# Patient Record
Sex: Female | Born: 1980 | Race: Black or African American | Hispanic: No | Marital: Married | Smoking: Former smoker
Health system: Southern US, Community
[De-identification: ages and names within clinical notes are randomized; demographics above are authoritative.]

## PROBLEM LIST (undated history)

## (undated) DIAGNOSIS — M6282 Rhabdomyolysis: Secondary | ICD-10-CM

## (undated) DIAGNOSIS — R42 Dizziness and giddiness: Secondary | ICD-10-CM

## (undated) DIAGNOSIS — I10 Essential (primary) hypertension: Secondary | ICD-10-CM

## (undated) HISTORY — DX: Rhabdomyolysis: M62.82

---

## 2006-03-22 ENCOUNTER — Other Ambulatory Visit: Admission: RE | Admit: 2006-03-22 | Discharge: 2006-03-22 | Payer: Self-pay | Admitting: Obstetrics and Gynecology

## 2006-05-22 ENCOUNTER — Emergency Department (HOSPITAL_COMMUNITY): Admission: EM | Admit: 2006-05-22 | Discharge: 2006-05-22 | Payer: Self-pay | Admitting: Emergency Medicine

## 2006-07-03 ENCOUNTER — Emergency Department (HOSPITAL_COMMUNITY): Admission: EM | Admit: 2006-07-03 | Discharge: 2006-07-03 | Payer: Self-pay | Admitting: *Deleted

## 2006-07-12 ENCOUNTER — Ambulatory Visit: Payer: Self-pay | Admitting: Internal Medicine

## 2006-07-17 ENCOUNTER — Ambulatory Visit: Payer: Self-pay | Admitting: Internal Medicine

## 2006-07-24 ENCOUNTER — Ambulatory Visit: Payer: Self-pay | Admitting: Gastroenterology

## 2006-12-20 ENCOUNTER — Emergency Department (HOSPITAL_COMMUNITY): Admission: EM | Admit: 2006-12-20 | Discharge: 2006-12-20 | Payer: Self-pay | Admitting: Emergency Medicine

## 2007-12-31 ENCOUNTER — Emergency Department (HOSPITAL_COMMUNITY): Admission: EM | Admit: 2007-12-31 | Discharge: 2007-12-31 | Payer: Self-pay | Admitting: Emergency Medicine

## 2008-02-14 ENCOUNTER — Emergency Department (HOSPITAL_COMMUNITY): Admission: EM | Admit: 2008-02-14 | Discharge: 2008-02-14 | Payer: Self-pay | Admitting: Emergency Medicine

## 2009-02-14 ENCOUNTER — Emergency Department (HOSPITAL_COMMUNITY): Admission: EM | Admit: 2009-02-14 | Discharge: 2009-02-14 | Payer: Self-pay | Admitting: Emergency Medicine

## 2009-03-09 ENCOUNTER — Emergency Department (HOSPITAL_COMMUNITY): Admission: EM | Admit: 2009-03-09 | Discharge: 2009-03-09 | Payer: Self-pay | Admitting: Emergency Medicine

## 2009-05-09 ENCOUNTER — Emergency Department (HOSPITAL_COMMUNITY): Admission: EM | Admit: 2009-05-09 | Discharge: 2009-05-09 | Payer: Self-pay | Admitting: Emergency Medicine

## 2009-05-11 ENCOUNTER — Emergency Department (HOSPITAL_COMMUNITY): Admission: EM | Admit: 2009-05-11 | Discharge: 2009-05-11 | Payer: Self-pay | Admitting: Emergency Medicine

## 2010-07-28 ENCOUNTER — Emergency Department (HOSPITAL_COMMUNITY): Admission: EM | Admit: 2010-07-28 | Discharge: 2010-07-28 | Payer: Self-pay | Admitting: Emergency Medicine

## 2010-10-18 ENCOUNTER — Emergency Department (HOSPITAL_COMMUNITY): Admission: EM | Admit: 2010-10-18 | Discharge: 2010-10-18 | Payer: Self-pay | Admitting: Emergency Medicine

## 2010-12-12 ENCOUNTER — Emergency Department (HOSPITAL_COMMUNITY)
Admission: EM | Admit: 2010-12-12 | Discharge: 2010-12-12 | Payer: Self-pay | Source: Home / Self Care | Admitting: Emergency Medicine

## 2010-12-18 LAB — URINALYSIS, ROUTINE W REFLEX MICROSCOPIC
Bilirubin Urine: NEGATIVE
Hgb urine dipstick: NEGATIVE
Ketones, ur: NEGATIVE mg/dL
Leukocytes, UA: NEGATIVE
Nitrite: NEGATIVE
Protein, ur: NEGATIVE mg/dL
Specific Gravity, Urine: 1.025 (ref 1.005–1.030)
Urine Glucose, Fasting: NEGATIVE mg/dL
Urobilinogen, UA: 1 mg/dL (ref 0.0–1.0)
pH: 7.5 (ref 5.0–8.0)

## 2010-12-18 LAB — DIFFERENTIAL
Basophils Absolute: 0 10*3/uL (ref 0.0–0.1)
Basophils Relative: 0 % (ref 0–1)
Eosinophils Absolute: 0.1 10*3/uL (ref 0.0–0.7)
Eosinophils Relative: 2 % (ref 0–5)
Lymphocytes Relative: 46 % (ref 12–46)
Lymphs Abs: 2.1 10*3/uL (ref 0.7–4.0)
Monocytes Absolute: 0.3 10*3/uL (ref 0.1–1.0)
Monocytes Relative: 7 % (ref 3–12)
Neutro Abs: 2.1 10*3/uL (ref 1.7–7.7)
Neutrophils Relative %: 46 % (ref 43–77)

## 2010-12-18 LAB — COMPREHENSIVE METABOLIC PANEL
ALT: 17 U/L (ref 0–35)
AST: 19 U/L (ref 0–37)
Albumin: 3.7 g/dL (ref 3.5–5.2)
Alkaline Phosphatase: 58 U/L (ref 39–117)
BUN: 7 mg/dL (ref 6–23)
CO2: 28 mEq/L (ref 19–32)
Calcium: 9.2 mg/dL (ref 8.4–10.5)
Chloride: 106 mEq/L (ref 96–112)
Creatinine, Ser: 0.75 mg/dL (ref 0.4–1.2)
GFR calc Af Amer: >60 mL/min (ref >60)
GFR calc non Af Amer: >60 mL/min (ref >60)
Glucose, Bld: 80 mg/dL (ref 70–99)
Potassium: 4 mEq/L (ref 3.5–5.1)
Sodium: 139 mEq/L (ref 135–145)
Total Bilirubin: 0.4 mg/dL (ref 0.3–1.2)
Total Protein: 7.3 g/dL (ref 6.0–8.3)

## 2010-12-18 LAB — CBC
HCT: 37.9 % (ref 36.0–46.0)
Hemoglobin: 12.5 g/dL (ref 12.0–15.0)
MCH: 31.8 pg (ref 26.0–34.0)
MCHC: 33 g/dL (ref 30.0–36.0)
MCV: 96.4 fL (ref 78.0–100.0)
Platelets: 190 10*3/uL (ref 150–400)
RBC: 3.93 MIL/uL (ref 3.87–5.11)
RDW: 13 % (ref 11.5–15.5)
WBC: 4.6 10*3/uL (ref 4.0–10.5)

## 2010-12-18 LAB — URINE MICROSCOPIC-ADD ON

## 2010-12-18 LAB — POCT PREGNANCY, URINE: Preg Test, Ur: NEGATIVE

## 2011-02-13 LAB — DIFFERENTIAL
Basophils Absolute: 0 10*3/uL (ref 0.0–0.1)
Basophils Relative: 1 % (ref 0–1)
Eosinophils Absolute: 0.1 10*3/uL (ref 0.0–0.7)
Eosinophils Relative: 3 % (ref 0–5)
Lymphocytes Relative: 55 % — ABNORMAL HIGH (ref 12–46)
Lymphs Abs: 1.7 10*3/uL (ref 0.7–4.0)
Monocytes Absolute: 0.4 10*3/uL (ref 0.1–1.0)
Monocytes Relative: 12 % (ref 3–12)
Neutro Abs: 0.9 10*3/uL — ABNORMAL LOW (ref 1.7–7.7)
Neutrophils Relative %: 29 % — ABNORMAL LOW (ref 43–77)

## 2011-02-13 LAB — URINALYSIS, ROUTINE W REFLEX MICROSCOPIC
Bilirubin Urine: NEGATIVE
Glucose, UA: NEGATIVE mg/dL
Ketones, ur: NEGATIVE mg/dL
Leukocytes, UA: NEGATIVE
Nitrite: NEGATIVE
Protein, ur: NEGATIVE mg/dL
Specific Gravity, Urine: 1.021 (ref 1.005–1.030)
Urobilinogen, UA: 1 mg/dL (ref 0.0–1.0)
pH: 6 (ref 5.0–8.0)

## 2011-02-13 LAB — CBC
HCT: 35.9 % — ABNORMAL LOW (ref 36.0–46.0)
Hemoglobin: 12.5 g/dL (ref 12.0–15.0)
MCH: 33 pg (ref 26.0–34.0)
MCHC: 34.7 g/dL (ref 30.0–36.0)
MCV: 95 fL (ref 78.0–100.0)
Platelets: 193 10*3/uL (ref 150–400)
RBC: 3.78 MIL/uL — ABNORMAL LOW (ref 3.87–5.11)
RDW: 13 % (ref 11.5–15.5)
WBC: 3.1 10*3/uL — ABNORMAL LOW (ref 4.0–10.5)

## 2011-02-13 LAB — POCT I-STAT, CHEM 8
BUN: 8 mg/dL (ref 6–23)
Calcium, Ion: 1.13 mmol/L (ref 1.12–1.32)
Chloride: 105 mEq/L (ref 96–112)
Creatinine, Ser: 0.8 mg/dL (ref 0.4–1.2)
Glucose, Bld: 89 mg/dL (ref 70–99)
HCT: 40 % (ref 36.0–46.0)
Hemoglobin: 13.6 g/dL (ref 12.0–15.0)
Potassium: 5.4 mEq/L — ABNORMAL HIGH (ref 3.5–5.1)
Sodium: 140 mEq/L (ref 135–145)
TCO2: 29 mmol/L (ref 0–100)

## 2011-02-13 LAB — URINE MICROSCOPIC-ADD ON

## 2011-02-13 LAB — PREGNANCY, URINE: Preg Test, Ur: NEGATIVE

## 2011-02-15 ENCOUNTER — Emergency Department (HOSPITAL_COMMUNITY)
Admission: EM | Admit: 2011-02-15 | Discharge: 2011-02-15 | Disposition: A | Discharge status: Home / Self Care | Payer: BC Managed Care – PPO | Attending: Emergency Medicine | Admitting: Emergency Medicine

## 2011-02-15 ENCOUNTER — Emergency Department (HOSPITAL_COMMUNITY): Payer: BC Managed Care – PPO

## 2011-02-15 DIAGNOSIS — M25569 Pain in unspecified knee: Secondary | ICD-10-CM | POA: Insufficient documentation

## 2011-02-15 DIAGNOSIS — D649 Anemia, unspecified: Secondary | ICD-10-CM | POA: Insufficient documentation

## 2011-02-15 DIAGNOSIS — Y92009 Unspecified place in unspecified non-institutional (private) residence as the place of occurrence of the external cause: Secondary | ICD-10-CM | POA: Insufficient documentation

## 2011-02-15 DIAGNOSIS — X500XXA Overexertion from strenuous movement or load, initial encounter: Secondary | ICD-10-CM | POA: Insufficient documentation

## 2011-03-12 LAB — URINALYSIS, ROUTINE W REFLEX MICROSCOPIC
Bilirubin Urine: NEGATIVE
Glucose, UA: NEGATIVE mg/dL
Hgb urine dipstick: NEGATIVE
Ketones, ur: NEGATIVE mg/dL
Nitrite: NEGATIVE
Protein, ur: NEGATIVE mg/dL
Specific Gravity, Urine: 1.017 (ref 1.005–1.030)
Urobilinogen, UA: 1 mg/dL (ref 0.0–1.0)
pH: 7.5 (ref 5.0–8.0)

## 2011-03-12 LAB — PREGNANCY, URINE: Preg Test, Ur: NEGATIVE

## 2011-03-12 LAB — WET PREP, GENITAL
Trich, Wet Prep: NONE SEEN
Yeast Wet Prep HPF POC: NONE SEEN

## 2011-03-12 LAB — GC/CHLAMYDIA PROBE AMP, GENITAL
Chlamydia, DNA Probe: NEGATIVE
GC Probe Amp, Genital: NEGATIVE

## 2011-03-12 LAB — HCG, QUANTITATIVE, PREGNANCY: hCG, Beta Chain, Quant, S: <2 m[IU]/mL (ref <5)

## 2011-03-14 LAB — RPR: RPR Ser Ql: NONREACTIVE

## 2011-03-14 LAB — GC/CHLAMYDIA PROBE AMP, GENITAL
Chlamydia, DNA Probe: NEGATIVE
GC Probe Amp, Genital: NEGATIVE

## 2011-03-14 LAB — URINALYSIS, ROUTINE W REFLEX MICROSCOPIC
Bilirubin Urine: NEGATIVE
Glucose, UA: NEGATIVE mg/dL
Hgb urine dipstick: NEGATIVE
Ketones, ur: NEGATIVE mg/dL
Nitrite: NEGATIVE
Protein, ur: NEGATIVE mg/dL
Specific Gravity, Urine: 1.022 (ref 1.005–1.030)
Urobilinogen, UA: 1 mg/dL (ref 0.0–1.0)
pH: 7 (ref 5.0–8.0)

## 2011-03-14 LAB — POCT PREGNANCY, URINE: Preg Test, Ur: NEGATIVE

## 2011-03-14 LAB — WET PREP, GENITAL
Trich, Wet Prep: NONE SEEN
Yeast Wet Prep HPF POC: NONE SEEN

## 2011-04-20 NOTE — Assessment & Plan Note (Signed)
[facility]                           GASTROENTEROLOGY OFFICE NOTE   Kayla Adkins, Kayla Adkins                      MRN:          [id number]  DATE:07/13/2006                            DOB:          1981-10-02    REFERRING PHYSICIAN:  Celene Kras, MD   ASSESSMENT:  Recurrent vomiting and postprandial epigastric pain x6 to 8  weeks. Etiology is not clear. Pregnancy is excluded with a negative  pregnancy test. Laboratory studies on July 03, 2006 are unrevealing except  for a slight anemia with a hemoglobin of 11.7. Her B-met was normal. White  count was normal. Her urinalysis was negative. Etiologies include peptic  ulcer disease, gallbladder disease in the differential but she has more of  an immediate postprandial vomiting problem, she had similar problems 2 years  ago but she had bloody diarrhea at the time. She was admitted at [facility] in [place] and apparently had a CT scan and was told she  had a bleeding ulcer, though she never had endoscopy. We will request those  records.   PLAN:  1. Schedule upper GI endoscopy.  2. Check hepatic function panel, amylase, and lipase today as pancreatitis      and biliary disease are in the differential.  3. I have explained the recommendations and plans to the patient and she      understands the risks, benefits, and indications of upper endoscopy.  4. She has been given a prescription for Omeprazole 20 mg daily but has      not started it. I told her to go ahead and start that.   HISTORY OF PRESENT ILLNESS:  This 29 year old African-American woman has  been sick for about 6 to 8 weeks. Every time she eats, she develops nausea  and abdominal cramping. There is epigastric pain. There is some right sided  abdominal pain in the lower quadrant as well. She has had some loose stools  lately without significant urgency or nocturnal symptoms. There may have  been some fever and night sweats, though  it is not entirely clear. She has  not had any weight loss or gain. She has been afraid to eat because of the  symptoms. Laboratory testing in the emergency department as above on July 03, 2006. She was Hemoccult negative at that time as well. She was diagnosed  as having a gastritis.   MEDICATIONS:  As above. She has also been given promethazine 25 mg as  needed.   PAST MEDICAL HISTORY:  As above. She has also had a cesarean section.   FAMILY HISTORY:  Grandparents have diabetes and heart disease. There is no  GI problems reported.   SOCIAL HISTORY:  She is single. She has 1 son and 2 daughters. Occasional  alcohol.   REVIEW OF SYSTEMS:  As above. She has had some headaches, though these  started after these symptoms and are not related. Though, they are  associated with some of them, they do not perceive this problem. There is  some back pain, some insomnia.   PHYSICAL EXAMINATION:  GENERAL:  Examination reveals  an overweight to obese  young black woman in no acute distress.  VITAL SIGNS:  Height 5 foot 3. Weight 154 pounds. Pulse 60. Blood pressure  118/62.  HEENT:  Eyes anicteric. ENT, normal posterior pharynx.  NECK:  Supple without mass.  CHEST:  Clear.  HEART:  S1 and S2 without murmur, rub, or gallop.  ABDOMEN:  Soft, nontender, without organomegaly or mass.  RECTAL:  Examination in the presence of female nursing staff demonstrates  heme negative, formed brown stool. No mass.  EXTREMITIES:  No edema.  SKIN:  No rash.  NEUROLOGIC:  Alert and oriented times three.   I have reviewed the emergency department records on line in e-chart, as well  as the laboratory testing as described above.   I appreciate the opportunity to care for this patient.                                   Iva Boop, MD, [facility]   CEG/MedQ  DD:  07/13/2006  DT:  07/13/2006  Job #:  [id number]   cc:   Celene Kras, MD

## 2011-04-20 NOTE — Procedures (Signed)
[facility]                            ABDOMINAL ULTRASOUND REPORT   ANTARA, BRECHEISEN                      MRN:          [id number]  DATE:07/26/2006                            DOB:          1981-06-04    ACCESSION:  # [id number]   READING PHYSICIAN:  Vania Rea. Jarold Motto, MD, Clementeen Graham, FACP   PROCEDURE:  Multiplanar abdominal ultrasound imaging was performed in the  upright, supine, right and left lateral decubitus positions.   RESULTS:  The abdominal aorta appears normal.  The maximal diameter is 1.8  cm.  The IVC is patent.   The pancreas is normal.   The gallbladder has normal wall thickness of 2.2 mm.  Bile duct normal.  Diameter is 3.8 mm.  The liver is normal.   The kidneys are normal, the right is 9 cm and left 9.8 cm.   The spleen is normal at 9 cm.   ASSESSMENT:  This is technically an excellent sonogram examination.  There  is no evidence of cholelithiasis or biliary ductal dilatation.  The liver,  pancreas and kidneys are well-imaged and all appeared entirely normal.                                   Vania Rea. Jarold Motto, MD, Clementeen Graham, [place]   DRP/MedQ  DD:  07/26/2006  DT:  07/26/2006  Job #:  [id number]

## 2011-08-23 LAB — URINALYSIS, ROUTINE W REFLEX MICROSCOPIC
Glucose, UA: NEGATIVE
pH: 6.5

## 2012-07-07 ENCOUNTER — Encounter (HOSPITAL_COMMUNITY): Payer: Self-pay | Admitting: *Deleted

## 2012-07-07 ENCOUNTER — Emergency Department (HOSPITAL_COMMUNITY)
Admission: EM | Admit: 2012-07-07 | Discharge: 2012-07-07 | Disposition: A | Discharge status: Home / Self Care | Payer: Self-pay | Attending: Emergency Medicine | Admitting: Emergency Medicine

## 2012-07-07 DIAGNOSIS — F17200 Nicotine dependence, unspecified, uncomplicated: Secondary | ICD-10-CM | POA: Insufficient documentation

## 2012-07-07 DIAGNOSIS — A599 Trichomoniasis, unspecified: Secondary | ICD-10-CM | POA: Insufficient documentation

## 2012-07-07 DIAGNOSIS — R197 Diarrhea, unspecified: Secondary | ICD-10-CM | POA: Insufficient documentation

## 2012-07-07 DIAGNOSIS — R42 Dizziness and giddiness: Secondary | ICD-10-CM | POA: Insufficient documentation

## 2012-07-07 LAB — URINALYSIS, ROUTINE W REFLEX MICROSCOPIC
Nitrite: NEGATIVE
Specific Gravity, Urine: 1.033 — ABNORMAL HIGH (ref 1.005–1.030)
pH: 6.5 (ref 5.0–8.0)

## 2012-07-07 LAB — COMPREHENSIVE METABOLIC PANEL
AST: 18 U/L (ref 0–37)
Albumin: 4.1 g/dL (ref 3.5–5.2)
Chloride: 103 mEq/L (ref 96–112)
Creatinine, Ser: 0.73 mg/dL (ref 0.50–1.10)
Potassium: 3.9 mEq/L (ref 3.5–5.1)
Total Bilirubin: 0.3 mg/dL (ref 0.3–1.2)

## 2012-07-07 LAB — LIPASE, BLOOD: Lipase: 23 U/L (ref 11–59)

## 2012-07-07 LAB — CBC WITH DIFFERENTIAL/PLATELET
Basophils Absolute: 0 10*3/uL (ref 0.0–0.1)
Basophils Relative: 0 % (ref 0–1)
Eosinophils Absolute: 0 10*3/uL (ref 0.0–0.7)
Eosinophils Relative: 1 % (ref 0–5)
HCT: 37.3 % (ref 36.0–46.0)
Hemoglobin: 12.6 g/dL (ref 12.0–15.0)
Lymphocytes Relative: 52 % — ABNORMAL HIGH (ref 12–46)
Lymphs Abs: 2.2 10*3/uL (ref 0.7–4.0)
MCH: 31.9 pg (ref 26.0–34.0)
MCHC: 33.8 g/dL (ref 30.0–36.0)
MCV: 94.4 fL (ref 78.0–100.0)
Monocytes Absolute: 0.3 10*3/uL (ref 0.1–1.0)
Monocytes Relative: 7 % (ref 3–12)
Neutro Abs: 1.7 10*3/uL (ref 1.7–7.7)
Neutrophils Relative %: 41 % — ABNORMAL LOW (ref 43–77)
Platelets: 199 10*3/uL (ref 150–400)
RBC: 3.95 MIL/uL (ref 3.87–5.11)
RDW: 12.7 % (ref 11.5–15.5)
WBC: 4.3 10*3/uL (ref 4.0–10.5)

## 2012-07-07 LAB — WET PREP, GENITAL: Yeast Wet Prep HPF POC: NONE SEEN

## 2012-07-07 LAB — POCT PREGNANCY, URINE: Preg Test, Ur: NEGATIVE

## 2012-07-07 LAB — URINE MICROSCOPIC-ADD ON

## 2012-07-07 MED ORDER — MECLIZINE HCL 50 MG PO TABS: 25.0000 mg | ORAL_TABLET | Freq: Three times a day (TID) | ORAL | Status: AC | PRN

## 2012-07-07 MED ORDER — AZITHROMYCIN 250 MG PO TABS
1000.0000 mg | ORAL_TABLET | Freq: Once | ORAL | Status: AC
  Administered 2012-07-07: 1000 mg via ORAL
  Filled 2012-07-07: qty 4

## 2012-07-07 MED ORDER — SODIUM CHLORIDE 0.9 % IV BOLUS (SEPSIS)
1000.0000 mL | Freq: Once | INTRAVENOUS | Status: AC
  Administered 2012-07-07: 1000 mL via INTRAVENOUS

## 2012-07-07 MED ORDER — CEFTRIAXONE SODIUM 250 MG IJ SOLR
250.0000 mg | Freq: Once | INTRAMUSCULAR | Status: AC
  Administered 2012-07-07: 250 mg via INTRAMUSCULAR
  Filled 2012-07-07: qty 250

## 2012-07-07 MED ORDER — ONDANSETRON HCL 4 MG/2ML IJ SOLN
4.0000 mg | Freq: Once | INTRAMUSCULAR | Status: AC
  Administered 2012-07-07: 4 mg via INTRAVENOUS
  Filled 2012-07-07: qty 2

## 2012-07-07 MED ORDER — LOPERAMIDE HCL 2 MG PO CAPS: 2.0000 mg | ORAL_CAPSULE | Freq: Four times a day (QID) | ORAL | Status: AC | PRN

## 2012-07-07 MED ORDER — MECLIZINE HCL 25 MG PO TABS
25.0000 mg | ORAL_TABLET | Freq: Once | ORAL | Status: AC
  Administered 2012-07-07: 25 mg via ORAL
  Filled 2012-07-07: qty 1

## 2012-07-07 MED ORDER — METRONIDAZOLE 500 MG PO TABS
2000.0000 mg | ORAL_TABLET | Freq: Once | ORAL | Status: AC
  Administered 2012-07-07: 2000 mg via ORAL
  Filled 2012-07-07: qty 4

## 2012-07-07 NOTE — ED Notes (Signed)
Pt reports dizziness and diarrhea x3 weeks. Diarrhea 4-5x a day. Decreased intake, dry mouth.

## 2012-07-07 NOTE — Progress Notes (Signed)
[facility] ED CM spoke with pt when noted no pcp Pt confirms she has no pcp Cm reviewed with pt how to get an in network pcp through [employer] or toll free number Pt states [employer] through husband whom she no longer lives with.  Pt reports having medicaid previously and is attempting to re apply for medicaid again by end of year Pt voiced appreciation and understanding of information reviewed

## 2012-07-07 NOTE — ED Notes (Signed)
Pt states for past 3 weeks has been having dizziness, diarrhea (5 times a day), nausea and vomiting (states more like spit). Pt states having abdominal pain, 7/10, states feels like she has a knot in upper abdominal area, then on R side feels "thumping".

## 2012-07-07 NOTE — ED Provider Notes (Signed)
History     CSN: [id number]  Arrival date & time 07/07/12  1303   First MD Initiated Contact with Patient 07/07/12 1420      Chief Complaint  Patient presents with  . Dizziness  . Diarrhea    (Consider location/radiation/quality/duration/timing/severity/associated sxs/prior treatment) HPI  31 year old female with no significant medical history presents complaining of diarrhea.patient reports for the past 3 weeks she has been having persistent diarrhea. Initially she was experiencing easiness with sensation of room spinning around. She was nauseated and vomits a few times. Since then she has been having 4-5 bouts of non bloody, non mucus diarrhea daily.  She has decreased in appetite.  Has occasional epigastric abd tenderness, which usually improves when she pressed on it.  Has not tried anything to alleviate her sxs because "i don't like to take medicine".  She also report her LMP is in May and has missed her last few menstruations.  Does not think she's pregnant. Is sexually active only to her husband.  Denies fever, chills, cp, sob, back pain, or urinary sxs.  Denies vaginal discharge or rash.  Denies any recent abx use or recent travel.    History reviewed. No pertinent past medical history.  Past Surgical History  Procedure Date  . Cesarean section     No family history on file.  History  Substance Use Topics  . Smoking status: Current Some Day Smoker  . Smokeless tobacco: Not on file  . Alcohol Use: Yes     sometimes    OB History    Grav Para Term Preterm Abortions TAB SAB Ect Mult Living                  Review of Systems  All other systems reviewed and are negative.    Allergies  Review of patient's allergies indicates no known allergies.  Home Medications  No current outpatient prescriptions on file.  LMP 04/25/2012  Physical Exam  Nursing note and vitals reviewed. Constitutional: She is oriented to person, place, and time. She appears well-developed  and well-nourished. No distress.       Awake, alert, nontoxic appearance  HENT:  Head: Atraumatic.  Eyes: Conjunctivae are normal. Right eye exhibits no discharge. Left eye exhibits no discharge.       fatigueable horizontal nystagmus.    Neck: Neck supple.  Cardiovascular: Normal rate and regular rhythm.   Pulmonary/Chest: Effort normal. No respiratory distress. She exhibits no tenderness.  Abdominal: Soft. There is no tenderness. There is no rigidity, no rebound, no guarding, no CVA tenderness, no tenderness at McBurney's point and negative Murphy's sign. Hernia confirmed negative in the right inguinal area and confirmed negative in the left inguinal area.  Genitourinary: Vagina normal and uterus normal. There is no rash or tenderness on the right labia. There is no rash or tenderness on the left labia. Uterus is not tender. Cervix exhibits no motion tenderness. Right adnexum displays no tenderness and no fullness. Left adnexum displays no tenderness and no fullness. No erythema, tenderness or bleeding around the vagina. No vaginal discharge found.       Chaperone present  Musculoskeletal: Normal range of motion. She exhibits no edema and no tenderness.       ROM appears intact, no obvious focal weakness  Neurological: She is alert and oriented to person, place, and time.       Mental status and motor strength appears intact  Skin: Skin is warm. No rash noted.  Psychiatric: She has  a normal mood and affect.    ED Course  Procedures (including critical care time)  Labs Reviewed - No data to display No results found.   No diagnosis found.  1. Persistent diarrhea 2. trichomoniasis  MDM  Persistent diarrhea and dizziness.  Abd non surgical.  Has fatigueable horizontal nystagmus but normal gait and in NAD.  Will check labs, give IVF, meclizine and continue to monitor.     3:29 PM UA shows 7-10 WBC, nitrite negative.  Pt denies having any urinary sxs.  Pt also has trichomonas present  in her UA.  She reports being sexually active only to her husband.  Will perform pelvic exam for further evaluation.     4:24 PM Pelvic exam is unremarkable.  No CMT.  However, pt request to be treated for STD although she denies prior hx of STD.  Will give Flagyl 2g for trichomonas, Rocephin 250mg  IM, and Zithromax 1000mg  PO.  GC/Chlamydia culture sent.    Pt has not had any diarrhea here in ED.      Fayrene Helper, PA-C 07/07/12 1711

## 2012-07-09 NOTE — ED Provider Notes (Signed)
Medical screening examination/treatment/procedure(s) were performed by non-physician practitioner and as supervising physician I was immediately available for consultation/collaboration.   Yailyn Strack W. Cyril Woodmansee, MD 07/09/12 1133 

## 2013-04-01 ENCOUNTER — Emergency Department (HOSPITAL_COMMUNITY)
Admission: EM | Admit: 2013-04-01 | Discharge: 2013-04-01 | Disposition: A | Discharge status: Home / Self Care | Payer: Medicaid Other | Attending: Emergency Medicine | Admitting: Emergency Medicine

## 2013-04-01 ENCOUNTER — Encounter (HOSPITAL_COMMUNITY): Payer: Self-pay | Admitting: Emergency Medicine

## 2013-04-01 DIAGNOSIS — Z3202 Encounter for pregnancy test, result negative: Secondary | ICD-10-CM | POA: Insufficient documentation

## 2013-04-01 DIAGNOSIS — R51 Headache: Secondary | ICD-10-CM | POA: Insufficient documentation

## 2013-04-01 DIAGNOSIS — I10 Essential (primary) hypertension: Secondary | ICD-10-CM | POA: Insufficient documentation

## 2013-04-01 DIAGNOSIS — B349 Viral infection, unspecified: Secondary | ICD-10-CM

## 2013-04-01 DIAGNOSIS — R197 Diarrhea, unspecified: Secondary | ICD-10-CM | POA: Insufficient documentation

## 2013-04-01 DIAGNOSIS — B9789 Other viral agents as the cause of diseases classified elsewhere: Secondary | ICD-10-CM | POA: Insufficient documentation

## 2013-04-01 DIAGNOSIS — F17200 Nicotine dependence, unspecified, uncomplicated: Secondary | ICD-10-CM | POA: Insufficient documentation

## 2013-04-01 DIAGNOSIS — IMO0001 Reserved for inherently not codable concepts without codable children: Secondary | ICD-10-CM | POA: Insufficient documentation

## 2013-04-01 DIAGNOSIS — R112 Nausea with vomiting, unspecified: Secondary | ICD-10-CM | POA: Insufficient documentation

## 2013-04-01 LAB — URINALYSIS, ROUTINE W REFLEX MICROSCOPIC
Ketones, ur: 15 mg/dL — AB
Protein, ur: NEGATIVE mg/dL
Urobilinogen, UA: 1 mg/dL (ref 0.0–1.0)

## 2013-04-01 LAB — URINE MICROSCOPIC-ADD ON

## 2013-04-01 LAB — BASIC METABOLIC PANEL
Calcium: 9.5 mg/dL (ref 8.4–10.5)
Creatinine, Ser: 0.69 mg/dL (ref 0.50–1.10)
GFR calc non Af Amer: >90 mL/min (ref >90)
Sodium: 135 mEq/L (ref 135–145)

## 2013-04-01 LAB — CBC WITH DIFFERENTIAL/PLATELET
Basophils Absolute: 0 10*3/uL (ref 0.0–0.1)
Eosinophils Absolute: 0 10*3/uL (ref 0.0–0.7)
Eosinophils Relative: 0 % (ref 0–5)
MCH: 32.2 pg (ref 26.0–34.0)
MCHC: 34.3 g/dL (ref 30.0–36.0)
MCV: 93.7 fL (ref 78.0–100.0)
Monocytes Absolute: 0.6 10*3/uL (ref 0.1–1.0)
Platelets: 208 10*3/uL (ref 150–400)
RDW: 13 % (ref 11.5–15.5)

## 2013-04-01 LAB — POCT PREGNANCY, URINE: Preg Test, Ur: NEGATIVE

## 2013-04-01 MED ORDER — DICYCLOMINE HCL 20 MG PO TABS: 20.0000 mg | ORAL_TABLET | Freq: Two times a day (BID) | ORAL | Status: DC

## 2013-04-01 MED ORDER — HYDROMORPHONE HCL PF 1 MG/ML IJ SOLN
0.5000 mg | Freq: Once | INTRAMUSCULAR | Status: AC
  Administered 2013-04-01: 0.5 mg via INTRAVENOUS
  Filled 2013-04-01: qty 1

## 2013-04-01 MED ORDER — DEXTROMETHORPHAN POLISTIREX 30 MG/5ML PO LQCR
10.0000 mL | Freq: Once | ORAL | Status: AC
  Administered 2013-04-01: 60 mg via ORAL
  Filled 2013-04-01: qty 10

## 2013-04-01 MED ORDER — SODIUM CHLORIDE 0.9 % IV BOLUS (SEPSIS)
1000.0000 mL | Freq: Once | INTRAVENOUS | Status: AC
  Administered 2013-04-01: 1000 mL via INTRAVENOUS

## 2013-04-01 MED ORDER — ONDANSETRON HCL 4 MG/2ML IJ SOLN
4.0000 mg | Freq: Once | INTRAMUSCULAR | Status: AC
  Administered 2013-04-01: 4 mg via INTRAVENOUS
  Filled 2013-04-01: qty 2

## 2013-04-01 MED ORDER — ONDANSETRON 4 MG PO TBDP: 4.0000 mg | ORAL_TABLET | Freq: Three times a day (TID) | ORAL | Status: DC | PRN

## 2013-04-01 NOTE — ED Notes (Addendum)
Per EMS: Pt c/o nausea, vomiting, and headache. Pt denies abdominal pain or syncopal episode. EMS found pt to be hypertensive upon arrival, 200/116 manually. Pt reports that this is the first time she has ever known her BP to be elevated, however pt has familial history of hypertension.  EMS gave 4 mg of Zofran in route.

## 2013-04-01 NOTE — ED Notes (Signed)
GMW:[id number]<OZ> Expected date:<BR> Expected time:<BR> Means of arrival:<BR> Comments:<BR> N/v/d-hypertension

## 2013-04-01 NOTE — ED Provider Notes (Signed)
History     CSN: [id number]  Arrival date & time 04/01/13  1440   First MD Initiated Contact with Patient 04/01/13 1518      Chief Complaint  Patient presents with  . Hypertension  . Weakness  . Emesis    (Consider location/radiation/quality/duration/timing/severity/associated sxs/prior treatment) HPI Comments: Patient is a 32 year old female with no significant past medical history who presents with nausea, vomiting and diarrhea since last night. Symptoms started suddenly and remained constant since the onset. Patient reports associated headache and myalgias as of today. Patient did not try anything for symptoms. No aggravating/alleviating factors. No known sick contacts.   Patient is a 32 y.o. female presenting with hypertension, weakness, and vomiting.  Hypertension Associated symptoms include headaches, nausea, vomiting and weakness.  Weakness Associated symptoms include headaches, nausea, vomiting and weakness.  Emesis Associated symptoms: diarrhea and headaches     History reviewed. No pertinent past medical history.  Past Surgical History  Procedure Laterality Date  . Cesarean section      No family history on file.  History  Substance Use Topics  . Smoking status: Current Some Day Smoker -- 0.25 packs/day for 2 years    Types: Cigarettes  . Smokeless tobacco: Never Used  . Alcohol Use: Yes     Comment: sometimes    OB History   Grav Para Term Preterm Abortions TAB SAB Ect Mult Living                  Review of Systems  Gastrointestinal: Positive for nausea, vomiting and diarrhea.  Neurological: Positive for weakness and headaches.  All other systems reviewed and are negative.    Allergies  Review of patient's allergies indicates no known allergies.  Home Medications  No current outpatient prescriptions on file.  BP 143/92  Pulse 90  Temp(Src) 99.9 F (37.7 C) (Oral)  Resp 18  SpO2 99%  LMP 03/01/2013  Physical Exam  Nursing note and  vitals reviewed. Constitutional: She is oriented to person, place, and time. She appears well-developed and well-nourished. No distress.  HENT:  Head: Normocephalic and atraumatic.  Eyes: Conjunctivae and EOM are normal. Pupils are equal, round, and reactive to light. No scleral icterus.  Neck: Normal range of motion. Neck supple.  Cardiovascular: Normal rate and regular rhythm.  Exam reveals no gallop and no friction rub.   No murmur heard. Pulmonary/Chest: Effort normal and breath sounds normal. She has no wheezes. She has no rales. She exhibits no tenderness.  Abdominal: Soft. She exhibits no distension. There is no tenderness. There is no rebound and no guarding.  Musculoskeletal: Normal range of motion.  Neurological: She is alert and oriented to person, place, and time. Coordination normal.  Speech is goal-oriented. Moves limbs without ataxia.   Skin: Skin is warm and dry.  Psychiatric: She has a normal mood and affect. Her behavior is normal.    ED Course  Procedures (including critical care time)  Labs Reviewed  CBC WITH DIFFERENTIAL - Abnormal; Notable for the following:    Lymphs Abs 0.6 (*)    Monocytes Relative 14 (*)    All other components within normal limits  URINALYSIS, ROUTINE W REFLEX MICROSCOPIC - Abnormal; Notable for the following:    APPearance CLOUDY (*)    Hgb urine dipstick SMALL (*)    Ketones, ur 15 (*)    All other components within normal limits  URINE MICROSCOPIC-ADD ON - Abnormal; Notable for the following:    Squamous Epithelial /  LPF FEW (*)    All other components within normal limits  BASIC METABOLIC PANEL  POCT PREGNANCY, URINE   No results found.   1. Viral illness       MDM  4:01 PM Labs and urinalysis pending. Patient will have fluids, zofran, and dilaudid for symptoms. Vitals stable and patient afebrile.   8:12 PM Labs and urinalysis unremarkable. Patient likely experiencing viral illness. Patient has stable vitals and is  afebrile. Patient will be discharged with symptomatic treatment. Patient instructed to return with worsening or concerning symptoms.   Emilia Beck, PA-C 04/01/13 2016

## 2013-04-01 NOTE — ED Notes (Signed)
Pt. Requested to have something to drink.  Sprite was given. Nurse was notified

## 2013-04-01 NOTE — ED Notes (Signed)
Pt. Unable to urinate at this time. Nurse was notified.   

## 2013-04-02 NOTE — ED Provider Notes (Signed)
Medical screening examination/treatment/procedure(s) were performed by non-physician practitioner and as supervising physician I was immediately available for consultation/collaboration.   Loren Racer, MD 04/02/13 [phone]

## 2013-04-05 ENCOUNTER — Encounter (HOSPITAL_COMMUNITY): Payer: Self-pay | Admitting: Emergency Medicine

## 2013-04-05 ENCOUNTER — Emergency Department (HOSPITAL_COMMUNITY)
Admission: EM | Admit: 2013-04-05 | Discharge: 2013-04-05 | Disposition: A | Discharge status: Home / Self Care | Payer: Medicaid Other | Attending: Emergency Medicine | Admitting: Emergency Medicine

## 2013-04-05 DIAGNOSIS — R05 Cough: Secondary | ICD-10-CM | POA: Insufficient documentation

## 2013-04-05 DIAGNOSIS — Z79899 Other long term (current) drug therapy: Secondary | ICD-10-CM | POA: Insufficient documentation

## 2013-04-05 DIAGNOSIS — R059 Cough, unspecified: Secondary | ICD-10-CM | POA: Insufficient documentation

## 2013-04-05 DIAGNOSIS — F17200 Nicotine dependence, unspecified, uncomplicated: Secondary | ICD-10-CM | POA: Insufficient documentation

## 2013-04-05 DIAGNOSIS — R630 Anorexia: Secondary | ICD-10-CM | POA: Insufficient documentation

## 2013-04-05 DIAGNOSIS — J069 Acute upper respiratory infection, unspecified: Secondary | ICD-10-CM | POA: Insufficient documentation

## 2013-04-05 DIAGNOSIS — R5381 Other malaise: Secondary | ICD-10-CM | POA: Insufficient documentation

## 2013-04-05 DIAGNOSIS — F3289 Other specified depressive episodes: Secondary | ICD-10-CM | POA: Insufficient documentation

## 2013-04-05 DIAGNOSIS — F329 Major depressive disorder, single episode, unspecified: Secondary | ICD-10-CM | POA: Insufficient documentation

## 2013-04-05 DIAGNOSIS — R5383 Other fatigue: Secondary | ICD-10-CM

## 2013-04-05 LAB — POCT I-STAT, CHEM 8
Calcium, Ion: 1.19 mmol/L (ref 1.12–1.23)
Chloride: 101 mEq/L (ref 96–112)
Creatinine, Ser: 0.8 mg/dL (ref 0.50–1.10)
Glucose, Bld: 102 mg/dL — ABNORMAL HIGH (ref 70–99)
HCT: 44 % (ref 36.0–46.0)
Hemoglobin: 15 g/dL (ref 12.0–15.0)
Potassium: 3.2 mEq/L — ABNORMAL LOW (ref 3.5–5.1)

## 2013-04-05 LAB — T4, FREE: Free T4: 1.2 ng/dL (ref 0.80–1.80)

## 2013-04-05 LAB — TSH: TSH: 1.773 u[IU]/mL (ref 0.350–4.500)

## 2013-04-05 MED ORDER — GUAIFENESIN-CODEINE 100-10 MG/5ML PO SYRP: 5.0000 mL | ORAL_SOLUTION | Freq: Three times a day (TID) | ORAL | Status: DC | PRN

## 2013-04-05 NOTE — ED Provider Notes (Signed)
Medical screening examination/treatment/procedure(s) were performed by non-physician practitioner and as supervising physician I was immediately available for consultation/collaboration.  Flint Melter, MD 04/05/13 [phone]

## 2013-04-05 NOTE — ED Notes (Signed)
Pt states she was seen earlier this week and was discharged for the flu.  Pt states that she has generalized body pain, cough, dizziness and weakness.  Pt states she feels the same as when she was seen earlier.  Pt states she has no appetite, denies n/v/d.

## 2013-04-05 NOTE — ED Provider Notes (Signed)
History     CSN: [id number]  Arrival date & time 04/05/13  1036   First MD Initiated Contact with Patient 04/05/13 1100      Chief Complaint  Patient presents with  . Fatigue  . Cough    (Consider location/radiation/quality/duration/timing/severity/associated sxs/prior treatment) HPI  She presents to the emergency department with complaints of feeling fatigued and having loss of appetite. She was seen one week ago for the same and diagnosed with a viral illness he she was seen around the same time last year for the same complaints as well. Patient says that she just established a primary care doctor who she will see for the first time next week. She has had a little bit of weight gain, no loss of hair, husband denies her having apneic episodes and her sleep, she does snore. The patient does endorse having some low grade chronic depression she never thought about before. She is not pregnant and has had her glucose checked and it is normal. Patient says she does not know why she feels tired all the time.  History reviewed. No pertinent past medical history.  Past Surgical History  Procedure Laterality Date  . Cesarean section      History reviewed. No pertinent family history.  History  Substance Use Topics  . Smoking status: Current Some Day Smoker -- 0.25 packs/day for 2 years    Types: Cigarettes  . Smokeless tobacco: Never Used  . Alcohol Use: Yes     Comment: sometimes    OB History   Grav Para Term Preterm Abortions TAB SAB Ect Mult Living                  Review of Systems  Constitutional: Positive for appetite change and fatigue.  Respiratory: Positive for cough.   All other systems reviewed and are negative.    Allergies  Review of patient's allergies indicates no known allergies.  Home Medications   Current Outpatient Rx  Name  Route  Sig  Dispense  Refill  . dicyclomine (BENTYL) 20 MG tablet   Oral   Take 1 tablet (20 mg total) by mouth 2 (two)  times daily.   20 tablet   0   . guaiFENesin-codeine (ROBITUSSIN AC) 100-10 MG/5ML syrup   Oral   Take 5 mLs by mouth 3 (three) times daily as needed for cough.   120 mL   0   . ondansetron (ZOFRAN ODT) 4 MG disintegrating tablet   Oral   Take 1 tablet (4 mg total) by mouth every 8 (eight) hours as needed for nausea.   10 tablet   0     BP 142/99  Pulse 109  Temp(Src) 98.7 F (37.1 C) (Oral)  Resp 16  SpO2 100%  LMP 03/01/2013  Physical Exam  Nursing note and vitals reviewed. Constitutional: She appears well-developed and well-nourished. No distress.  HENT:  Head: Normocephalic and atraumatic.  Eyes: Pupils are equal, round, and reactive to light.  Neck: Normal range of motion. Neck supple.  Cardiovascular: Normal rate and regular rhythm.   Pulmonary/Chest: Effort normal.  Abdominal: Soft.  Neurological: She is alert.  Skin: Skin is warm and dry.    ED Course  Procedures (including critical care time)  Labs Reviewed  POCT I-STAT, CHEM 8 - Abnormal; Notable for the following:    Potassium 3.2 (*)    BUN 5 (*)    Glucose, Bld 102 (*)    All other components within normal limits  GLUCOSE, CAPILLARY  TSH  T4, FREE   No results found.   1. Fatigue   2. URI (upper respiratory infection)       MDM  Patient has chronic fatigue. It does not appear that she is anemic or pregnant. Her glucose is normal.  Differential diagnosis: possible depression, hypothyroidism, sleep apnea  Will check some basic labs including a thyroid. The patient's physical exam is normal. If I do not find anything to treat I will have patient followup with her primary care doctor in one month and they can continue testing at that time.     TSH and Free T are sent out to another lab. Pt will call back for results. Will Rx cough medication and she is to follo0w-up at her already scheduled appointment next week.  Pt has been advised of the symptoms that warrant their return to the  ED. Patient has voiced understanding and has agreed to follow-up with the PCP or specialist.     Dorthula Matas, PA-C 04/05/13 1326

## 2014-07-16 ENCOUNTER — Encounter (HOSPITAL_COMMUNITY): Payer: Self-pay | Admitting: Emergency Medicine

## 2014-07-16 ENCOUNTER — Emergency Department (HOSPITAL_COMMUNITY)
Admission: EM | Admit: 2014-07-16 | Discharge: 2014-07-16 | Disposition: A | Discharge status: Home / Self Care | Payer: BC Managed Care – PPO | Attending: Emergency Medicine | Admitting: Emergency Medicine

## 2014-07-16 DIAGNOSIS — K047 Periapical abscess without sinus: Secondary | ICD-10-CM | POA: Insufficient documentation

## 2014-07-16 DIAGNOSIS — K089 Disorder of teeth and supporting structures, unspecified: Secondary | ICD-10-CM | POA: Insufficient documentation

## 2014-07-16 DIAGNOSIS — F17200 Nicotine dependence, unspecified, uncomplicated: Secondary | ICD-10-CM | POA: Insufficient documentation

## 2014-07-16 DIAGNOSIS — K0889 Other specified disorders of teeth and supporting structures: Secondary | ICD-10-CM

## 2014-07-16 MED ORDER — AMOXICILLIN 500 MG PO CAPS: 500.0000 mg | ORAL_CAPSULE | Freq: Three times a day (TID) | ORAL | Status: DC

## 2014-07-16 MED ORDER — AMOXICILLIN 500 MG PO CAPS
500.0000 mg | ORAL_CAPSULE | Freq: Once | ORAL | Status: AC
  Administered 2014-07-16: 500 mg via ORAL
  Filled 2014-07-16: qty 1

## 2014-07-16 NOTE — ED Notes (Signed)
Pt c/o pain to right lower back tooth since Thursday am.

## 2014-07-16 NOTE — Discharge Instructions (Signed)
Please follow up with a dentist for continued evaluation and treatment.   Dental Pain Toothache is pain in or around a tooth. It may get worse with chewing or with cold or heat.  HOME CARE  Your dentist may use a numbing medicine during treatment. If so, you may need to avoid eating until the medicine wears off. Ask your dentist about this.  Only take medicine as told by your dentist or doctor.  Avoid chewing food near the painful tooth until after all treatment is done. Ask your dentist about this. GET HELP RIGHT AWAY IF:   The problem gets worse or new problems appear.  You have a fever.  There is redness and puffiness (swelling) of the face, jaw, or neck.  You cannot open your mouth.  There is pain in the jaw.  There is very bad pain that is not helped by medicine. MAKE SURE YOU:   Understand these instructions.  Will watch your condition.  Will get help right away if you are not doing well or get worse. Document Released: 05/07/2008 Document Revised: 02/11/2012 Document Reviewed: 05/07/2008 [facility] Patient Information 2015 [place], [place]. This information is not intended to replace advice given to you by your health care provider. Make sure you discuss any questions you have with your health care provider.    Abscessed Tooth An abscessed tooth is an infection around your tooth. It may be caused by holes or damage to the tooth (cavity) or a dental disease. An abscessed tooth causes mild to very bad pain in and around the tooth. See your dentist right away if you have tooth or gum pain. HOME CARE  Take your medicine as told. Finish it even if you start to feel better.  Do not drive after taking pain medicine.  Rinse your mouth (gargle) often with salt water ( teaspoon salt in 8 ounces of warm water).  Do not apply heat to the outside of your face. GET HELP RIGHT AWAY IF:   You have a temperature by mouth above 102 F (38.9 C), not controlled by medicine.  You  have chills and a very bad headache.  You have problems breathing or swallowing.  Your mouth will not open.  You develop puffiness (swelling) on the neck or around the eye.  Your pain is not helped by medicine.  Your pain is getting worse instead of better. MAKE SURE YOU:   Understand these instructions.  Will watch your condition.  Will get help right away if you are not doing well or get worse. Document Released: 05/07/2008 Document Revised: 02/11/2012 Document Reviewed: 02/27/2011 [facility] Patient Information 2015 [place], [place]. This information is not intended to replace advice given to you by your health care provider. Make sure you discuss any questions you have with your health care provider.

## 2014-07-16 NOTE — ED Provider Notes (Signed)
Medical screening examination/treatment/procedure(s) were performed by non-physician practitioner and as supervising physician I was immediately available for consultation/collaboration.   EKG Interpretation None       Nadalyn Deringer M Jakaleb Payer, MD 07/16/14 0557 

## 2014-07-16 NOTE — ED Notes (Signed)
PA at bedside.

## 2014-07-16 NOTE — ED Provider Notes (Signed)
CSN: [id number]     Arrival date & time 07/16/14  0122 History   First MD Initiated Contact with Patient 07/16/14 0308     Chief Complaint  Patient presents with  . Dental Pain   HPI  History provided by the patient. The patient is a 33 year old female who presents with complaints of right lower molar tooth pain for the past 24 hours. Symptoms have worsened. She reports a history of broken tooth to the area but generally does not give her problems. Denies any associated swelling, bleeding or drainage from the mouth or gums. Denies any associated fever, chills or sweats.     History reviewed. No pertinent past medical history. Past Surgical History  Procedure Laterality Date  . Cesarean section     No family history on file. History  Substance Use Topics  . Smoking status: Current Some Day Smoker -- 0.25 packs/day for 2 years    Types: Cigarettes  . Smokeless tobacco: Never Used  . Alcohol Use: Yes     Comment: sometimes   OB History   Grav Para Term Preterm Abortions TAB SAB Ect Mult Living                 Review of Systems  Constitutional: Negative for fever, chills and diaphoresis.  HENT: Positive for dental problem.   All other systems reviewed and are negative.     Allergies  Review of patient's allergies indicates no known allergies.  Home Medications   Prior to Admission medications   Not on File   BP 168/104  Pulse 67  Temp(Src) 98.3 F (36.8 C) (Oral)  Resp 18  Ht 5\' 6"  (1.676 m)  Wt 167 lb (75.751 kg)  BMI 26.97 kg/m2  SpO2 100%  LMP 06/15/2014 Physical Exam  Nursing note and vitals reviewed. Constitutional: She is oriented to person, place, and time. She appears well-developed and well-nourished. No distress.  HENT:  Head: Normocephalic.  Mouth/Throat:    Dental caries present. There is dental decay of the right lower first molar tooth. There is pain to percussion over this area. Minor swelling to the lateral films. No fluctuance or drainable  abscess. No swelling of the tongue. No signs of Ludwig's angina.  Neck: Normal range of motion. Neck supple.  Cardiovascular: Normal rate and regular rhythm.   Pulmonary/Chest: Effort normal and breath sounds normal. No respiratory distress.  Lymphadenopathy:    She has no cervical adenopathy.  Neurological: She is alert and oriented to person, place, and time.  Skin: Skin is warm and dry. No rash noted.  Psychiatric: She has a normal mood and affect. Her behavior is normal.    ED Course  Procedures   COORDINATION OF CARE:  Nursing notes reviewed. Vital signs reviewed. Initial pt interview and examination performed.   Filed Vitals:   07/16/14 0153 07/16/14 0339  BP: 162/93 168/104  Pulse: 71 67  Temp: 98.4 F (36.9 C) 98.3 F (36.8 C)  TempSrc: Oral Oral  Resp: 20 18  Height: 5\' 6"  (1.676 m)   Weight: 167 lb (75.751 kg)   SpO2: 100% 100%    3:52 AM-patient seen and evaluated. Appears well does not appear in severe pain or discomfort. Afebrile. Does not appear toxic.   Treatment plan initiated: Medications  amoxicillin (AMOXIL) capsule 500 mg (not administered)    Dental Block Performed by: Angus Seller Authorized by: Angus Seller Consent: Verbal consent obtained. Risks and benefits: risks, benefits and alternatives were discussed Consent given by: patient  Patient identity confirmed: provided demographic data  Location: right lower first molar  Local anesthetic: Bupivacaine 0.5% with epinephrine  Anesthetic total: 1.8 ml  Irrigation method: syringe  Patient tolerance: Patient tolerated the procedure well with no immediate complications. Pain improved.      MDM   Final diagnoses:  Pain, dental  Periapical abscess        Angus Sellereter S Shatisha Falter, PA-C 07/16/14 [phone]

## 2014-12-16 ENCOUNTER — Encounter (HOSPITAL_COMMUNITY): Payer: Self-pay | Admitting: Emergency Medicine

## 2014-12-16 ENCOUNTER — Emergency Department (HOSPITAL_COMMUNITY)
Admission: EM | Admit: 2014-12-16 | Discharge: 2014-12-16 | Disposition: A | Discharge status: Home / Self Care | Payer: Medicaid Other | Attending: Emergency Medicine | Admitting: Emergency Medicine

## 2014-12-16 DIAGNOSIS — Z792 Long term (current) use of antibiotics: Secondary | ICD-10-CM | POA: Insufficient documentation

## 2014-12-16 DIAGNOSIS — Y999 Unspecified external cause status: Secondary | ICD-10-CM | POA: Diagnosis not present

## 2014-12-16 DIAGNOSIS — Y9301 Activity, walking, marching and hiking: Secondary | ICD-10-CM | POA: Insufficient documentation

## 2014-12-16 DIAGNOSIS — M79605 Pain in left leg: Secondary | ICD-10-CM | POA: Diagnosis present

## 2014-12-16 DIAGNOSIS — S76012A Strain of muscle, fascia and tendon of left hip, initial encounter: Secondary | ICD-10-CM | POA: Diagnosis not present

## 2014-12-16 DIAGNOSIS — Y929 Unspecified place or not applicable: Secondary | ICD-10-CM | POA: Diagnosis not present

## 2014-12-16 DIAGNOSIS — Z720 Tobacco use: Secondary | ICD-10-CM | POA: Insufficient documentation

## 2014-12-16 DIAGNOSIS — X58XXXA Exposure to other specified factors, initial encounter: Secondary | ICD-10-CM | POA: Insufficient documentation

## 2014-12-16 MED ORDER — METHOCARBAMOL 500 MG PO TABS: 500.0000 mg | ORAL_TABLET | Freq: Two times a day (BID) | ORAL | Status: DC

## 2014-12-16 MED ORDER — IBUPROFEN 800 MG PO TABS: 800.0000 mg | ORAL_TABLET | Freq: Three times a day (TID) | ORAL | Status: DC | PRN

## 2014-12-16 NOTE — ED Provider Notes (Signed)
CSN: [id number]     Arrival date & time 12/16/14  1150 History  This chart was scribed for non-physician practitioner, Fayrene HelperBowie Cid Agena, PA-C, working with Juliet RudeNathan R. Rubin PayorPickering, MD by Charline BillsEssence Howell, ED Scribe. This patient was seen in room TR07C/TR07C and the patient's care was started at 1:07 PM.   Chief Complaint  Patient presents with  . Leg Pain   The history is provided by the patient. No language interpreter was used.   HPI Comments: Kirtland BouchardVaronica Rauh is a 34 y.o. female who presents to the Emergency Department complaining of constant L hip pain onset today. Pt states that she was walking at work when she initially noted the pain. No known injury. Pain is exacerbated with walking. She describes pain as a pulling sensation that radiates down the back of her L thigh. Pt denies numbness/tingling. She has tried Aleve PTA.  She works at a job that requires a lot of walking.    History reviewed. No pertinent past medical history. Past Surgical History  Procedure Laterality Date  . Cesarean section     No family history on file. History  Substance Use Topics  . Smoking status: Current Some Day Smoker -- 0.25 packs/day for 2 years    Types: Cigarettes  . Smokeless tobacco: Never Used  . Alcohol Use: Yes     Comment: sometimes   OB History    No data available     Review of Systems  Musculoskeletal: Positive for arthralgias. Negative for joint swelling.  Neurological: Negative for numbness.   Allergies  Review of patient's allergies indicates no known allergies.  Home Medications   Prior to Admission medications   Medication Sig Start Date End Date Taking? Authorizing Provider  amoxicillin (AMOXIL) 500 MG capsule Take 1 capsule (500 mg total) by mouth 3 (three) times daily. 07/16/14   Angus SellerPeter S Dammen, PA-C   Triage Vitals: BP 161/98 mmHg  Pulse 74  Temp(Src) 98.4 F (36.9 C) (Oral)  Resp 12  Ht 5\' 6"  (1.676 m)  Wt 173 lb (78.472 kg)  BMI 27.94 kg/m2  SpO2 100%  LMP  11/16/2014 Physical Exam  Constitutional: She is oriented to person, place, and time. She appears well-developed and well-nourished. No distress.  HENT:  Head: Normocephalic and atraumatic.  Eyes: Conjunctivae and EOM are normal.  Neck: Neck supple.  Cardiovascular: Normal rate.   Pulmonary/Chest: Effort normal.  Musculoskeletal: Normal range of motion.  L Hip: tenderness along lateral aspeect of hip, without overlying skin changes.  Full ROM. Decreased pain with flexion. Full ROM in L knee. 5/5 muscle strength bilaterally. No swelling. No obvious deformity.  Neurological: She is alert and oriented to person, place, and time.  Skin: Skin is warm and dry.  Psychiatric: She has a normal mood and affect. Her behavior is normal.  Nursing note and vitals reviewed.  ED Course  Procedures (including critical care time) DIAGNOSTIC STUDIES: Oxygen Saturation is 100% on RA, normal by my interpretation.    COORDINATION OF CARE: 1:10 PM-Discussed treatment plan with pt at bedside and pt agreed to plan. Suspect strain. No midline spine tenderness, no red flags, no radicular pain.    Labs Review Labs Reviewed - No data to display  Imaging Review No results found.   EKG Interpretation None      MDM   Final diagnoses:  Hip strain, left, initial encounter    BP 161/98 mmHg  Pulse 74  Temp(Src) 98.4 F (36.9 C) (Oral)  Resp 12  Ht 5'  6" (1.676 m)  Wt 173 lb (78.472 kg)  BMI 27.94 kg/m2  SpO2 100%  LMP 11/16/2014   I personally performed the services described in this documentation, which was scribed in my presence. The recorded information has been reviewed and is accurate.    Fayrene Helper, PA-C 12/16/14 1313  Nathan R. Rubin Payor, MD 12/17/14 [phone]

## 2014-12-16 NOTE — ED Notes (Signed)
C/o sudden left anterior thigh pain today while at work. No known injury.

## 2014-12-16 NOTE — Discharge Instructions (Signed)
Hip Pain Your hip is the joint between your upper legs and your lower pelvis. The bones, cartilage, tendons, and muscles of your hip joint perform a lot of work each day supporting your body weight and allowing you to move around. Hip pain can range from a minor ache to severe pain in one or both of your hips. Pain may be felt on the inside of the hip joint near the groin, or the outside near the buttocks and upper thigh. You may have swelling or stiffness as well.  HOME CARE INSTRUCTIONS   Take medicines only as directed by your health care provider.  Apply ice to the injured area:  Put ice in a plastic bag.  Place a towel between your skin and the bag.  Leave the ice on for 15-20 minutes at a time, 3-4 times a day.  Keep your leg raised (elevated) when possible to lessen swelling.  Avoid activities that cause pain.  Follow specific exercises as directed by your health care provider.  Sleep with a pillow between your legs on your most comfortable side.  Record how often you have hip pain, the location of the pain, and what it feels like. SEEK MEDICAL CARE IF:   You are unable to put weight on your leg.  Your hip is red or swollen or very tender to touch.  Your pain or swelling continues or worsens after 1 week.  You have increasing difficulty walking.  You have a fever. SEEK IMMEDIATE MEDICAL CARE IF:   You have fallen.  You have a sudden increase in pain and swelling in your hip. MAKE SURE YOU:   Understand these instructions.  Will watch your condition.  Will get help right away if you are not doing well or get worse. Document Released: 05/09/2010 Document Revised: 04/05/2014 Document Reviewed: 07/16/2013 [employer] Patient Information 2015 [employer]. This information is not intended to replace advice given to you by your health care provider. Make sure you discuss any questions you have with your health care provider.  

## 2015-05-23 ENCOUNTER — Emergency Department (HOSPITAL_COMMUNITY): Payer: No Typology Code available for payment source

## 2015-05-23 ENCOUNTER — Emergency Department (HOSPITAL_COMMUNITY)
Admission: EM | Admit: 2015-05-23 | Discharge: 2015-05-23 | Disposition: A | Discharge status: Home / Self Care | Payer: No Typology Code available for payment source | Attending: Emergency Medicine | Admitting: Emergency Medicine

## 2015-05-23 ENCOUNTER — Encounter (HOSPITAL_COMMUNITY): Payer: Self-pay

## 2015-05-23 DIAGNOSIS — Y9389 Activity, other specified: Secondary | ICD-10-CM | POA: Insufficient documentation

## 2015-05-23 DIAGNOSIS — Y998 Other external cause status: Secondary | ICD-10-CM | POA: Diagnosis not present

## 2015-05-23 DIAGNOSIS — S39012A Strain of muscle, fascia and tendon of lower back, initial encounter: Secondary | ICD-10-CM | POA: Insufficient documentation

## 2015-05-23 DIAGNOSIS — S20219A Contusion of unspecified front wall of thorax, initial encounter: Secondary | ICD-10-CM

## 2015-05-23 DIAGNOSIS — Z720 Tobacco use: Secondary | ICD-10-CM | POA: Diagnosis not present

## 2015-05-23 DIAGNOSIS — Y92410 Unspecified street and highway as the place of occurrence of the external cause: Secondary | ICD-10-CM | POA: Diagnosis not present

## 2015-05-23 DIAGNOSIS — S29001A Unspecified injury of muscle and tendon of front wall of thorax, initial encounter: Secondary | ICD-10-CM | POA: Diagnosis present

## 2015-05-23 DIAGNOSIS — S4991XA Unspecified injury of right shoulder and upper arm, initial encounter: Secondary | ICD-10-CM | POA: Diagnosis not present

## 2015-05-23 MED ORDER — IBUPROFEN 600 MG PO TABS: 600.0000 mg | ORAL_TABLET | Freq: Four times a day (QID) | ORAL | Status: DC | PRN

## 2015-05-23 MED ORDER — TRAMADOL HCL 50 MG PO TABS: 50.0000 mg | ORAL_TABLET | Freq: Four times a day (QID) | ORAL | Status: DC | PRN

## 2015-05-23 MED ORDER — HYDROCODONE-ACETAMINOPHEN 5-325 MG PO TABS
1.0000 | ORAL_TABLET | Freq: Once | ORAL | Status: AC
  Administered 2015-05-23: 1 via ORAL
  Filled 2015-05-23: qty 1

## 2015-05-23 MED ORDER — CYCLOBENZAPRINE HCL 10 MG PO TABS: 10.0000 mg | ORAL_TABLET | Freq: Two times a day (BID) | ORAL | Status: DC | PRN
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
## 2016-03-16 ENCOUNTER — Encounter (HOSPITAL_COMMUNITY): Payer: Self-pay | Admitting: Emergency Medicine

## 2016-03-16 ENCOUNTER — Emergency Department (HOSPITAL_COMMUNITY)
Admission: EM | Admit: 2016-03-16 | Discharge: 2016-03-16 | Disposition: A | Discharge status: Home / Self Care | Payer: Medicaid Other | Attending: Emergency Medicine | Admitting: Emergency Medicine

## 2016-03-16 DIAGNOSIS — K029 Dental caries, unspecified: Secondary | ICD-10-CM | POA: Diagnosis not present

## 2016-03-16 DIAGNOSIS — K0889 Other specified disorders of teeth and supporting structures: Secondary | ICD-10-CM | POA: Diagnosis present

## 2016-03-16 DIAGNOSIS — F17210 Nicotine dependence, cigarettes, uncomplicated: Secondary | ICD-10-CM | POA: Insufficient documentation

## 2016-03-16 DIAGNOSIS — K002 Abnormalities of size and form of teeth: Secondary | ICD-10-CM | POA: Insufficient documentation

## 2016-03-16 MED ORDER — PENICILLIN V POTASSIUM 500 MG PO TABS: 500.0000 mg | ORAL_TABLET | Freq: Two times a day (BID) | ORAL | Status: AC

## 2016-03-16 MED ORDER — IBUPROFEN 800 MG PO TABS: 800.0000 mg | ORAL_TABLET | Freq: Three times a day (TID) | ORAL | Status: DC

## 2016-03-16 NOTE — ED Provider Notes (Signed)
CSN: [id number]     Arrival date & time 03/16/16  1110 History  By signing my name below, I, Tanda Rockers, attest that this documentation has been prepared under the direction and in the presence of Melburn Hake, PA-C. Electronically Signed: Tanda Rockers, ED Scribe. 03/16/2016. 1:03 PM.   Chief Complaint  Patient presents with  . Dental Pain   The history is provided by the patient. No language interpreter was used.     HPI Comments: Kayla Adkins is a 35 y.o. female who presents to the Emergency Department complaining of gradual onset, intermittent, right lower dental pain x a couple of months, but notes the pain has been constant for the past month. Pt also complains of mild right facial swelling. She has been taking Tylenol #3 without relief. Pt has also been applying a warm compress to the area with some relief. Pt does have a dentist but does not believe he is doing a good job and would like a referral to another dentist. Denies fever, neck swelling, drainage from tooth, sour taste in mouth, difficulty swallowing, drooling, shortness of breath, or any other associated symptoms.    History reviewed. No pertinent past medical history. Past Surgical History  Procedure Laterality Date  . Cesarean section     No family history on file. Social History  Substance Use Topics  . Smoking status: Current Some Day Smoker -- 0.25 packs/day for 2 years    Types: Cigarettes  . Smokeless tobacco: Never Used  . Alcohol Use: Yes     Comment: sometimes   OB History    No data available     Review of Systems  Constitutional: Negative for fever.  HENT: Positive for dental problem and facial swelling. Negative for drooling and trouble swallowing.   Respiratory: Negative for shortness of breath.   Musculoskeletal:       Negative for neck swelling   Allergies  Review of patient's allergies indicates no known allergies.  Home Medications   Prior to Admission medications   Medication  Sig Start Date End Date Taking? Authorizing Provider  acetaminophen-codeine (TYLENOL #3) 300-30 MG tablet Take 1 tablet by mouth every 4 (four) hours as needed for moderate pain.   Yes Historical Provider, MD  amoxicillin (AMOXIL) 500 MG capsule Take 1 capsule (500 mg total) by mouth 3 (three) times daily. Patient not taking: Reported on 03/16/2016 07/16/14   Ivonne Andrew, PA-C  cyclobenzaprine (FLEXERIL) 10 MG tablet Take 1 tablet (10 mg total) by mouth 2 (two) times daily as needed for muscle spasms. Patient not taking: Reported on 03/16/2016 05/23/15   Tatyana Kirichenko, PA-C  ibuprofen (ADVIL,MOTRIN) 800 MG tablet Take 1 tablet (800 mg total) by mouth 3 (three) times daily. 03/16/16   Barrett Henle, PA-C  methocarbamol (ROBAXIN) 500 MG tablet Take 1 tablet (500 mg total) by mouth 2 (two) times daily. Patient not taking: Reported on 03/16/2016 12/16/14   Fayrene Helper, PA-C  penicillin v potassium (VEETID) 500 MG tablet Take 1 tablet (500 mg total) by mouth 2 (two) times daily. 03/16/16 03/23/16  Barrett Henle, PA-C  traMADol (ULTRAM) 50 MG tablet Take 1 tablet (50 mg total) by mouth every 6 (six) hours as needed. Patient not taking: Reported on 03/16/2016 05/23/15   Tatyana Kirichenko, PA-C   BP 154/99 mmHg  Pulse 67  Temp(Src) 98.2 F (36.8 C) (Oral)  Resp 16  SpO2 100%   Physical Exam  Constitutional: She is oriented to person, place, and time. She  appears well-developed and well-nourished.  HENT:  Head: Normocephalic and atraumatic.  Mouth/Throat: Oropharynx is clear and moist and mucous membranes are normal. No trismus in the jaw. Abnormal dentition. Dental caries present. No dental abscesses or uvula swelling. No oropharyngeal exudate, posterior oropharyngeal edema, posterior oropharyngeal erythema or tonsillar abscesses.    No facial swelling  Eyes: Conjunctivae and EOM are normal. Right eye exhibits no discharge. Left eye exhibits no discharge. No scleral icterus.   Neck: Normal range of motion. Neck supple.  Cardiovascular: Normal rate.   Pulmonary/Chest: Effort normal. No stridor.  Lymphadenopathy:    She has no cervical adenopathy.  Neurological: She is alert and oriented to person, place, and time.  Nursing note and vitals reviewed.   ED Course  Procedures (including critical care time)  DIAGNOSTIC STUDIES: Oxygen Saturation is 99% on RA, normal by my interpretation.    COORDINATION OF CARE: 12:59 PM-Discussed treatment plan which includes Rx antibiotics and Ibuprofen with pt at bedside and pt agreed to plan. Offered pt dental block but she declined at this time.   Labs Review Labs Reviewed - No data to display  Imaging Review No results found.   EKG Interpretation None      MDM   Final diagnoses:  Pain, dental   Patient with dentalgia.  No abscess requiring immediate incision and drainage.  Exam not concerning for Ludwig's angina or pharyngeal abscess.  Will treat with Rx penicillin and Ibuprofen. Pt given referral to [facility] in [place].  Discussed return precautions. Pt safe for discharge.  I personally performed the services described in this documentation, which was scribed in my presence. The recorded information has been reviewed and is accurate.      Satira Sarkicole Elizabeth KlukwanNadeau, PA-C 03/16/16 1328  Samuel JesterKathleen McManus, DO 03/18/16 1427

## 2016-03-16 NOTE — ED Notes (Signed)
Patient here with complaints of right sided dental pain x1921month. Reports that she has a dentist, but needs a referral to another one. Pain 10/10.

## 2016-03-16 NOTE — Discharge Instructions (Signed)
Take your medications as prescribed. I recommend eating prior to taking ibuprofen and prevent gastrointestinal side effects. I also recommend applying ice to affected area for 15-20 minutes 3-4 times daily to help with pain and swelling. Follow-up with one of the dental clinics listed below for further management of your dental pain. Return to the emergency department if symptoms worsen or new onset of fever, facial/neck swelling, unable to swallow due to pain/swelling, drooling, difficulty breathing, unable to tolerate fluids due to pain with swallowing, drainage.  [facility] Community Service Learning [facility] [address] [place], [place] [address] Phone [phone]  [facility] [place] exemplifies the [employer] vision to improve the health and quality of life of all [place]ians by Public house manager with a passion to care for the underserved and by leading the nation in community-based, service learning oral health education.  We are committed to offering comprehensive general dental services for adults, children and special needs patients in a safe, caring and professional setting.   Appointments: Our clinic is open Monday through Friday 8:00 a.m. until 5:00 p.m. The amount of time scheduled for an appointment depends on the patients specific needs. We ask that you keep your appointed time for care or provide 24-hour notice of all appointment changes. Parents or legal guardians must accompany minor children.   Payment for Services: Medicaid and other insurance plans are welcome. Payment for services is due when services are rendered and may be made by cash or credit card. If you have dental insurance, we will assist you with your claim submission.    Emergencies:  Emergency services will be provided Monday through Friday on a  walk-in basis.  Please arrive early for emergency services. After hours emergency services will be provided for patients of record as required.   Services:  Armed forces operational officer Dentistry Oral Surgery - Extractions Root Canals Sealants and Tooth Colored Fillings Crowns and Bridges Dentures and Partial Dentures Implant Services Periodontal Services and Cleanings Cosmetic Armed forces operational officer 3-D/[employer] [facility]   [employer] [facility] [employer] is a great source of information about community services available.  Access by dialing 2-1-1 from anywhere in [place], or by website -  PooledIncome.pl.   Other Local Resources (Updated 12/2015)  Dental  Care   Services    Phone Number and Address  Cost  [facility] For children 13 - 69 years of age:   Cleaning  Tooth brushing/flossing instruction  Sealants, fillings, crowns  Extractions  Emergency treatment  [phone] [address] [place], [place] [address] Charges based on family income.  Medicaid and some insurance plans accepted.     [facility] - [facility], fillings, crowns  Extractions  Emergency treatment [phone] [address] [place], [place]  Pregnant women 61 years of age or older with a Medicaid card  [facility] - [place]  Cleaning  Sealants, fillings, crowns  Extractions  Emergency treatment [phone] [address] [place], [place] Pregnant women 61 years of age or older with a Medicaid card  [facility] Department of Health - [employer] For children 57 - 74 years of age:   Cleaning  Tooth brushing/flossing instruction  Sealants, fillings, crowns  Extractions  Emergency treatment Limited orthodontic services for patients with  Medicaid [phone] [address] [place], [place] [address] Medicaid and [facility] Health Choice cover for children up to age 36 and pregnant women.  Parents of children up to age 36 without Medicaid pay a reduced fee at time of service.  [facility] [place] For children 90 - 36 years of age:   Cleaning  Tooth brushing/flossing instruction  Sealants, fillings, crowns  Extractions  Emergency treatment Limited orthodontic services for patients with Medicaid [phone] [address] [place], [place].  Medicaid and [place] Health Choice cover for children up to age 36 and pregnant women.  Parents of children up to age 36 without Medicaid pay a reduced fee.  [facility]  Cleaning  Sealants, fillings, crowns  Extractions  Hours: Tuesdays and Thursdays, 4:15 - 8 pm [phone] [address], [address] [place], [place] [address] Services free of charge to [facility] residents ages 18-64 who do not have health insurance, Medicare, [place], or [place] benefits and fall within federal poverty guidelines  [facility]    Provides dental care in addition to primary medical care, nutritional counseling, and pharmacy:  Nurse, mental healthCleaning  Sealants, fillings, crowns  Extractions                  [phone] [facility], [address] [place], [place]  [phone] [facility], [address] [place], [place]  [phone] [facility] [place], [place]  [phone] [facility], [address] [place], [place]  [phone] [facility] [address] [place], [place] Accepts [place], [place], most insurance.  Also provides services available to all with fees adjusted based on ability to pay.    [facility]  Cleaning  Tooth brushing/flossing instruction  Sealants,  fillings, crowns  Extractions  Emergency treatment Hours: Tuesdays, Thursdays, and Fridays from 8 am to 5 pm by appointment only. [phone] [address] [place], [place] [address] [facility] residents with Medicaid (depending on eligibility) and children with [facility] Health Choice - call for more information.  [facility]  Extractions only  Hours: 2nd and 4th Thursday of each month from 6:30 am - 9 am.   [phone] [address] [place], [place] [address] Ages 1418 and older only.  Patients are seen on a first come, first served basis.  [employer]  [employer]  Fillings  Extractions  Orthodontics  Endodontics  Implants/Crowns/Bridges  Complete and partial dentures [phone] [place], [place] Patients must complete an application for services.  There is often a waiting list.

## 2016-09-06 ENCOUNTER — Emergency Department (HOSPITAL_COMMUNITY)
Admission: EM | Admit: 2016-09-06 | Discharge: 2016-09-06 | Disposition: A | Discharge status: Home / Self Care | Payer: Medicaid Other | Attending: Emergency Medicine | Admitting: Emergency Medicine

## 2016-09-06 ENCOUNTER — Encounter (HOSPITAL_COMMUNITY): Payer: Self-pay | Admitting: *Deleted

## 2016-09-06 DIAGNOSIS — K047 Periapical abscess without sinus: Secondary | ICD-10-CM | POA: Insufficient documentation

## 2016-09-06 DIAGNOSIS — F17210 Nicotine dependence, cigarettes, uncomplicated: Secondary | ICD-10-CM | POA: Diagnosis not present

## 2016-09-06 DIAGNOSIS — K0889 Other specified disorders of teeth and supporting structures: Secondary | ICD-10-CM | POA: Diagnosis present

## 2016-09-06 MED ORDER — HYDROCODONE-ACETAMINOPHEN 5-325 MG PO TABS
1.0000 | ORAL_TABLET | Freq: Once | ORAL | Status: AC
  Administered 2016-09-06: 1 via ORAL
  Filled 2016-09-06: qty 1

## 2016-09-06 MED ORDER — OXYCODONE-ACETAMINOPHEN 5-325 MG PO TABS
1.0000 | ORAL_TABLET | Freq: Once | ORAL | Status: AC
  Administered 2016-09-06: 1 via ORAL

## 2016-09-06 MED ORDER — OXYCODONE-ACETAMINOPHEN 5-325 MG PO TABS
ORAL_TABLET | ORAL | Status: AC
  Filled 2016-09-06: qty 1

## 2016-09-06 MED ORDER — CHLORHEXIDINE GLUCONATE 0.12 % MT SOLN: 15.0000 mL | Freq: Two times a day (BID) | OROMUCOSAL | 0 refills | Status: AC

## 2016-09-06 MED ORDER — HYDROCODONE-ACETAMINOPHEN 5-325 MG PO TABS: 1.0000 | ORAL_TABLET | Freq: Four times a day (QID) | ORAL | 0 refills | Status: DC | PRN

## 2016-09-06 MED ORDER — PENICILLIN V POTASSIUM 500 MG PO TABS: 500.0000 mg | ORAL_TABLET | Freq: Three times a day (TID) | ORAL | 0 refills | Status: AC

## 2016-09-06 MED ORDER — PENICILLIN V POTASSIUM 250 MG PO TABS
500.0000 mg | ORAL_TABLET | Freq: Once | ORAL | Status: AC
  Administered 2016-09-06: 500 mg via ORAL
  Filled 2016-09-06: qty 2

## 2016-09-06 NOTE — ED Provider Notes (Signed)
[facility]PT Provider Note   CSN: [id number] Arrival date & time: 09/06/16  0222     History   Chief Complaint Chief Complaint  Patient presents with  . Dental Pain    HPI Kayla Adkins is a 35 y.o. female.  HPI Patient presents with concern of pain in the right lower face. Patient's pain began 2 days ago, though she has had similar pain episodically over the past year, since she recognized a broken tooth in the area. Over the past 2 days, minimal relief with OTC medication including ibuprofen, Tylenol. No fever, chills, difficulty breathing, speaking, swallowing. However, the patient has increasing pain in the area. No other new changes from baseline, patient states that she is generally well.   History reviewed. No pertinent past medical history.  There are no active problems to display for this patient.   Past Surgical History:  Procedure Laterality Date  . CESAREAN SECTION      OB History    No data available       Home Medications    Prior to Admission medications   Medication Sig Start Date End Date Taking? Authorizing Provider  chlorhexidine (PERIDEX) 0.12 % solution Use as directed 15 mLs in the mouth or throat 2 (two) times daily. 09/06/16 09/11/16  Gerhard Munch, MD  HYDROcodone-acetaminophen (NORCO/VICODIN) 5-325 MG tablet Take 1 tablet by mouth every 6 (six) hours as needed for severe pain. 09/06/16   Gerhard Munch, MD  ibuprofen (ADVIL,MOTRIN) 800 MG tablet Take 1 tablet (800 mg total) by mouth 3 (three) times daily. 03/16/16   Barrett Henle, PA-C  penicillin v potassium (VEETID) 500 MG tablet Take 1 tablet (500 mg total) by mouth 3 (three) times daily. 09/06/16 09/13/16  Gerhard Munch, MD    Family History History reviewed. No pertinent family history.  Social History Social History  Substance Use Topics  . Smoking status: Current Some Day Smoker    Packs/day: 0.25    Years: 2.00    Types: Cigarettes  . Smokeless  tobacco: Never Used  . Alcohol use Yes     Comment: sometimes     Allergies   Review of patient's allergies indicates no known allergies.   Review of Systems Review of Systems  Constitutional: Negative for fever.  Respiratory: Negative for shortness of breath.   Cardiovascular: Negative for chest pain.  Musculoskeletal:       Negative aside from HPI  Skin:       Negative aside from HPI  Allergic/Immunologic: Negative for immunocompromised state.  Neurological: Negative for weakness.     Physical Exam Updated Vital Signs BP (!) 176/110 (BP Location: Right Arm)   Pulse 76   Temp 98.8 F (37.1 C) (Oral)   Resp 18   SpO2 100%   Physical Exam  Constitutional: She is oriented to person, place, and time. She appears well-developed and well-nourished. No distress.  HENT:  Head: Normocephalic and atraumatic.  Mouth/Throat:    Eyes: Conjunctivae and EOM are normal.  Cardiovascular: Normal rate and regular rhythm.   Pulmonary/Chest: Effort normal and breath sounds normal. No stridor. No respiratory distress.  Abdominal: She exhibits no distension.  Musculoskeletal: She exhibits no edema.  Neurological: She is alert and oriented to person, place, and time. No cranial nerve deficit.  Skin: Skin is warm and dry.  Psychiatric: She has a normal mood and affect.  Nursing note and vitals reviewed.    ED Treatments / Results   Procedures Procedures (including critical care time)  Medications Ordered in ED Medications  HYDROcodone-acetaminophen (NORCO/VICODIN) 5-325 MG per tablet 1 tablet (not administered)  penicillin v potassium (VEETID) tablet 500 mg (not administered)  oxyCODONE-acetaminophen (PERCOCET/ROXICET) 5-325 MG per tablet 1 tablet (1 tablet Oral Given 09/06/16 0239)     Initial Impression / Assessment and Plan / ED Course  I have reviewed the triage vital signs and the nursing notes.  Pertinent labs & imaging results that were available during my care of  the patient were reviewed by me and considered in my medical decision making (see chart for details).  Clinical Course    Patient presents with pain in the right lower jaw, some concern for early infection. Patient provided multiple resources for dental follow-up, started on anti-biotics, analgesia, rinse. No evidence for deep space infection, impending respiratory compromise.  Final Clinical Impressions(s) / ED Diagnoses   Final diagnoses:  Dental infection    New Prescriptions New Prescriptions   CHLORHEXIDINE (PERIDEX) 0.12 % SOLUTION    Use as directed 15 mLs in the mouth or throat 2 (two) times daily.   HYDROCODONE-ACETAMINOPHEN (NORCO/VICODIN) 5-325 MG TABLET    Take 1 tablet by mouth every 6 (six) hours as needed for severe pain.   PENICILLIN V POTASSIUM (VEETID) 500 MG TABLET    Take 1 tablet (500 mg total) by mouth 3 (three) times daily.     Gerhard Munchobert Karma Hiney, MD 09/06/16 [phone]

## 2016-09-06 NOTE — ED Triage Notes (Signed)
Pt c/o right lower dental pain starting yesterday. Pt taking OTC medications without relief.

## 2016-09-06 NOTE — Discharge Instructions (Signed)
As discussed, with your dental issue, it is very important that you take all medication as directed and be sure to follow up with a dentist.  Call today - to either our dentist on call, or to one of the providers listed in the provided resource list.  Return here for concerning changes in your condition.

## 2016-11-04 ENCOUNTER — Encounter (HOSPITAL_COMMUNITY): Payer: Self-pay | Admitting: Emergency Medicine

## 2016-11-04 ENCOUNTER — Emergency Department (HOSPITAL_COMMUNITY)
Admission: EM | Admit: 2016-11-04 | Discharge: 2016-11-04 | Disposition: A | Discharge status: Home / Self Care | Payer: Medicaid Other | Attending: Emergency Medicine | Admitting: Emergency Medicine

## 2016-11-04 DIAGNOSIS — R42 Dizziness and giddiness: Secondary | ICD-10-CM | POA: Diagnosis present

## 2016-11-04 DIAGNOSIS — Z79899 Other long term (current) drug therapy: Secondary | ICD-10-CM | POA: Diagnosis not present

## 2016-11-04 DIAGNOSIS — H8302 Labyrinthitis, left ear: Secondary | ICD-10-CM | POA: Diagnosis not present

## 2016-11-04 DIAGNOSIS — F17210 Nicotine dependence, cigarettes, uncomplicated: Secondary | ICD-10-CM | POA: Diagnosis not present

## 2016-11-04 LAB — CBC
HEMATOCRIT: 37.6 % (ref 36.0–46.0)
HEMOGLOBIN: 12.8 g/dL (ref 12.0–15.0)
MCH: 31.8 pg (ref 26.0–34.0)
MCHC: 34 g/dL (ref 30.0–36.0)
MCV: 93.5 fL (ref 78.0–100.0)
Platelets: 235 10*3/uL (ref 150–400)
RBC: 4.02 MIL/uL (ref 3.87–5.11)
RDW: 13.4 % (ref 11.5–15.5)
WBC: 4.6 10*3/uL (ref 4.0–10.5)

## 2016-11-04 LAB — BASIC METABOLIC PANEL
ANION GAP: 6 (ref 5–15)
BUN: 10 mg/dL (ref 6–20)
CO2: 26 mmol/L (ref 22–32)
Calcium: 8.7 mg/dL — ABNORMAL LOW (ref 8.9–10.3)
Chloride: 104 mmol/L (ref 101–111)
Creatinine, Ser: 0.74 mg/dL (ref 0.44–1.00)
Glucose, Bld: 108 mg/dL — ABNORMAL HIGH (ref 65–99)
POTASSIUM: 3.4 mmol/L — AB (ref 3.5–5.1)
SODIUM: 136 mmol/L (ref 135–145)

## 2016-11-04 LAB — URINALYSIS, ROUTINE W REFLEX MICROSCOPIC
Bilirubin Urine: NEGATIVE
Glucose, UA: NEGATIVE mg/dL
Hgb urine dipstick: NEGATIVE
KETONES UR: NEGATIVE mg/dL
NITRITE: NEGATIVE
PH: 7 (ref 5.0–8.0)
Protein, ur: NEGATIVE mg/dL
SPECIFIC GRAVITY, URINE: 1.023 (ref 1.005–1.030)

## 2016-11-04 LAB — URINE MICROSCOPIC-ADD ON

## 2016-11-04 LAB — I-STAT BETA HCG BLOOD, ED (MC, WL, AP ONLY)

## 2016-11-04 LAB — CBG MONITORING, ED: GLUCOSE-CAPILLARY: 99 mg/dL (ref 65–99)

## 2016-11-04 MED ORDER — MECLIZINE HCL 25 MG PO TABS: 25.0000 mg | ORAL_TABLET | Freq: Three times a day (TID) | ORAL | 0 refills | Status: DC | PRN

## 2016-11-04 MED ORDER — MECLIZINE HCL 25 MG PO TABS
25.0000 mg | ORAL_TABLET | Freq: Once | ORAL | Status: AC
  Administered 2016-11-04: 25 mg via ORAL
  Filled 2016-11-04: qty 1

## 2016-11-04 MED ORDER — DEXAMETHASONE 4 MG PO TABS
10.0000 mg | ORAL_TABLET | Freq: Once | ORAL | Status: AC
  Administered 2016-11-04: 10 mg via ORAL
  Filled 2016-11-04: qty 2

## 2016-11-04 NOTE — ED Provider Notes (Signed)
[facility]-EMERGENCY DEPT Provider Note   CSN: [id number] Arrival date & time: 11/04/16  1223     History   Chief Complaint Chief Complaint  Patient presents with  . Dizziness  . Nausea  . Fatigue    HPI Kayla Adkins is a 35 y.o. female.  35 yo F with a chief complaints of vertigo. This been going on for the past few days. Associated with cough and congestion. Denies fevers or chills denies ear pain denies tinnitus. Denies history of this previously. Denies head injury denies loss consciousness.   The history is provided by the patient.  Dizziness  Quality:  Head spinning Severity:  Moderate Onset quality:  Sudden Duration:  2 days Timing:  Constant Progression:  Worsening Chronicity:  New Relieved by:  Nothing Worsened by:  Nothing Ineffective treatments:  None tried Associated symptoms: no chest pain, no headaches, no nausea, no palpitations, no shortness of breath and no vomiting     History reviewed. No pertinent past medical history.  There are no active problems to display for this patient.   Past Surgical History:  Procedure Laterality Date  . CESAREAN SECTION      OB History    No data available       Home Medications    Prior to Admission medications   Medication Sig Start Date End Date Taking? Authorizing Provider  HYDROcodone-acetaminophen (NORCO/VICODIN) 5-325 MG tablet Take 1 tablet by mouth every 6 (six) hours as needed for severe pain. 09/06/16   Gerhard Munchobert Lockwood, MD  ibuprofen (ADVIL,MOTRIN) 800 MG tablet Take 1 tablet (800 mg total) by mouth 3 (three) times daily. 03/16/16   Barrett HenleNicole Elizabeth Nadeau, PA-C  meclizine (ANTIVERT) 25 MG tablet Take 1 tablet (25 mg total) by mouth 3 (three) times daily as needed for dizziness. 11/04/16   Melene Planan Duel Conrad, DO    Family History No family history on file.  Social History Social History  Substance Use Topics  . Smoking status: Current Some Day Smoker    Packs/day: 0.25    Years: 2.00    Types:  Cigarettes  . Smokeless tobacco: Never Used  . Alcohol use Yes     Comment: sometimes     Allergies   Patient has no known allergies.   Review of Systems Review of Systems  Constitutional: Negative for chills and fever.  HENT: Negative for congestion and rhinorrhea.   Eyes: Negative for redness and visual disturbance.  Respiratory: Negative for shortness of breath and wheezing.   Cardiovascular: Negative for chest pain and palpitations.  Gastrointestinal: Negative for nausea and vomiting.  Genitourinary: Negative for dysuria and urgency.  Musculoskeletal: Negative for arthralgias and myalgias.  Skin: Negative for pallor and wound.  Neurological: Positive for dizziness. Negative for headaches.     Physical Exam Updated Vital Signs BP 149/93 (BP Location: Left Arm)   Pulse 77   Temp 98.2 F (36.8 C) (Oral)   Resp 18   Ht 5\' 3"  (1.6 m)   Wt 156 lb (70.8 kg)   LMP 10/30/2016   SpO2 99%   BMI 27.63 kg/m   Physical Exam  Constitutional: She is oriented to person, place, and time. She appears well-developed and well-nourished. No distress.  HENT:  Head: Normocephalic and atraumatic.  Swollen turbinates, posterior nasal drip, no noted sinus ttp, tm normal bilaterally.     Eyes: EOM are normal. Pupils are equal, round, and reactive to light.  Neck: Normal range of motion. Neck supple.  Cardiovascular: Normal rate and regular  rhythm.  Exam reveals no gallop and no friction rub.   No murmur heard. Pulmonary/Chest: Effort normal. She has no wheezes. She has no rales.  Abdominal: Soft. She exhibits no distension and no mass. There is no tenderness. There is no guarding.  Musculoskeletal: She exhibits no edema or tenderness.  Neurological: She is alert and oriented to person, place, and time. She has normal strength. No cranial nerve deficit or sensory deficit. She displays a negative Romberg sign. Coordination and gait normal. GCS eye subscore is 4. GCS verbal subscore is 5.  GCS motor subscore is 6. She displays no Babinski's sign on the right side. She displays no Babinski's sign on the left side.  Reflex Scores:      Tricep reflexes are 2+ on the right side and 2+ on the left side.      Bicep reflexes are 2+ on the right side and 2+ on the left side.      Brachioradialis reflexes are 2+ on the right side and 2+ on the left side.      Patellar reflexes are 2+ on the right side and 2+ on the left side.      Achilles reflexes are 2+ on the right side and 2+ on the left side. Left fast going nystagmus  Skin: Skin is warm and dry. She is not diaphoretic.  Psychiatric: She has a normal mood and affect. Her behavior is normal.  Nursing note and vitals reviewed.    ED Treatments / Results  Labs (all labs ordered are listed, but only abnormal results are displayed) Labs Reviewed  BASIC METABOLIC PANEL - Abnormal; Notable for the following:       Result Value   Potassium 3.4 (*)    Glucose, Bld 108 (*)    Calcium 8.7 (*)    All other components within normal limits  URINALYSIS, ROUTINE W REFLEX MICROSCOPIC (NOT AT [facility]) - Abnormal; Notable for the following:    APPearance CLOUDY (*)    Leukocytes, UA SMALL (*)    All other components within normal limits  URINE MICROSCOPIC-ADD ON - Abnormal; Notable for the following:    Squamous Epithelial / LPF 0-5 (*)    Bacteria, UA FEW (*)    All other components within normal limits  CBC  CBG MONITORING, ED  I-STAT BETA HCG BLOOD, ED ([facility], [facility], [facility] ONLY)    EKG  EKG Interpretation None       Radiology No results found.  Procedures Procedures (including critical care time)  Medications Ordered in ED Medications  dexamethasone (DECADRON) tablet 10 mg (10 mg Oral Given 11/04/16 1502)  meclizine (ANTIVERT) tablet 25 mg (25 mg Oral Given 11/04/16 1503)     Initial Impression / Assessment and Plan / ED Course  I have reviewed the triage vital signs and the nursing notes.  Pertinent labs & imaging results  that were available during my care of the patient were reviewed by me and considered in my medical decision making (see chart for details).  Clinical Course     35 yo F With a chief complaint of vertigo. Appears to be peripheral. Suspect labyrinthitis with URI like symptoms. Patient was given a dose of Decadron. Meclizine prescription. Discharge home.  5:21 PM:  I have discussed the diagnosis/risks/treatment options with the patient and family and believe the pt to be eligible for discharge home to follow-up with PCP. We also discussed returning to the ED immediately if new or worsening sx occur. We discussed the  sx which are most concerning (e.g., sudden worsening pain, fever, inability to tolerate by mouth) that necessitate immediate return. Medications administered to the patient during their visit and any new prescriptions provided to the patient are listed below.  Medications given during this visit Medications  dexamethasone (DECADRON) tablet 10 mg (10 mg Oral Given 11/04/16 1502)  meclizine (ANTIVERT) tablet 25 mg (25 mg Oral Given 11/04/16 1503)     The patient appears reasonably screen and/or stabilized for discharge and I doubt any other medical condition or other [facility] requiring further screening, evaluation, or treatment in the ED at this time prior to discharge.    Final Clinical Impressions(s) / ED Diagnoses   Final diagnoses:  Vertigo  Labyrinthitis of left ear    New Prescriptions Discharge Medication List as of 11/04/2016  2:32 PM    START taking these medications   Details  meclizine (ANTIVERT) 25 MG tablet Take 1 tablet (25 mg total) by mouth 3 (three) times daily as needed for dizziness., Starting Sun 11/04/2016, Print         Melene Planan Mace Weinberg, DO 11/04/16 1722

## 2016-11-04 NOTE — Discharge Instructions (Signed)
Take the meclizine as needed.

## 2016-11-04 NOTE — ED Triage Notes (Signed)
Patient c/o dizziness, fatigue, nausea and not able to eat. Patient states that she saw an eye doctor and was told the she needs to see PCP to have cholesterol checked.  Patient states that when she felt like this 13 years ago had to have blood transfusion.

## 2016-11-04 NOTE — ED Notes (Signed)
PT HAS NOT HAD MEDICATIONS. PT TO CALL IN 15 MINUTES. NO RX WILL DISCHARGE

## 2016-12-12 ENCOUNTER — Ambulatory Visit: Payer: Medicaid Other | Admitting: Family Medicine

## 2016-12-21 ENCOUNTER — Ambulatory Visit: Payer: Medicaid Other | Admitting: Family Medicine

## 2016-12-27 ENCOUNTER — Ambulatory Visit: Payer: Medicaid Other | Admitting: Family Medicine

## 2017-04-20 ENCOUNTER — Encounter (HOSPITAL_COMMUNITY): Payer: Self-pay | Admitting: Nurse Practitioner

## 2017-04-20 ENCOUNTER — Emergency Department (HOSPITAL_COMMUNITY)
Admission: EM | Admit: 2017-04-20 | Discharge: 2017-04-20 | Disposition: A | Discharge status: Home / Self Care | Payer: Medicaid Other | Attending: Emergency Medicine | Admitting: Emergency Medicine

## 2017-04-20 DIAGNOSIS — M25531 Pain in right wrist: Secondary | ICD-10-CM | POA: Insufficient documentation

## 2017-04-20 DIAGNOSIS — Z79899 Other long term (current) drug therapy: Secondary | ICD-10-CM | POA: Insufficient documentation

## 2017-04-20 DIAGNOSIS — F17210 Nicotine dependence, cigarettes, uncomplicated: Secondary | ICD-10-CM | POA: Insufficient documentation

## 2017-04-20 MED ORDER — IBUPROFEN 800 MG PO TABS
800.0000 mg | ORAL_TABLET | Freq: Once | ORAL | Status: AC
  Administered 2017-04-20: 800 mg via ORAL
  Filled 2017-04-20: qty 1

## 2017-04-20 NOTE — ED Provider Notes (Signed)
[facility]ERGENC[facility] DEPT Provider Note   CSN: [id number] Arrival date & time: 04/20/17  0109     History   Chief Complaint Chief Complaint  Patient presents with  . Arm Pain    HPI Kayla Adkins is a 36 y.o. female.  HPI   36 year old female presents today with right arm pain. She reports yesterday while sitting she developed pain in her right hand. She notes this pain spread up to her elbow, described as sharp and shooting. She notes she had a brief episode of inability to squeeze her hand and tingling up into the arm. She denies any significant trauma to the hand or arm, denies any swelling, redness, warmth. Patient notes that sensation has returned, she still has pain in the forearm and wrist, denies any numbness tingling weakness in the hand presently. Patient denies any neck pain, shoulder pain. She has no history of blood clots, no significant risk factors.   History reviewed. No pertinent past medical history.  There are no active problems to display for this patient.   Past Surgical History:  Procedure Laterality Date  . CESAREAN SECTION      OB History    No data available       Home Medications    Prior to Admission medications   Medication Sig Start Date End Date Taking? Authorizing Provider  HYDROcodone-acetaminophen (NORCO/VICODIN) 5-325 MG tablet Take 1 tablet by mouth every 6 (six) hours as needed for severe pain. 09/06/16   Gerhard MunchLockwood, Robert, MD  ibuprofen (ADVIL,MOTRIN) 800 MG tablet Take 1 tablet (800 mg total) by mouth 3 (three) times daily. 03/16/16   Barrett HenleNadeau, Nicole Elizabeth, PA-C  meclizine (ANTIVERT) 25 MG tablet Take 1 tablet (25 mg total) by mouth 3 (three) times daily as needed for dizziness. 11/04/16   Melene PlanFloyd, Dan, DO    Family History History reviewed. No pertinent family history.  Social History Social History  Substance Use Topics  . Smoking status: Current Some Day Smoker    Packs/day: 0.25    Years: 2.00    Types: Cigarettes  .  Smokeless tobacco: Never Used  . Alcohol use Yes     Comment: sometimes     Allergies   Patient has no known allergies.   Review of Systems Review of Systems  All other systems reviewed and are negative.    Physical Exam Updated Vital Signs BP (!) 159/107 (BP Location: Left Arm)   Pulse 73   Temp 98.5 F (36.9 C) (Oral)   Resp 18   Ht 5\' 3"  (1.6 m)   SpO2 100%   Physical Exam  Constitutional: She is oriented to person, place, and time. She appears well-developed and well-nourished.  HENT:  Head: Normocephalic and atraumatic.  Eyes: Conjunctivae are normal. Pupils are equal, round, and reactive to light. Right eye exhibits no discharge. Left eye exhibits no discharge. No scleral icterus.  Neck: Normal range of motion. No JVD present. No tracheal deviation present.  Pulmonary/Chest: Effort normal. No stridor.  Musculoskeletal:  Bilateral upper extremities equal bilateral, no swelling or edema, no warmth to touch. Minor tenderness to palpation of the right proximal wrist and forearm, full active range of motion of the wrist. Pain to the forearm with extension of the wrist. Radial pulse 2+, sensation intact, grip strength 5 out of 5. Remainder of upper extremity atraumatic nontender, full active range of motion of the shoulder. No C or T-spine tenderness to palpation  Neurological: She is alert and oriented to person, place, and time.  Coordination normal.  Psychiatric: She has a normal mood and affect. Her behavior is normal. Judgment and thought content normal.  Nursing note and vitals reviewed.    ED Treatments / Results  Labs (all labs ordered are listed, but only abnormal results are displayed) Labs Reviewed - No data to display  EKG  EKG Interpretation None       Radiology No results found.  Procedures Procedures (including critical care time)  SPLINT APPLICATION Date/Time: 4:10 AM Authorized by: Thermon Leyland Consent: Verbal consent  obtained. Risks and benefits: risks, benefits and alternatives were discussed Consent given by: patient Splint applied by: orthopedic technician Location details: right wrist Splint type: velcro Supplies used: velcro Post-procedure: The splinted body part was neurovascularly unchanged following the procedure. Patient tolerance: Patient tolerated the procedure well with no immediate complications.     Medications Ordered in ED Medications  ibuprofen (ADVIL,MOTRIN) tablet 800 mg (not administered)     Initial Impression / Assessment and Plan / ED Course  I have reviewed the triage vital signs and the nursing notes.  Pertinent labs & imaging results that were available during my care of the patient were reviewed by me and considered in my medical decision making (see chart for details).      Final Clinical Impressions(s) / ED Diagnoses   Final diagnoses:  Right wrist pain    Labs:   Imaging:  Consults:  Therapeutics: Ibuprofen  Discharge Meds:   Assessment/Plan: 84 showed female with right arm pain. This seems to be originating from the wrist. She has no signs of infectious etiology, no signs of vascular etiology, no significant neurological deficits. Patient has no significant tenderness over the carpal tunnel. Patient finds relief with rest. She'll be placed in a wrist splint, given ibuprofen, encouraged ice, elevate, avoid any heavy lifting. She is encouraged follow-up with primary care for symptoms persist return if they worsen. She verbalized understanding and agreement to today's plan had no further questions or concerns.    New Prescriptions New Prescriptions   No medications on file     Eyvonne Mechanic, Cordelia Poche 04/20/17 0410    Ward, Layla Maw, DO 04/20/17 0630

## 2017-04-20 NOTE — ED Triage Notes (Signed)
Pt is c/o severe left arm pain and numbness that she reports onset of about 3 hours ago. Active ROM on the said arm albeit with c/o pain.

## 2017-04-20 NOTE — Discharge Instructions (Signed)
Please read attached information. If you experience any new or worsening signs or symptoms please return to the emergency room for evaluation. Please follow-up with your primary care provider or specialist as discussed.  °

## 2017-04-20 NOTE — ED Notes (Signed)
Pt c/o 10/10 right arm pain not associated with injury. Denies hx of blood clots. No medications have been taken for pain management.

## 2017-05-20 ENCOUNTER — Emergency Department (HOSPITAL_COMMUNITY)
Admission: EM | Admit: 2017-05-20 | Discharge: 2017-05-20 | Disposition: A | Discharge status: Home / Self Care | Payer: Medicaid Other | Attending: Emergency Medicine | Admitting: Emergency Medicine

## 2017-05-20 DIAGNOSIS — R531 Weakness: Secondary | ICD-10-CM | POA: Insufficient documentation

## 2017-05-20 DIAGNOSIS — R110 Nausea: Secondary | ICD-10-CM | POA: Insufficient documentation

## 2017-05-20 DIAGNOSIS — R42 Dizziness and giddiness: Secondary | ICD-10-CM | POA: Insufficient documentation

## 2017-05-20 DIAGNOSIS — H538 Other visual disturbances: Secondary | ICD-10-CM | POA: Insufficient documentation

## 2017-05-20 DIAGNOSIS — F17211 Nicotine dependence, cigarettes, in remission: Secondary | ICD-10-CM | POA: Insufficient documentation

## 2017-05-20 DIAGNOSIS — H5589 Other irregular eye movements: Secondary | ICD-10-CM | POA: Insufficient documentation

## 2017-05-20 LAB — BASIC METABOLIC PANEL
Anion gap: 6 (ref 5–15)
BUN: 8 mg/dL (ref 6–20)
CALCIUM: 9.3 mg/dL (ref 8.9–10.3)
CHLORIDE: 104 mmol/L (ref 101–111)
CO2: 29 mmol/L (ref 22–32)
Creatinine, Ser: 0.69 mg/dL (ref 0.44–1.00)
GFR calc Af Amer: >60 mL/min (ref >60)
GFR calc non Af Amer: >60 mL/min (ref >60)
GLUCOSE: 90 mg/dL (ref 65–99)
Potassium: 3.9 mmol/L (ref 3.5–5.1)
Sodium: 139 mmol/L (ref 135–145)

## 2017-05-20 LAB — CBC
HCT: 38 % (ref 36.0–46.0)
HEMOGLOBIN: 13.1 g/dL (ref 12.0–15.0)
MCH: 32 pg (ref 26.0–34.0)
MCHC: 34.5 g/dL (ref 30.0–36.0)
MCV: 92.7 fL (ref 78.0–100.0)
Platelets: 245 10*3/uL (ref 150–400)
RBC: 4.1 MIL/uL (ref 3.87–5.11)
RDW: 13.6 % (ref 11.5–15.5)
WBC: 6 10*3/uL (ref 4.0–10.5)

## 2017-05-20 LAB — I-STAT BETA HCG BLOOD, ED (MC, WL, AP ONLY): I-stat hCG, quantitative: <5 m[IU]/mL (ref <5)

## 2017-05-20 LAB — CBG MONITORING, ED: GLUCOSE-CAPILLARY: 98 mg/dL (ref 65–99)

## 2017-05-20 MED ORDER — MECLIZINE HCL 25 MG PO TABS: 25.0000 mg | ORAL_TABLET | Freq: Three times a day (TID) | ORAL | 0 refills | Status: DC | PRN

## 2017-05-20 MED ORDER — ONDANSETRON HCL 4 MG PO TABS: 4.0000 mg | ORAL_TABLET | Freq: Four times a day (QID) | ORAL | 0 refills | Status: DC

## 2017-05-20 MED ORDER — ONDANSETRON 4 MG PO TBDP
ORAL_TABLET | ORAL | Status: AC
  Filled 2017-05-20: qty 1

## 2017-05-20 MED ORDER — DEXAMETHASONE 4 MG PO TABS
10.0000 mg | ORAL_TABLET | Freq: Once | ORAL | Status: AC
  Administered 2017-05-20: 10 mg via ORAL
  Filled 2017-05-20: qty 2

## 2017-05-20 MED ORDER — ONDANSETRON 4 MG PO TBDP
4.0000 mg | ORAL_TABLET | Freq: Once | ORAL | Status: AC
  Administered 2017-05-20: 4 mg via ORAL
  Filled 2017-05-20: qty 1

## 2017-05-20 NOTE — ED Provider Notes (Signed)
[facility] DEPT Provider Note   CSN: [id number] Arrival date & time: 05/20/17  1008     History   Chief Complaint Chief Complaint  Patient presents with  . Weakness  . Dizziness    HPI Kayla Adkins is a 36 y.o. female who presents to the emergency department with sudden onset dizziness that began at approximately 3 PM yesterday afternoon and has been constant since. No aggravating or alleviating factors. She reports associated weakness, nausea, and blurred vision. She reports that she is evaluated in the emergency department for a similar episode in the December. She reports tactile fever this AM. She denies chest pain, tinnitus, lightheadedness, abdominal pain, numbness, tingling, or headache. No treatment PTA.    The history is provided by the patient. No language interpreter was used.    No past medical history on file.  There are no active problems to display for this patient.   Past Surgical History:  Procedure Laterality Date  . CESAREAN SECTION      OB History    No data available       Home Medications    Prior to Admission medications   Medication Sig Start Date End Date Taking? Authorizing Provider  meclizine (ANTIVERT) 25 MG tablet Take 1 tablet (25 mg total) by mouth 3 (three) times daily as needed for dizziness. 05/20/17   Kennard Fildes A, PA-C  ondansetron (ZOFRAN) 4 MG tablet Take 1 tablet (4 mg total) by mouth every 6 (six) hours. 05/20/17   Lamesha Tibbits A, PA-C    Family History No family history on file.  Social History Social History  Substance Use Topics  . Smoking status: Current Some Day Smoker    Packs/day: 0.25    Years: 2.00    Types: Cigarettes  . Smokeless tobacco: Never Used  . Alcohol use Yes     Comment: sometimes     Allergies   Patient has no known allergies.   Review of Systems Review of Systems  Eyes: Positive for visual disturbance.  Respiratory: Negative for shortness of breath.   Neurological: Positive  for dizziness and weakness. Negative for tremors, seizures, syncope, speech difficulty, light-headedness, numbness and headaches.    Physical Exam Updated Vital Signs BP 127/88   Pulse 67   Temp 98.8 F (37.1 C) (Oral)   Resp (!) 21   SpO2 100%   Physical Exam  Constitutional: No distress.  HENT:  Head: Normocephalic.  Eyes: Conjunctivae are normal. Pupils are equal, round, and reactive to light. Right eye exhibits nystagmus. Left eye exhibits no nystagmus.  Neck: Neck supple.  Cardiovascular: Normal rate and regular rhythm.  Exam reveals no gallop and no friction rub.   No murmur heard. Pulmonary/Chest: Effort normal. No respiratory distress.  Abdominal: Soft. She exhibits no distension. There is no tenderness.  Neurological: She is alert.  Cranial nerves 2-12 intact. Finger-to-nose is normal. 5/5 motor strength of the bilateral upper and lower extremities. Moves all four extremities. Negative Romberg. Ambulatory without difficulty. NVI.    Skin: Skin is warm. No rash noted.  Psychiatric: Her behavior is normal.  Nursing note and vitals reviewed.    ED Treatments / Results  Labs (all labs ordered are listed, but only abnormal results are displayed) Labs Reviewed  BASIC METABOLIC PANEL  CBC  CBG MONITORING, ED  I-STAT BETA HCG BLOOD, ED ([facility], [facility], [facility] ONLY)    EKG  EKG Interpretation None       Radiology No results found.  Procedures Procedures (  including critical care time)  Medications Ordered in ED Medications  ondansetron (ZOFRAN-ODT) disintegrating tablet 4 mg (4 mg Oral Given 05/20/17 1551)  dexamethasone (DECADRON) tablet 10 mg (10 mg Oral Given 05/20/17 1551)     Initial Impression / Assessment and Plan / ED Course  I have reviewed the triage vital signs and the nursing notes.  Pertinent labs & imaging results that were available during my care of the patient were reviewed by me and considered in my medical decision making (see chart for  details).     Patient with unilateral nystagmus, negative skew test and normal neurologic exam.  No vertical or rotational nystagmus. Patient normal finger-nose and normal gait.  No slurred speech renal or weakness.  Doubt CVA or other central cause of vertigo.  History and physical consistent with peripheral vertigo symptoms. Dexamethasone given in the ED, and the patient reports significant improvement. Will discharge home with meclizine. Patient instructed to followup with her primary care physician or neurology for further evaluation. They are to return to the emergency department for new neurologic symptoms, loss of vision or other concerning symptoms.  Final Clinical Impressions(s) / ED Diagnoses   Final diagnoses:  Vertigo    New Prescriptions Discharge Medication List as of 05/20/2017  4:37 PM    START taking these medications   Details  ondansetron (ZOFRAN) 4 MG tablet Take 1 tablet (4 mg total) by mouth every 6 (six) hours., Starting Mon 05/20/2017, Print         Keandrea Tapley A, PA-C 05/21/17 2108    Jerelyn ScottLinker, Martha, MD 05/21/17 2126

## 2017-05-20 NOTE — ED Notes (Addendum)
CBG 98  

## 2017-05-20 NOTE — Discharge Instructions (Signed)
Please call the number on her discharge paperwork to get established with a primary care provider and schedule an appointment to have her blood pressure rechecked. You can call to schedule a follow up visit from today's appointment with the neurologist, Dr. Amada JupiterKirkpatrick. If he develop new or worsening symptoms including severe headache, fever, new numbness, weakness, or tingling, please return to the emergency department for reevaluation.

## 2017-05-20 NOTE — ED Notes (Signed)
Patient attempted to urinate, but was unsuccessful. Will try again.

## 2017-05-20 NOTE — ED Triage Notes (Signed)
Pt reports that she began feeling weak, dizzy, and episodes of emesis that started yesterday. Pt is A+OX4, speaking in complete sentences, ambulatory w/ normal gait to triage.

## 2017-07-14 ENCOUNTER — Emergency Department (HOSPITAL_COMMUNITY)
Admission: EM | Admit: 2017-07-14 | Discharge: 2017-07-14 | Disposition: A | Discharge status: Home / Self Care | Payer: Self-pay | Attending: Emergency Medicine | Admitting: Emergency Medicine

## 2017-07-14 ENCOUNTER — Other Ambulatory Visit: Payer: Self-pay

## 2017-07-14 ENCOUNTER — Emergency Department (HOSPITAL_COMMUNITY): Payer: Self-pay

## 2017-07-14 ENCOUNTER — Encounter (HOSPITAL_COMMUNITY): Payer: Self-pay | Admitting: Emergency Medicine

## 2017-07-14 DIAGNOSIS — Y9389 Activity, other specified: Secondary | ICD-10-CM | POA: Insufficient documentation

## 2017-07-14 DIAGNOSIS — F17210 Nicotine dependence, cigarettes, uncomplicated: Secondary | ICD-10-CM | POA: Insufficient documentation

## 2017-07-14 DIAGNOSIS — W109XXA Fall (on) (from) unspecified stairs and steps, initial encounter: Secondary | ICD-10-CM | POA: Insufficient documentation

## 2017-07-14 DIAGNOSIS — Y929 Unspecified place or not applicable: Secondary | ICD-10-CM | POA: Insufficient documentation

## 2017-07-14 DIAGNOSIS — S39012A Strain of muscle, fascia and tendon of lower back, initial encounter: Secondary | ICD-10-CM | POA: Insufficient documentation

## 2017-07-14 DIAGNOSIS — Y998 Other external cause status: Secondary | ICD-10-CM | POA: Insufficient documentation

## 2017-07-14 DIAGNOSIS — R55 Syncope and collapse: Secondary | ICD-10-CM

## 2017-07-14 HISTORY — DX: Dizziness and giddiness: R42

## 2017-07-14 LAB — URINALYSIS, ROUTINE W REFLEX MICROSCOPIC
BACTERIA UA: NONE SEEN
Bilirubin Urine: NEGATIVE
Glucose, UA: NEGATIVE mg/dL
Ketones, ur: NEGATIVE mg/dL
NITRITE: NEGATIVE
Protein, ur: 30 mg/dL — AB
SPECIFIC GRAVITY, URINE: 1.025 (ref 1.005–1.030)
pH: 6 (ref 5.0–8.0)

## 2017-07-14 LAB — BASIC METABOLIC PANEL
Anion gap: 7 (ref 5–15)
BUN: 8 mg/dL (ref 6–20)
CO2: 26 mmol/L (ref 22–32)
Calcium: 9.1 mg/dL (ref 8.9–10.3)
Chloride: 103 mmol/L (ref 101–111)
Creatinine, Ser: 0.76 mg/dL (ref 0.44–1.00)
GFR calc Af Amer: >60 mL/min (ref >60)
GLUCOSE: 96 mg/dL (ref 65–99)
POTASSIUM: 3.5 mmol/L (ref 3.5–5.1)
Sodium: 136 mmol/L (ref 135–145)

## 2017-07-14 LAB — CBC
HEMATOCRIT: 37.2 % (ref 36.0–46.0)
Hemoglobin: 12.5 g/dL (ref 12.0–15.0)
MCH: 31.2 pg (ref 26.0–34.0)
MCHC: 33.6 g/dL (ref 30.0–36.0)
MCV: 92.8 fL (ref 78.0–100.0)
Platelets: 234 10*3/uL (ref 150–400)
RBC: 4.01 MIL/uL (ref 3.87–5.11)
RDW: 13.3 % (ref 11.5–15.5)
WBC: 5.1 10*3/uL (ref 4.0–10.5)

## 2017-07-14 LAB — PREGNANCY, URINE: PREG TEST UR: NEGATIVE

## 2017-07-14 MED ORDER — HYDROCODONE-ACETAMINOPHEN 5-325 MG PO TABS: 1.0000 | ORAL_TABLET | ORAL | 0 refills | Status: DC | PRN

## 2017-07-14 MED ORDER — CYCLOBENZAPRINE HCL 10 MG PO TABS: 10.0000 mg | ORAL_TABLET | Freq: Two times a day (BID) | ORAL | 0 refills | Status: DC | PRN

## 2017-07-14 MED ORDER — SODIUM CHLORIDE 0.9 % IV BOLUS (SEPSIS)
1000.0000 mL | Freq: Once | INTRAVENOUS | Status: AC
  Administered 2017-07-14: 1000 mL via INTRAVENOUS

## 2017-07-14 MED ORDER — IBUPROFEN 600 MG PO TABS: 600.0000 mg | ORAL_TABLET | Freq: Four times a day (QID) | ORAL | 0 refills | Status: DC | PRN

## 2017-07-14 MED ORDER — ONDANSETRON HCL 4 MG/2ML IJ SOLN
4.0000 mg | Freq: Once | INTRAMUSCULAR | Status: AC
  Administered 2017-07-14: 4 mg via INTRAVENOUS
  Filled 2017-07-14: qty 2

## 2017-07-14 MED ORDER — MORPHINE SULFATE (PF) 4 MG/ML IV SOLN
4.0000 mg | Freq: Once | INTRAVENOUS | Status: AC
  Administered 2017-07-14: 4 mg via INTRAVENOUS
  Filled 2017-07-14: qty 1

## 2017-07-14 NOTE — ED Provider Notes (Signed)
[facility]-EMERGENCY DEPT Provider Note   CSN: [id number] Arrival date & time: 07/14/17  1203     History   Chief Complaint Chief Complaint  Patient presents with  . Back Pain  . Loss of Consciousness    HPI Kayla Adkins is a 36 y.o. female.  Pt presents to the ED today with back pain and loc.  The pt said she got dizzy and fell backwards and fell down the stairs.  She said that she hit her back and has low back pain.  The pt has been able to walk, but it hurts as well.  The pt does not think she hit her head.      Past Medical History:  Diagnosis Date  . Vertigo     There are no active problems to display for this patient.   Past Surgical History:  Procedure Laterality Date  . CESAREAN SECTION      OB History    No data available       Home Medications    Prior to Admission medications   Medication Sig Start Date End Date Taking? Authorizing Provider  cyclobenzaprine (FLEXERIL) 10 MG tablet Take 1 tablet (10 mg total) by mouth 2 (two) times daily as needed for muscle spasms. 07/14/17   Jacalyn LefevreHaviland, Laiza Veenstra, MD  HYDROcodone-acetaminophen (NORCO/VICODIN) 5-325 MG tablet Take 1 tablet by mouth every 4 (four) hours as needed. 07/14/17   Jacalyn LefevreHaviland, Eshawn Coor, MD  ibuprofen (ADVIL,MOTRIN) 600 MG tablet Take 1 tablet (600 mg total) by mouth every 6 (six) hours as needed. 07/14/17   Jacalyn LefevreHaviland, Narelle Schoening, MD  meclizine (ANTIVERT) 25 MG tablet Take 1 tablet (25 mg total) by mouth 3 (three) times daily as needed for dizziness. 05/20/17   McDonald, Mia A, PA-C  ondansetron (ZOFRAN) 4 MG tablet Take 1 tablet (4 mg total) by mouth every 6 (six) hours. 05/20/17   McDonald, Mia A, PA-C    Family History No family history on file.  Social History Social History  Substance Use Topics  . Smoking status: Current Some Day Smoker    Packs/day: 0.25    Years: 2.00    Types: Cigarettes  . Smokeless tobacco: Never Used  . Alcohol use Yes     Comment: sometimes     Allergies   Patient has no  known allergies.   Review of Systems Review of Systems  Musculoskeletal: Positive for back pain.  All other systems reviewed and are negative.    Physical Exam Updated Vital Signs BP (!) 149/104 (BP Location: Right Arm)   Pulse 79   Temp 98.3 F (36.8 C) (Oral)   Resp 20   LMP 07/03/2017 Comment: Negative Preg test  SpO2 100%   Physical Exam  Constitutional: She is oriented to person, place, and time. She appears well-developed and well-nourished.  HENT:  Head: Normocephalic and atraumatic.  Right Ear: External ear normal.  Left Ear: External ear normal.  Nose: Nose normal.  Mouth/Throat: Oropharynx is clear and moist.  Eyes: Pupils are equal, round, and reactive to light. Conjunctivae and EOM are normal.  Neck: Normal range of motion. Neck supple.  Cardiovascular: Normal rate, regular rhythm, normal heart sounds and intact distal pulses.   Pulmonary/Chest: Effort normal and breath sounds normal.  Abdominal: Soft. Bowel sounds are normal.  Musculoskeletal:       Lumbar back: She exhibits bony tenderness.  Neurological: She is alert and oriented to person, place, and time.  Skin: Skin is warm.  Psychiatric: She has a normal mood  and affect. Her behavior is normal. Judgment and thought content normal.  Nursing note and vitals reviewed.    ED Treatments / Results  Labs (all labs ordered are listed, but only abnormal results are displayed) Labs Reviewed  URINALYSIS, ROUTINE W REFLEX MICROSCOPIC - Abnormal; Notable for the following:       Result Value   APPearance HAZY (*)    Hgb urine dipstick SMALL (*)    Protein, ur 30 (*)    Leukocytes, UA TRACE (*)    Squamous Epithelial / LPF 0-5 (*)    All other components within normal limits  BASIC METABOLIC PANEL  CBC  PREGNANCY, URINE    EKG  EKG Interpretation  Date/Time:  Sunday July 14 2017 12:39:47 EDT Ventricular Rate:  71 PR Interval:  150 QRS Duration: 84 QT Interval:  390 QTC Calculation: 423 R  Axis:   15 Text Interpretation:  Normal sinus rhythm Normal ECG Confirmed by Jacalyn Lefevre [id number]) on 07/14/2017 1:42:51 PM       Radiology Dg Lumbar Spine Complete  Result Date: 07/14/2017 CLINICAL DATA:  36 year old female with history of trauma from a fall down stairs yesterday evening. Low back pain. EXAM: LUMBAR SPINE - COMPLETE 4+ VIEW COMPARISON:  No priors. FINDINGS: There is no evidence of lumbar spine fracture. Alignment is normal. Intervertebral disc spaces are maintained. IMPRESSION: Negative. Electronically Signed   By: Trudie Reed M.D.   On: 07/14/2017 14:33   Dg Sacrum/coccyx  Result Date: 07/14/2017 CLINICAL DATA:  36 year old female with history of trauma from a fall down several stairs yesterday evening. Low back and pelvic pain. EXAM: SACRUM AND COCCYX - 2+ VIEW COMPARISON:  No priors. FINDINGS: There is no evidence of fracture or other focal bone lesions. IMPRESSION: Negative. Electronically Signed   By: Trudie Reed M.D.   On: 07/14/2017 14:34    Procedures Procedures (including critical care time)  Medications Ordered in ED Medications  sodium chloride 0.9 % bolus 1,000 mL (1,000 mLs Intravenous New Bag/Given 07/14/17 1348)  morphine 4 MG/ML injection 4 mg (4 mg Intravenous Given 07/14/17 1349)  ondansetron (ZOFRAN) injection 4 mg (4 mg Intravenous Given 07/14/17 1349)     Initial Impression / Assessment and Plan / ED Course  I have reviewed the triage vital signs and the nursing notes.  Pertinent labs & imaging results that were available during my care of the patient were reviewed by me and considered in my medical decision making (see chart for details).    Pt feels better.  She is able to ambulate.  She knows to return if worse.    Final Clinical Impressions(s) / ED Diagnoses   Final diagnoses:  Strain of lumbar region, initial encounter  Syncope, unspecified syncope type    New Prescriptions New Prescriptions   CYCLOBENZAPRINE (FLEXERIL)  10 MG TABLET    Take 1 tablet (10 mg total) by mouth 2 (two) times daily as needed for muscle spasms.   HYDROCODONE-ACETAMINOPHEN (NORCO/VICODIN) 5-325 MG TABLET    Take 1 tablet by mouth every 4 (four) hours as needed.   IBUPROFEN (ADVIL,MOTRIN) 600 MG TABLET    Take 1 tablet (600 mg total) by mouth every 6 (six) hours as needed.     Jacalyn Lefevre, MD 07/14/17 [id number]

## 2017-07-14 NOTE — ED Triage Notes (Signed)
Pt states she has a hx of vertigo and states she maybe got dizzy and fell backwards onto her back, pt states she thinks she might have blacked out but did not hit her head. C/o lower lumber back pain at this time. Denies dizziness at this time. Thinks she had a vertigo spell.

## 2017-07-14 NOTE — ED Notes (Signed)
Vitals signs entered by mistake.

## 2017-10-05 IMAGING — CR DG LUMBAR SPINE COMPLETE 4+V
5 series · 5 of 5 positions shown · non-contrast
Comparison: No priors.

CLINICAL DATA: 35-year-old female with history of trauma from a
fall down stairs yesterday evening. Low back pain.

EXAM:
LUMBAR SPINE - COMPLETE 4+ VIEW

[l-spine ap]
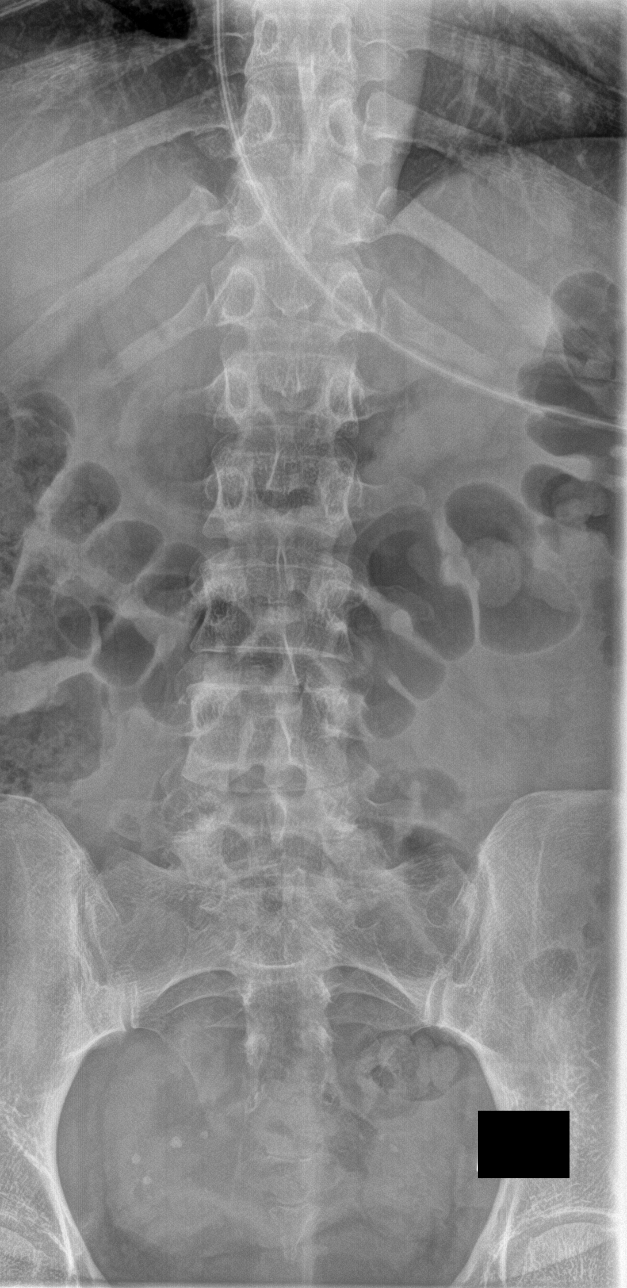

[l-spine obl (1 of 2)]
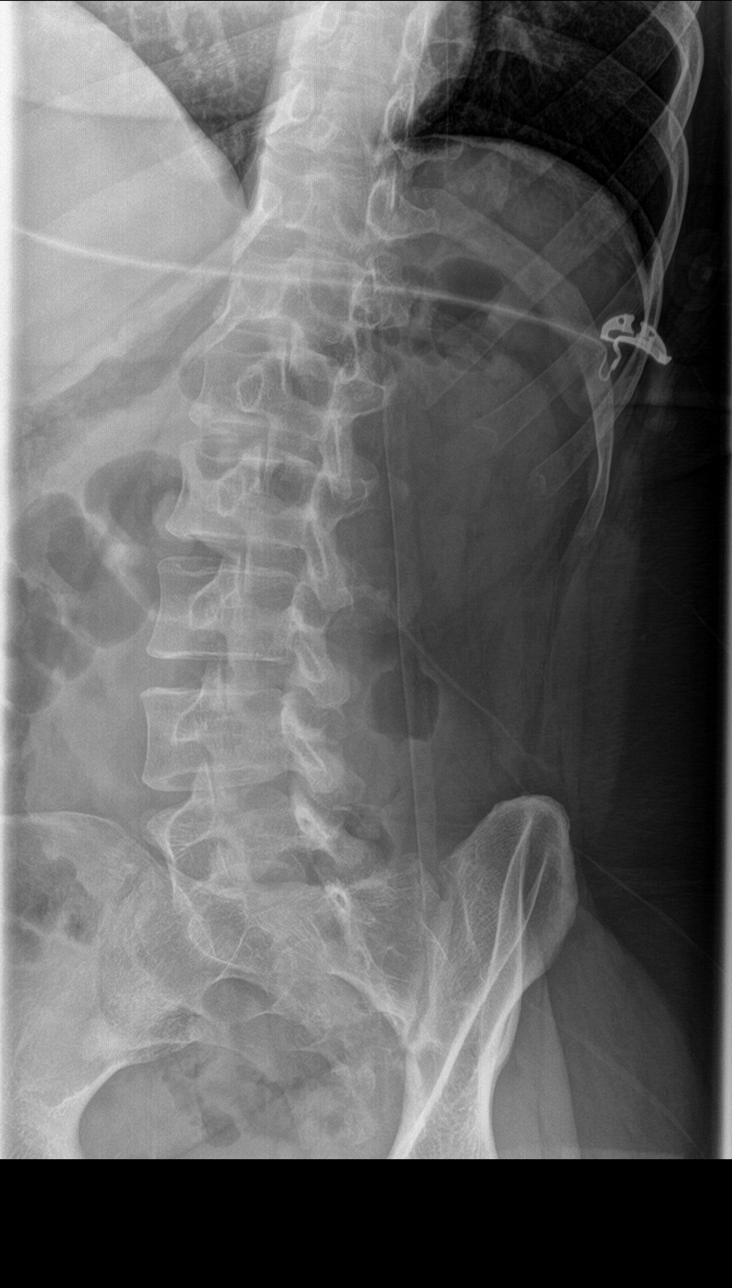

[l-spine obl (2 of 2)]
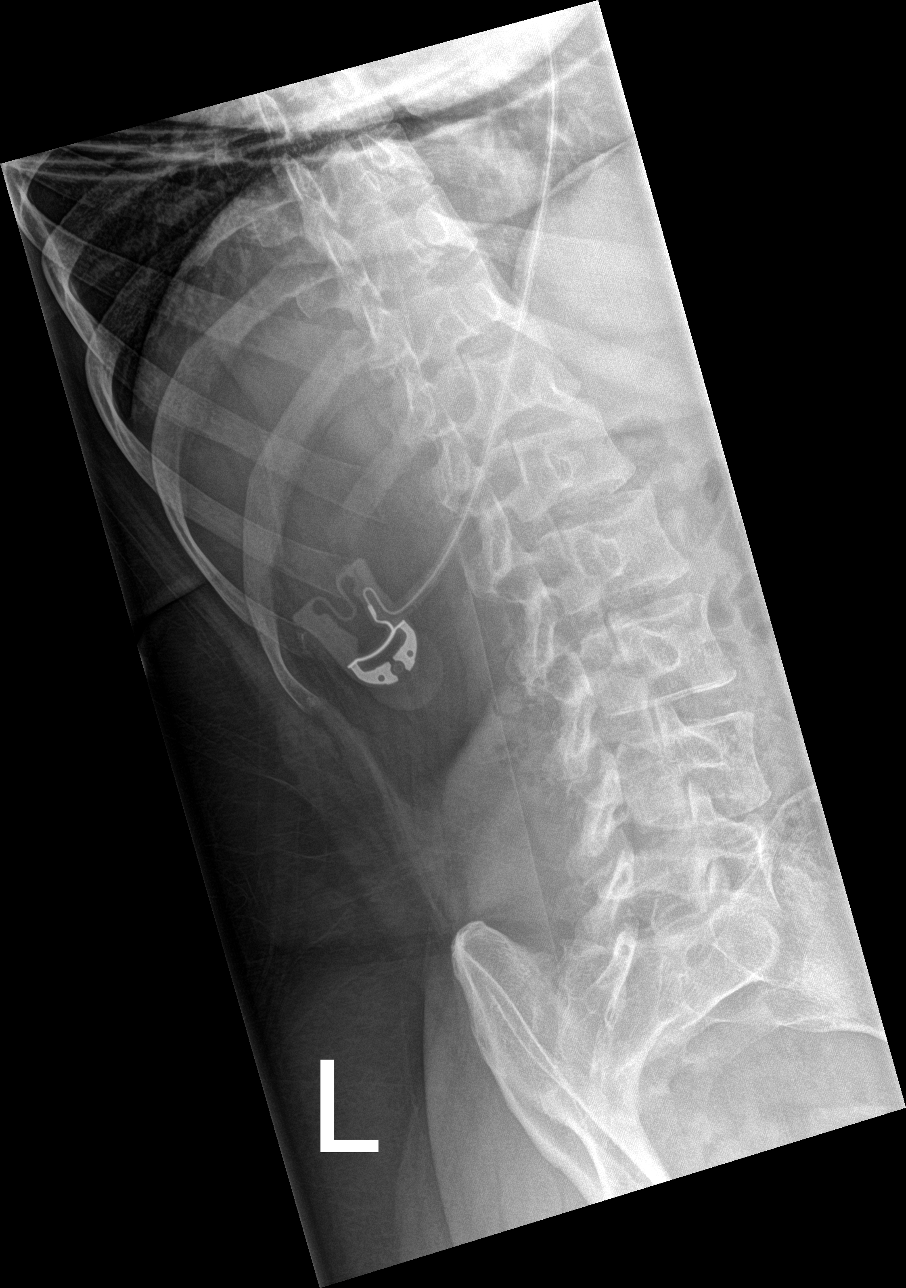

[l-spine lat]
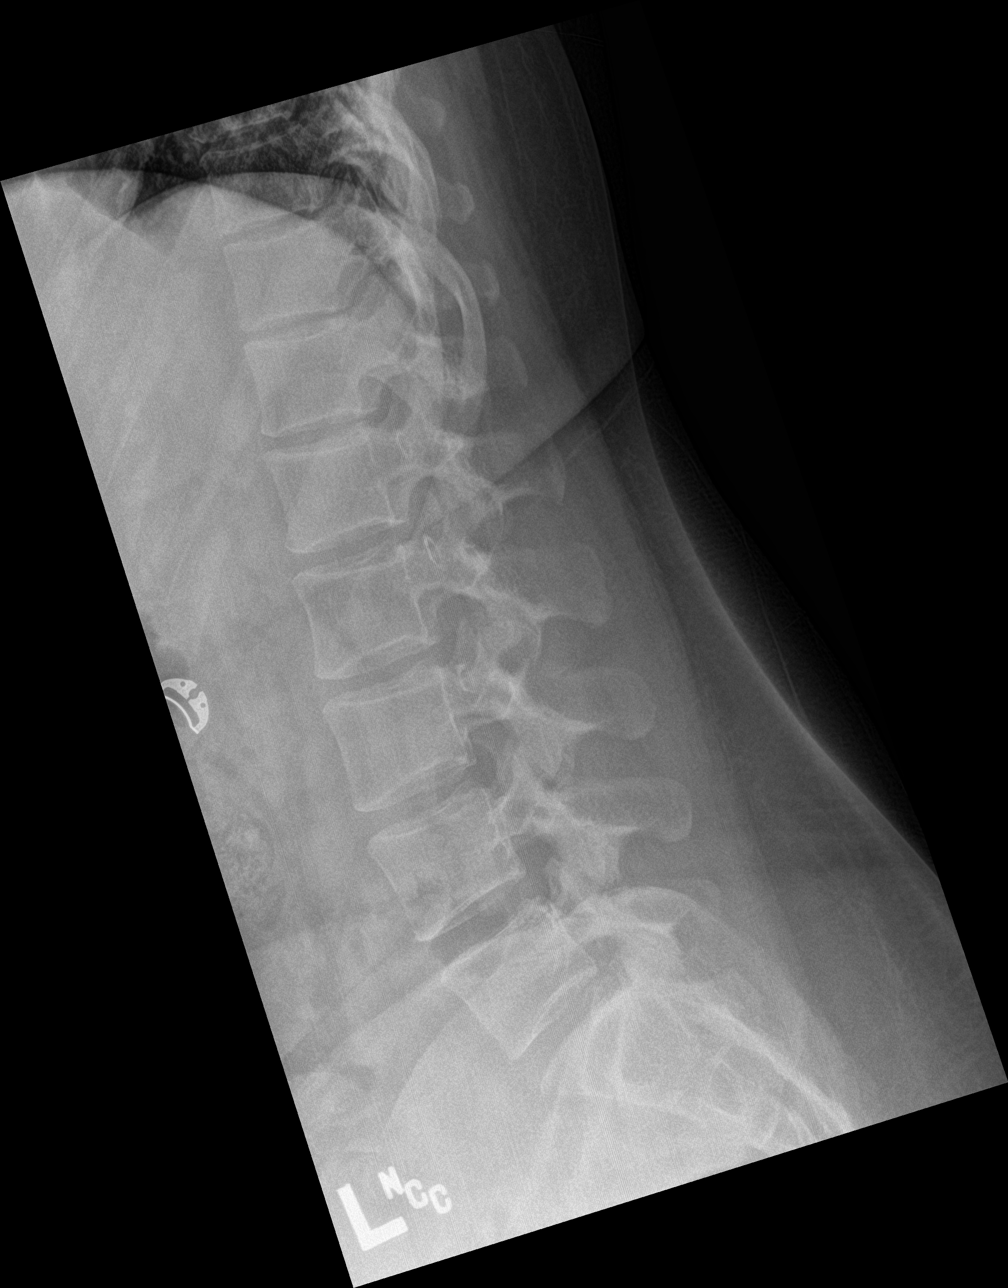

[l-spine spot]
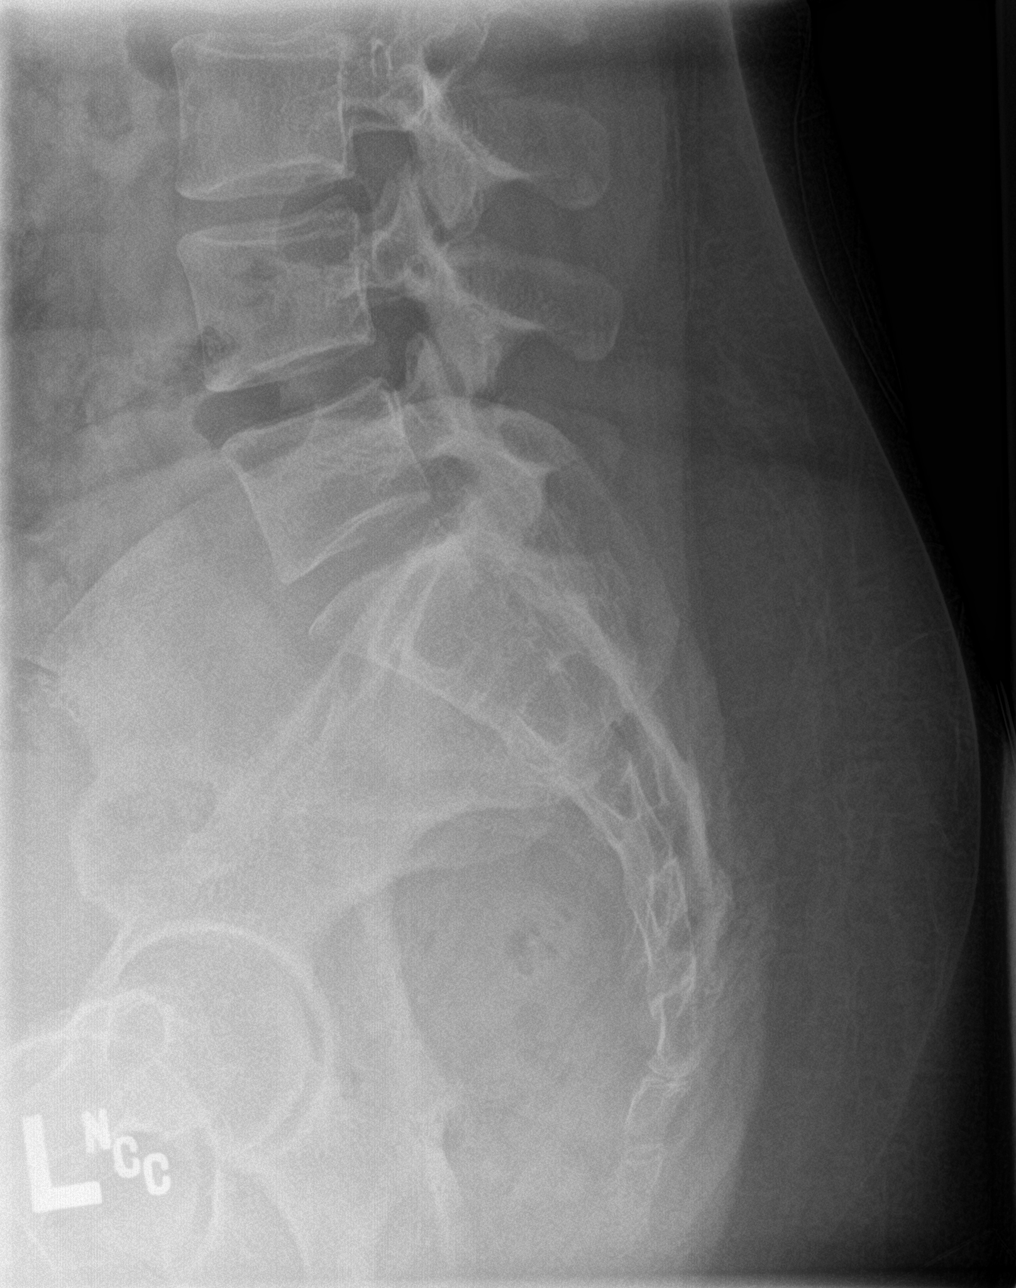

[5 of 5 positions shown; findings below may reference images not displayed]

FINDINGS: There is no evidence of lumbar spine fracture. Alignment is normal.
Intervertebral disc spaces are maintained.
IMPRESSION: Negative.

## 2018-07-14 ENCOUNTER — Emergency Department (HOSPITAL_COMMUNITY)
Admission: EM | Admit: 2018-07-14 | Discharge: 2018-07-14 | Disposition: A | Discharge status: Home / Self Care | Payer: Medicaid Other | Attending: Emergency Medicine | Admitting: Emergency Medicine

## 2018-07-14 ENCOUNTER — Encounter (HOSPITAL_COMMUNITY): Payer: Self-pay | Admitting: Emergency Medicine

## 2018-07-14 DIAGNOSIS — R110 Nausea: Secondary | ICD-10-CM

## 2018-07-14 DIAGNOSIS — D72819 Decreased white blood cell count, unspecified: Secondary | ICD-10-CM | POA: Insufficient documentation

## 2018-07-14 DIAGNOSIS — R509 Fever, unspecified: Secondary | ICD-10-CM | POA: Diagnosis not present

## 2018-07-14 DIAGNOSIS — H9202 Otalgia, left ear: Secondary | ICD-10-CM | POA: Diagnosis present

## 2018-07-14 HISTORY — DX: Essential (primary) hypertension: I10

## 2018-07-14 LAB — COMPREHENSIVE METABOLIC PANEL
ALK PHOS: 55 U/L (ref 38–126)
ALT: 13 U/L (ref 0–44)
ANION GAP: 12 (ref 5–15)
AST: 31 U/L (ref 15–41)
Albumin: 3.7 g/dL (ref 3.5–5.0)
BILIRUBIN TOTAL: 1.1 mg/dL (ref 0.3–1.2)
BUN: 6 mg/dL (ref 6–20)
CALCIUM: 8.8 mg/dL — AB (ref 8.9–10.3)
CO2: 24 mmol/L (ref 22–32)
Chloride: 97 mmol/L — ABNORMAL LOW (ref 98–111)
Creatinine, Ser: 0.96 mg/dL (ref 0.44–1.00)
Glucose, Bld: 126 mg/dL — ABNORMAL HIGH (ref 70–99)
POTASSIUM: 3.1 mmol/L — AB (ref 3.5–5.1)
Sodium: 133 mmol/L — ABNORMAL LOW (ref 135–145)
Total Protein: 7 g/dL (ref 6.5–8.1)

## 2018-07-14 LAB — CBC WITH DIFFERENTIAL/PLATELET
Abs Immature Granulocytes: 0 10*3/uL (ref 0.0–0.1)
Basophils Absolute: 0 10*3/uL (ref 0.0–0.1)
Basophils Relative: 0 %
EOS PCT: 0 %
Eosinophils Absolute: 0 10*3/uL (ref 0.0–0.7)
HEMATOCRIT: 35.3 % — AB (ref 36.0–46.0)
HEMOGLOBIN: 11.8 g/dL — AB (ref 12.0–15.0)
Immature Granulocytes: 0 %
LYMPHS ABS: 0.7 10*3/uL (ref 0.7–4.0)
LYMPHS PCT: 25 %
MCH: 31.1 pg (ref 26.0–34.0)
MCHC: 33.4 g/dL (ref 30.0–36.0)
MCV: 93.1 fL (ref 78.0–100.0)
MONO ABS: 0.3 10*3/uL (ref 0.1–1.0)
Monocytes Relative: 11 %
Neutro Abs: 1.9 10*3/uL (ref 1.7–7.7)
Neutrophils Relative %: 64 %
Platelets: 157 10*3/uL (ref 150–400)
RBC: 3.79 MIL/uL — AB (ref 3.87–5.11)
RDW: 12.9 % (ref 11.5–15.5)
WBC: 2.9 10*3/uL — AB (ref 4.0–10.5)

## 2018-07-14 LAB — URINALYSIS, ROUTINE W REFLEX MICROSCOPIC
BILIRUBIN URINE: NEGATIVE
Glucose, UA: NEGATIVE mg/dL
KETONES UR: NEGATIVE mg/dL
LEUKOCYTES UA: NEGATIVE
NITRITE: NEGATIVE
Protein, ur: NEGATIVE mg/dL
SPECIFIC GRAVITY, URINE: 1.009 (ref 1.005–1.030)
pH: 6 (ref 5.0–8.0)

## 2018-07-14 LAB — PREGNANCY, URINE: PREG TEST UR: NEGATIVE

## 2018-07-14 LAB — LIPASE, BLOOD: LIPASE: 46 U/L (ref 11–51)

## 2018-07-14 MED ORDER — ONDANSETRON 4 MG PO TBDP: 4.0000 mg | ORAL_TABLET | Freq: Three times a day (TID) | ORAL | 0 refills | Status: DC | PRN

## 2018-07-14 MED ORDER — ACETAMINOPHEN 325 MG PO TABS
650.0000 mg | ORAL_TABLET | Freq: Once | ORAL | Status: AC | PRN
  Administered 2018-07-14: 650 mg via ORAL
  Filled 2018-07-14: qty 2

## 2018-07-14 MED ORDER — KETOROLAC TROMETHAMINE 30 MG/ML IJ SOLN
30.0000 mg | Freq: Once | INTRAMUSCULAR | Status: AC
  Administered 2018-07-14: 30 mg via INTRAVENOUS
  Filled 2018-07-14: qty 1

## 2018-07-14 MED ORDER — ONDANSETRON HCL 4 MG/2ML IJ SOLN
4.0000 mg | Freq: Once | INTRAMUSCULAR | Status: AC
  Administered 2018-07-14: 4 mg via INTRAVENOUS
  Filled 2018-07-14: qty 2

## 2018-07-14 MED ORDER — MELOXICAM 7.5 MG PO TABS: 15.0000 mg | ORAL_TABLET | Freq: Every day | ORAL | 0 refills | Status: DC

## 2018-07-14 MED ORDER — SODIUM CHLORIDE 0.9 % IV BOLUS
1000.0000 mL | Freq: Once | INTRAVENOUS | Status: AC
  Administered 2018-07-14: 1000 mL via INTRAVENOUS

## 2018-07-14 NOTE — ED Triage Notes (Signed)
Patient complains of generalized body aches x3 days with greatest amount of pain in left ear. Temp 102.56F in triage. Patient states she was able to tolerate PO Coke yesterday. Patient alert, oriented, and ambulating independently with steady gait.

## 2018-07-14 NOTE — ED Notes (Signed)
Spoke with Hyacinth MeekerMiller MD, gave me a verbal order for Toradol 30 mg IV.

## 2018-07-14 NOTE — ED Provider Notes (Signed)
[facility] Provider Note   CSN: [id number] Arrival date & time: 07/14/18  [id number]     History   Chief Complaint Chief Complaint  Patient presents with  . Generalized Body Aches    HPI Kayla Adkins is a 37 y.o. female.  HPI  37 year old female, she has a history of high blood pressure, she does not take any medications, she does not smoke cigarettes and takes only the occasional drink of alcohol.  She reports that over the last 3 days she has had the development of left ear pain which is associated with a fever as high as 102 she thinks, she has had no coughing, no sore throat, no diarrhea and no urinary symptoms.  She is felt nauseated and has had very little to eat or drink the last 3 days.  Even water makes her stomach upset.  No travel, no recent surgery, no rashes, no tick bites, no other complaints.  She reports that there is a girl at work that sits next to her that has had similar symptoms yesterday.  The patient has had no medications prior to arrival, symptoms are persistent, nothing makes this better or worse.  She does not swim, does not have pain with touching her ear or laying on her ear.  Past Medical History:  Diagnosis Date  . Hypertension   . Vertigo     There are no active problems to display for this patient.   Past Surgical History:  Procedure Laterality Date  . CESAREAN SECTION       OB History   None      Home Medications    Prior to Admission medications   Medication Sig Start Date End Date Taking? Authorizing Provider  cyclobenzaprine (FLEXERIL) 10 MG tablet Take 1 tablet (10 mg total) by mouth 2 (two) times daily as needed for muscle spasms. 07/14/17   Jacalyn LefevreHaviland, Julie, MD  HYDROcodone-acetaminophen (NORCO/VICODIN) 5-325 MG tablet Take 1 tablet by mouth every 4 (four) hours as needed. 07/14/17   Jacalyn LefevreHaviland, Julie, MD  ibuprofen (ADVIL,MOTRIN) 600 MG tablet Take 1 tablet (600 mg total) by mouth every 6 (six)  hours as needed. 07/14/17   Jacalyn LefevreHaviland, Julie, MD  meclizine (ANTIVERT) 25 MG tablet Take 1 tablet (25 mg total) by mouth 3 (three) times daily as needed for dizziness. 05/20/17   McDonald, Mia A, PA-C  meloxicam (MOBIC) 7.5 MG tablet Take 2 tablets (15 mg total) by mouth daily. 07/14/18   Eber HongMiller, Feliza Diven, MD  ondansetron (ZOFRAN ODT) 4 MG disintegrating tablet Take 1 tablet (4 mg total) by mouth every 8 (eight) hours as needed for nausea. 07/14/18   Eber HongMiller, Chatara Lucente, MD  ondansetron (ZOFRAN) 4 MG tablet Take 1 tablet (4 mg total) by mouth every 6 (six) hours. 05/20/17   McDonald, Coral ElseMia A, PA-C    Family History History reviewed. No pertinent family history.  Social History Social History   Tobacco Use  . Smoking status: Current Some Day Smoker    Packs/day: 0.25    Years: 2.00    Pack years: 0.50    Types: Cigarettes  . Smokeless tobacco: Never Used  Substance Use Topics  . Alcohol use: Yes    Comment: sometimes  . Drug use: No     Allergies   Patient has no known allergies.   Review of Systems Review of Systems  All other systems reviewed and are negative.    Physical Exam Updated Vital Signs BP (!) 152/70   Pulse  94   Temp (!) 102.2 F (39 C) (Oral)   Resp 17   SpO2 100%   Physical Exam  Constitutional: She appears well-developed and well-nourished. No distress.  HENT:  Head: Normocephalic and atraumatic.  Mouth/Throat: Oropharynx is clear and moist. No oropharyngeal exudate.  Clear oropharynx, clear tympanic membranes bilaterally but no tenderness with manipulation of the auricle of the tragus  Eyes: Pupils are equal, round, and reactive to light. Conjunctivae and EOM are normal. Right eye exhibits no discharge. Left eye exhibits no discharge. No scleral icterus.  Neck: Normal range of motion. Neck supple. No JVD present. No thyromegaly present.  Cardiovascular: Normal rate, regular rhythm, normal heart sounds and intact distal pulses. Exam reveals no gallop and no  friction rub.  No murmur heard. Pulmonary/Chest: Effort normal and breath sounds normal. No respiratory distress. She has no wheezes. She has no rales.  Abdominal: Soft. Bowel sounds are normal. She exhibits no distension and no mass. There is no tenderness.  No abdominal tenderness, mild right CVA tenderness  Musculoskeletal: Normal range of motion. She exhibits no edema or tenderness.  Lymphadenopathy:    She has no cervical adenopathy.  Neurological: She is alert. Coordination normal.  Skin: Skin is warm and dry. No rash noted. No erythema.  Psychiatric: She has a normal mood and affect. Her behavior is normal.  Nursing note and vitals reviewed.    ED Treatments / Results  Labs (all labs ordered are listed, but only abnormal results are displayed) Labs Reviewed  URINALYSIS, ROUTINE W REFLEX MICROSCOPIC - Abnormal; Notable for the following components:      Result Value   APPearance HAZY (*)    Hgb urine dipstick MODERATE (*)    Bacteria, UA RARE (*)    All other components within normal limits  CBC WITH DIFFERENTIAL/PLATELET - Abnormal; Notable for the following components:   WBC 2.9 (*)    RBC 3.79 (*)    Hemoglobin 11.8 (*)    HCT 35.3 (*)    All other components within normal limits  COMPREHENSIVE METABOLIC PANEL - Abnormal; Notable for the following components:   Sodium 133 (*)    Potassium 3.1 (*)    Chloride 97 (*)    Glucose, Bld 126 (*)    Calcium 8.8 (*)    All other components within normal limits  URINE CULTURE  PREGNANCY, URINE  LIPASE, BLOOD    EKG None  Radiology No results found.  Procedures Procedures (including critical care time)  Medications Ordered in ED Medications  acetaminophen (TYLENOL) tablet 650 mg (650 mg Oral Given 07/14/18 0748)  sodium chloride 0.9 % bolus 1,000 mL (1,000 mLs Intravenous New Bag/Given 07/14/18 0833)  ondansetron (ZOFRAN) injection 4 mg (4 mg Intravenous Given 07/14/18 0833)  ketorolac (TORADOL) 30 MG/ML injection  30 mg (30 mg Intravenous Given 07/14/18 0911)     Initial Impression / Assessment and Plan / ED Course  I have reviewed the triage vital signs and the nursing notes.  Pertinent labs & imaging results that were available during my care of the patient were reviewed by me and considered in my medical decision making (see chart for details).  Clinical Course as of Jul 14 1022  Mon Jul 14, 2018  1020 The patient is not significantly anemic, her electrolytes are slightly abnormal with a low potassium and sodium, urinalysis shows no signs of urinary tract infection and frankly no ketones and has a low specific gravity.  Lipase is normal, patient is not  pregnant, vital signs show normal blood pressure, normal heart rate, fever treated with Tylenol.  She will also be given Toradol for her earache.  She is otherwise well-appearing, she will be given some medicine for nausea for home.  There is no signs of otitis externa, otitis media or sinusitis.   [BM]    Clinical Course User Index [BM] Eber HongMiller, Delissa Silba, MD    Temperature is 102.2, the patient's exam is otherwise unremarkable, will check urinalysis, lab work-up, no signs of infection clinically including in the back of her throat or her ear, she has no pulmonary complaints and no urinary complaints and no gastrointestinal complaints other than nausea.  Mental status is normal, no stiffness of the neck  I informed the patient of all of her results at the bedside, understanding expressed to the indications for return.  Final Clinical Impressions(s) / ED Diagnoses   Final diagnoses:  Leukopenia, unspecified type  Fever, unspecified fever cause  Left ear pain  Nausea    ED Discharge Orders         Ordered    meloxicam (MOBIC) 7.5 MG tablet  Daily     07/14/18 1022    ondansetron (ZOFRAN ODT) 4 MG disintegrating tablet  Every 8 hours PRN     07/14/18 1022           Eber HongMiller, Dejai Schubach, MD 07/14/18 1023

## 2018-07-14 NOTE — Discharge Instructions (Signed)
Please follow-up with your family doctor within the next week for a recheck however if you should develop severe or worsening symptoms including headache, stiff neck, high fevers you should return to the emergency department.  Your testing today shows that your white blood cell count was lower than usual but just barely.  This needs to be followed up within the next week.  Your potassium level was also slightly low and you should eat increased amounts of bananas or cooked black beans which are high in potassium.  Have your electrolytes rechecked within the next week to make sure they are normalizing.  Tylenol or Mobic for pain and fever.

## 2018-07-14 NOTE — ED Notes (Signed)
Pt stable, ambulatory, and verbalizes understanding of d/c instructions.  

## 2018-07-15 LAB — URINE CULTURE

## 2018-07-16 ENCOUNTER — Inpatient Hospital Stay (HOSPITAL_COMMUNITY): Payer: Medicaid Other

## 2018-07-16 ENCOUNTER — Emergency Department (HOSPITAL_COMMUNITY): Payer: Medicaid Other

## 2018-07-16 ENCOUNTER — Inpatient Hospital Stay (HOSPITAL_COMMUNITY)
Admission: EM | Admit: 2018-07-16 | Discharge: 2018-07-26 | DRG: 502 | Disposition: A | Discharge status: Home / Self Care | Payer: Medicaid Other | Attending: Family Medicine | Admitting: Family Medicine

## 2018-07-16 ENCOUNTER — Encounter (HOSPITAL_COMMUNITY): Payer: Self-pay | Admitting: Emergency Medicine

## 2018-07-16 ENCOUNTER — Other Ambulatory Visit: Payer: Self-pay

## 2018-07-16 DIAGNOSIS — Z8249 Family history of ischemic heart disease and other diseases of the circulatory system: Secondary | ICD-10-CM | POA: Diagnosis not present

## 2018-07-16 DIAGNOSIS — E876 Hypokalemia: Secondary | ICD-10-CM | POA: Diagnosis present

## 2018-07-16 DIAGNOSIS — M6282 Rhabdomyolysis: Secondary | ICD-10-CM | POA: Diagnosis not present

## 2018-07-16 DIAGNOSIS — Z791 Long term (current) use of non-steroidal anti-inflammatories (NSAID): Secondary | ICD-10-CM

## 2018-07-16 DIAGNOSIS — R509 Fever, unspecified: Secondary | ICD-10-CM | POA: Diagnosis not present

## 2018-07-16 DIAGNOSIS — D696 Thrombocytopenia, unspecified: Secondary | ICD-10-CM | POA: Diagnosis present

## 2018-07-16 DIAGNOSIS — R740 Nonspecific elevation of levels of transaminase and lactic acid dehydrogenase [LDH]: Secondary | ICD-10-CM | POA: Diagnosis present

## 2018-07-16 DIAGNOSIS — R61 Generalized hyperhidrosis: Secondary | ICD-10-CM | POA: Diagnosis present

## 2018-07-16 DIAGNOSIS — I10 Essential (primary) hypertension: Secondary | ICD-10-CM | POA: Diagnosis present

## 2018-07-16 DIAGNOSIS — R109 Unspecified abdominal pain: Secondary | ICD-10-CM | POA: Diagnosis not present

## 2018-07-16 DIAGNOSIS — D649 Anemia, unspecified: Secondary | ICD-10-CM | POA: Diagnosis not present

## 2018-07-16 DIAGNOSIS — F17210 Nicotine dependence, cigarettes, uncomplicated: Secondary | ICD-10-CM | POA: Diagnosis present

## 2018-07-16 DIAGNOSIS — D709 Neutropenia, unspecified: Secondary | ICD-10-CM | POA: Diagnosis present

## 2018-07-16 HISTORY — DX: Rhabdomyolysis: M62.82

## 2018-07-16 LAB — CBC WITH DIFFERENTIAL/PLATELET
ABS IMMATURE GRANULOCYTES: 0 10*3/uL (ref 0.0–0.1)
BASOS ABS: 0 10*3/uL (ref 0.0–0.1)
Basophils Relative: 0 %
Eosinophils Absolute: 0 10*3/uL (ref 0.0–0.7)
Eosinophils Relative: 0 %
HCT: 39 % (ref 36.0–46.0)
HEMOGLOBIN: 13.2 g/dL (ref 12.0–15.0)
Immature Granulocytes: 0 %
LYMPHS PCT: 41 %
Lymphs Abs: 1 10*3/uL (ref 0.7–4.0)
MCH: 31 pg (ref 26.0–34.0)
MCHC: 33.8 g/dL (ref 30.0–36.0)
MCV: 91.5 fL (ref 78.0–100.0)
Monocytes Absolute: 0.1 10*3/uL (ref 0.1–1.0)
Monocytes Relative: 5 %
NEUTROS ABS: 1.3 10*3/uL — AB (ref 1.7–7.7)
Neutrophils Relative %: 54 %
PLATELETS: 153 10*3/uL (ref 150–400)
RBC: 4.26 MIL/uL (ref 3.87–5.11)
RDW: 12.6 % (ref 11.5–15.5)
WBC: 2.4 10*3/uL — AB (ref 4.0–10.5)

## 2018-07-16 LAB — URINALYSIS, ROUTINE W REFLEX MICROSCOPIC
Bilirubin Urine: NEGATIVE
GLUCOSE, UA: NEGATIVE mg/dL
Ketones, ur: NEGATIVE mg/dL
Leukocytes, UA: NEGATIVE
NITRITE: NEGATIVE
PROTEIN: 100 mg/dL — AB
Specific Gravity, Urine: 1.01 (ref 1.005–1.030)
pH: 6 (ref 5.0–8.0)

## 2018-07-16 LAB — COMPREHENSIVE METABOLIC PANEL
ALT: 47 U/L — ABNORMAL HIGH (ref 0–44)
AST: 212 U/L — ABNORMAL HIGH (ref 15–41)
Albumin: 3.6 g/dL (ref 3.5–5.0)
Alkaline Phosphatase: 48 U/L (ref 38–126)
Anion gap: 11 (ref 5–15)
BILIRUBIN TOTAL: 0.5 mg/dL (ref 0.3–1.2)
BUN: <5 mg/dL — ABNORMAL LOW (ref 6–20)
CO2: 25 mmol/L (ref 22–32)
Calcium: 9.2 mg/dL (ref 8.9–10.3)
Chloride: 101 mmol/L (ref 98–111)
Creatinine, Ser: 0.86 mg/dL (ref 0.44–1.00)
Glucose, Bld: 111 mg/dL — ABNORMAL HIGH (ref 70–99)
POTASSIUM: 2.9 mmol/L — AB (ref 3.5–5.1)
Sodium: 137 mmol/L (ref 135–145)
TOTAL PROTEIN: 7.6 g/dL (ref 6.5–8.1)

## 2018-07-16 LAB — I-STAT BETA HCG BLOOD, ED (MC, WL, AP ONLY): I-stat hCG, quantitative: <5 m[IU]/mL (ref <5)

## 2018-07-16 LAB — CK

## 2018-07-16 LAB — LIPASE, BLOOD: Lipase: 54 U/L — ABNORMAL HIGH (ref 11–51)

## 2018-07-16 LAB — I-STAT CG4 LACTIC ACID, ED: Lactic Acid, Venous: 1.11 mmol/L (ref 0.5–1.9)

## 2018-07-16 MED ORDER — SODIUM CHLORIDE 0.9 % IV BOLUS
1000.0000 mL | Freq: Once | INTRAVENOUS | Status: AC
  Administered 2018-07-16: 1000 mL via INTRAVENOUS

## 2018-07-16 MED ORDER — KETOROLAC TROMETHAMINE 15 MG/ML IJ SOLN
15.0000 mg | Freq: Four times a day (QID) | INTRAMUSCULAR | Status: DC | PRN
Start: 2018-07-16 — End: 2018-07-17
  Administered 2018-07-16 – 2018-07-17 (×2): 15 mg via INTRAVENOUS
  Filled 2018-07-16 (×2): qty 1

## 2018-07-16 MED ORDER — ENOXAPARIN SODIUM 40 MG/0.4ML ~~LOC~~ SOLN
40.0000 mg | SUBCUTANEOUS | Status: DC
  Administered 2018-07-16: 40 mg via SUBCUTANEOUS
  Filled 2018-07-16: qty 0.4

## 2018-07-16 MED ORDER — SODIUM CHLORIDE 0.9 % IV BOLUS
500.0000 mL | Freq: Once | INTRAVENOUS | Status: AC
  Administered 2018-07-16: 500 mL via INTRAVENOUS

## 2018-07-16 MED ORDER — ONDANSETRON HCL 4 MG/2ML IJ SOLN
4.0000 mg | Freq: Once | INTRAMUSCULAR | Status: AC
  Administered 2018-07-16: 4 mg via INTRAVENOUS
  Filled 2018-07-16: qty 2

## 2018-07-16 MED ORDER — ONDANSETRON 4 MG PO TBDP
4.0000 mg | ORAL_TABLET | Freq: Three times a day (TID) | ORAL | Status: DC | PRN
  Administered 2018-07-17 – 2018-07-18 (×2): 4 mg via ORAL
  Filled 2018-07-16 (×2): qty 1

## 2018-07-16 MED ORDER — SODIUM CHLORIDE 0.9 % IV SOLN
INTRAVENOUS | Status: DC
  Administered 2018-07-16: 16:00:00 via INTRAVENOUS

## 2018-07-16 MED ORDER — IOHEXOL 300 MG/ML  SOLN
100.0000 mL | Freq: Once | INTRAMUSCULAR | Status: AC | PRN
  Administered 2018-07-16: 100 mL via INTRAVENOUS

## 2018-07-16 MED ORDER — SODIUM CHLORIDE 0.9 % IV SOLN
INTRAVENOUS | Status: DC
  Administered 2018-07-16 – 2018-07-17 (×2): via INTRAVENOUS

## 2018-07-16 MED ORDER — ZOLPIDEM TARTRATE 5 MG PO TABS
5.0000 mg | ORAL_TABLET | Freq: Once | ORAL | Status: AC
  Administered 2018-07-16: 5 mg via ORAL
  Filled 2018-07-16: qty 1

## 2018-07-16 NOTE — ED Notes (Signed)
Please note: in addition to UA, a urine culture was collected and sent down to the Main Lab as well.

## 2018-07-16 NOTE — ED Notes (Addendum)
UA collected and urine noted to be dark and tea-like in color. Pt also states she is now experiencing diarrhea. RN notified.

## 2018-07-16 NOTE — ED Notes (Signed)
Pt informed she needs to provide a UA when possible; Pt verbalized understanding.   

## 2018-07-16 NOTE — H&P (Signed)
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 and ate a [place]key sandwich the morning of when she got sick Progressed to have anorexia only able to keep down crackers and applesauce and also high fevers with chills she still went to work on Sunday Every time she ate something heavier than crackers she would vomit Never has had similar symptoms-no exotic travel-no drug use or illicit use-no anorexiants or over-the-counter meds-no sick contacts Was sent home with Mobic and Zofran  Turned on 8/14 with worsening symptoms high fever dark-colored urine no dysuria and now diarrhea X 2 days watery in consistency, no blurred vision no double vision  Characterizes abdominal pain is coming and going worsened by eating and pressure over it no other relieving factors-states it feels "woozy"-like butterflies Has a mild headache right hemicranium and does feel lightheaded when getting up  ED Course: Work-up performed CK 50,000-given saline and 125 cc/h fluids  Review of Systems:  Negative except as above Negative for fever, visual changes, sore throat, rash, new muscle aches, chest pain, SOB, dysuria, bleeding, n/v/abdominal pain.  Past Medical History:  Diagnosis Date  . Hypertension   . Vertigo     Past Surgical History:  Procedure Laterality Date  . CESAREAN SECTION       reports that she has been smoking cigarettes. She has a 0.50 pack-year smoking history. She has never used smokeless tobacco. She reports that she drinks alcohol. She reports that she does not use drugs. 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 No pertinent family history. Mother Father alive and well both have history of hypertension  Prior to Admission medications   Medication Sig Start Date End Date Taking? Authorizing Provider  acetaminophen (TYLENOL) 500 MG tablet Take 1,000 mg by mouth every 6 (six) hours as needed for mild pain.   Yes [provider]  cyclobenzaprine (FLEXERIL) 10 MG tablet Take 1 tablet (10 mg total) by mouth 2 (two) times daily as needed for muscle spasms. 07/14/17  Yes Jacalyn LefevreHaviland, Julie, MD  meclizine (ANTIVERT) 25 MG tablet Take 1 tablet (25 mg total) by mouth 3 (three) times daily as needed for dizziness. 05/20/17  Yes McDonald, Mia A, PA-C  meloxicam (MOBIC) 7.5 MG tablet Take 2 tablets (15 mg total) by mouth daily. 07/14/18  Yes Eber HongMiller, Brian, MD  ondansetron (ZOFRAN ODT) 4 MG disintegrating tablet Take 1 tablet (4 mg total) by mouth every 8 (eight) hours as needed for nausea. 07/14/18  Yes Eber HongMiller, Brian, MD  HYDROcodone-acetaminophen (NORCO/VICODIN) 5-325 MG tablet Take 1 tablet by mouth every 4 (four) hours as needed. Patient not taking: Reported on 07/16/2018 07/14/17   Jacalyn LefevreHaviland, Julie, MD  ibuprofen (ADVIL,MOTRIN) 600 MG tablet Take 1 tablet (600 mg total) by mouth every 6 (six) hours as needed. Patient not taking: Reported on 07/16/2018 07/14/17   Jacalyn LefevreHaviland, Julie, MD  ondansetron (ZOFRAN) 4 MG tablet Take 1 tablet (4 mg total) by mouth every 6 (six) hours. Patient not taking: Reported on 07/16/2018 05/20/17   Barkley BoardsMcDonald, Mia A, PA-C  Physical Exam:   Awake alert pleasant slightly ill-appearing dry mucosa no icterus mild strabismus  Throat is dry no tonsillar hypertrophy or exudate ENT exam reveals fluid behind both ears but no air-fluid level insufflation deferred neck is soft supple no thyromegaly no submandibular lymphadenopathy  Chest is clear without rales rhonchi  Abdomen  is tender in epigastrium no CVA tenderness no rebound abdomen is somewhat obese  No lower extremity edema  Neurologically intact appropriate moves all 4 limbs 5/5 power reflexes deferred EOMI NCAT  Psych euthymic appropriate  I have personally reviewed following labs and imaging studies  Labs:   Urine color is amber-protein 100, hemoglobin large many bacteria  CK >50,000, lipase 54 lactic acid 1.1, potassium 2.9 BUN/creatinine 5/0.8  AST 212 ALT 47 up from baseline 31/13  Imaging studies:   Chest x-ray negative  Medical tests:   EKG independently reviewed: Sinus rhythm  Test discussed with performing physician:  Yes discussed with ED physician  Decision to obtain old records:   Yes  Review and summation of old records:   Summarized  Active Problems:   Rhabdomyolysis   Assessment/Plan Sirs versus sepsis-temperature as high as 104-obtain blood cultures-would hold antibiotics at this time until CT scan abdomen pelvis done suspect may have either a viral pancreatitis or gallstone pancreatitis If patient spikes another fever will probably need to start intra-abdominal coverage with Zosyn monotherapy  Nontraumatic nonexertional rhabdomyolysis-CK above 50,000 we will repeat CK every 12 hydrate copiously 125 cc/h would hold bicarb/Lasix at this time and monitor labs alone-a.m. labs will get mag and phos  Hypokalemia-likely secondary to profuse sweating and also rhabdo  Reported history HTN       Severity of Illness: The appropriate patient status for this patient is INPATIENT. Inpatient status is judged to be reasonable and necessary in order to provide the required intensity of service to ensure the patient's safety. The patient's presenting symptoms, physical exam findings, and initial radiographic and laboratory data in the context of their chronic comorbidities is felt to place them at high risk for further clinical deterioration. Furthermore, it is not  anticipated that the patient will be medically stable for discharge from the hospital within 2 midnights of admission. The following factors support the patient status of inpatient.   " The patient's presenting symptoms include sirs. " The worrisome physical exam findings include abd pain. " The initial radiographic and laboratory data are worrisome because of lipase/low wbc/Elevated lft. " The chronic co-morbidities include none.   * I certify that at the point of admission it is my clinical judgment that the patient will require inpatient hospital care spanning beyond 2 midnights from the point of admission due to high intensity of service, high risk for further deterioration and high frequency of surveillance required.*     DVT prophylaxis:lovenoepx Code Status: full Family Communication: no Consults called: no    Time spent: 45 minutes  Pleas KochJai Adelis Docter, MD [facility] 5:19 PM  07/16/2018, 5:01 PM

## 2018-07-16 NOTE — ED Notes (Signed)
Patient transported to CT 

## 2018-07-16 NOTE — ED Triage Notes (Signed)
Pt to for continued symptoms - was seen 2 days ago for same. Fever of 103 this morning, now reporting left frontal headache and photosensitivity, pt diaphoretic at this time, states took tylenol this morning for measured fever. Reports pain to left ear. Reports feeling "stiff all over." No neck stiffness noted.

## 2018-07-16 NOTE — ED Provider Notes (Signed)
[facility]   CSN: [id number] Arrival date & time: 07/16/18  0915     History   Chief Complaint Chief Complaint  Patient presents with  . Weakness    HPI Kayla Adkins is a 37 y.o. female.  Patient returns today and still not feeling well.  She was seen on August 12 in the emergency department.  For similar symptoms.  Today she said she had a temp to 103 this morning.  Reporting a mild left frontal headache and some mild sensitivity to light no neck pain no severe headache no back pain.  Patient has had feeling of being stiff all over.  But no neck stiffness.  Patient states she has had some decreased appetite has had some and some nausea.  When patient was seen on August 12 she was sent home with some antinausea medicine.  On that day she was describing generalized body aches for 3 days.  Patient had told me that symptoms started just the day prior to being seen on the 12th which may be confused.  Patient also had some left ear pain.  She had a temp to 102 on that day.  Her lab work-up had no significant abnormalities.  Patient denies any overheated exposure since is been hot lately.  No unusual travel.  No prior similar symptoms.  Patient denies any upper respiratory symptoms.  Also denies any recent trauma.     Past Medical History:  Diagnosis Date  . Hypertension   . Vertigo     Patient Active Problem List   Diagnosis Date Noted  . Rhabdomyolysis 07/16/2018    Past Surgical History:  Procedure Laterality Date  . CESAREAN SECTION       OB History   None      Home Medications    Prior to Admission medications   Medication Sig Start Date End Date Taking? Authorizing Provider  acetaminophen (TYLENOL) 500 MG tablet Take 1,000 mg by mouth every 6 (six) hours as needed for mild pain.   Yes [provider]  cyclobenzaprine (FLEXERIL) 10 MG tablet Take 1 tablet (10 mg total) by mouth 2 (two) times daily as  needed for muscle spasms. 07/14/17  Yes Jacalyn LefevreHaviland, Julie, MD  meclizine (ANTIVERT) 25 MG tablet Take 1 tablet (25 mg total) by mouth 3 (three) times daily as needed for dizziness. 05/20/17  Yes McDonald, Mia A, PA-C  meloxicam (MOBIC) 7.5 MG tablet Take 2 tablets (15 mg total) by mouth daily. 07/14/18  Yes Eber HongMiller, Brian, MD  ondansetron (ZOFRAN ODT) 4 MG disintegrating tablet Take 1 tablet (4 mg total) by mouth every 8 (eight) hours as needed for nausea. 07/14/18  Yes Eber HongMiller, Brian, MD  HYDROcodone-acetaminophen (NORCO/VICODIN) 5-325 MG tablet Take 1 tablet by mouth every 4 (four) hours as needed. Patient not taking: Reported on 07/16/2018 07/14/17   Jacalyn LefevreHaviland, Julie, MD  ibuprofen (ADVIL,MOTRIN) 600 MG tablet Take 1 tablet (600 mg total) by mouth every 6 (six) hours as needed. Patient not taking: Reported on 07/16/2018 07/14/17   Jacalyn LefevreHaviland, Julie, MD  ondansetron (ZOFRAN) 4 MG tablet Take 1 tablet (4 mg total) by mouth every 6 (six) hours. Patient not taking: Reported on 07/16/2018 05/20/17   Barkley BoardsMcDonald, Mia A, PA-C    Family History History reviewed. No pertinent family history.  Social History Social History   Tobacco Use  . Smoking status: Current Some Day Smoker    Packs/day: 0.25    Years: 2.00    Pack  years: 0.50    Types: Cigarettes  . Smokeless tobacco: Never Used  Substance Use Topics  . Alcohol use: Yes    Comment: sometimes  . Drug use: No     Allergies   Patient has no known allergies.   Review of Systems Review of Systems  Constitutional: Positive for diaphoresis and fever. Negative for appetite change.  HENT: Negative for congestion.   Eyes: Positive for photophobia. Negative for visual disturbance.  Cardiovascular: Negative for chest pain.  Gastrointestinal: Positive for nausea. Negative for abdominal pain, constipation and vomiting.  Genitourinary: Negative for dysuria.  Musculoskeletal: Positive for myalgias. Negative for back pain, neck pain and neck stiffness.  Skin:  Negative for rash.  Neurological: Positive for headaches.  Hematological: Does not bruise/bleed easily.  Psychiatric/Behavioral: Negative for confusion.     Physical Exam Updated Vital Signs BP (!) 159/78   Pulse 92   Temp 98.6 F (37 C) (Oral)   Resp 15   Ht 1.676 m (5\' 6" )   Wt 64.4 kg   LMP 06/24/2018 (Approximate)   SpO2 100%   BMI 22.92 kg/m   Physical Exam  Constitutional: She is oriented to person, place, and time. She appears well-developed and well-nourished. No distress.  HENT:  Head: Normocephalic and atraumatic.  Mucous membranes slightly dry.  Eyes: Pupils are equal, round, and reactive to light. Conjunctivae and EOM are normal.  Neck: Normal range of motion. Neck supple.  Cardiovascular: Normal rate, regular rhythm and normal heart sounds.  Pulmonary/Chest: Effort normal and breath sounds normal. No respiratory distress.  Abdominal: Soft. Bowel sounds are normal. There is no tenderness.  Musculoskeletal: Normal range of motion.  Neurological: She is alert and oriented to person, place, and time. No cranial nerve deficit or sensory deficit. She exhibits normal muscle tone. Coordination normal.  Skin: Skin is warm. Capillary refill takes less than 2 seconds.  Nursing note and vitals reviewed.    ED Treatments / Results  Labs (all labs ordered are listed, but only abnormal results are displayed) Labs Reviewed  COMPREHENSIVE METABOLIC PANEL - Abnormal; Notable for the following components:      Result Value   Potassium 2.9 (*)    Glucose, Bld 111 (*)    BUN <5 (*)    AST 212 (*)    ALT 47 (*)    All other components within normal limits  CBC WITH DIFFERENTIAL/PLATELET - Abnormal; Notable for the following components:   WBC 2.4 (*)    Neutro Abs 1.3 (*)    All other components within normal limits  URINALYSIS, ROUTINE W REFLEX MICROSCOPIC - Abnormal; Notable for the following components:   Color, Urine AMBER (*)    Hgb urine dipstick LARGE (*)     Protein, ur 100 (*)    Bacteria, UA MANY (*)    All other components within normal limits  LIPASE, BLOOD - Abnormal; Notable for the following components:   Lipase 54 (*)    All other components within normal limits  CK - Abnormal; Notable for the following components:   Total CK >50,000 (*)    All other components within normal limits  I-STAT CG4 LACTIC ACID, ED  I-STAT BETA HCG BLOOD, ED ([facility], [facility], [facility] ONLY)    EKG EKG Interpretation  Date/Time:  Wednesday July 16 2018 09:49:03 EDT Ventricular Rate:  77 PR Interval:    QRS Duration: 90 QT Interval:  389 QTC Calculation: 441 R Axis:   23 Text Interpretation:  Sinus rhythm Confirmed  by Vanetta Mulders [phone]) on 07/16/2018 10:25:21 AM   Radiology Dg Chest 2 View  Result Date: 07/16/2018 CLINICAL DATA:  Persistent fever. EXAM: CHEST - 2 VIEW COMPARISON:  05/23/2015 FINDINGS: The heart size and mediastinal contours are within normal limits. Both lungs are clear. The visualized skeletal structures are unremarkable. IMPRESSION: Normal exam. Electronically Signed   By: Francene Boyers M.D.   On: 07/16/2018 11:57    Procedures Procedures (including critical care time)  Medications Ordered in ED Medications  0.9 %  sodium chloride infusion ( Intravenous New Bag/Given 07/16/18 1628)  sodium chloride 0.9 % bolus 1,000 mL (1,000 mLs Intravenous New Bag/Given 07/16/18 1627)  sodium chloride 0.9 % bolus 1,000 mL (0 mLs Intravenous Stopped 07/16/18 1620)  ondansetron (ZOFRAN) injection 4 mg (4 mg Intravenous Given 07/16/18 1204)     Initial Impression / Assessment and Plan / ED Course  I have reviewed the triage vital signs and the nursing notes.  Pertinent labs & imaging results that were available during my care of the patient were reviewed by me and considered in my medical decision making (see chart for details).     Patient's work-up here very significant marked elevation in CK greater than 50,000.  Patient had a large amount  hemoglobin urine this probably represents rhabdomyolysis.  Probably triggered by the fevers and perhaps a viral illness.  Does have a little bit of a leukopenia.  Chest x-ray was negative here today urine negative for urinary tract infection labs otherwise had some mild changes compared to August 12.  No significance renal function normal.  Discussed with the hospitalist team and they will admit.  Patient before the CK had come back and received 1 L of fluid.  She will receive a second liter of fluid.  Patient although had mild headache and some mild photophobia clinically does not have meningitis.  Patient despite the fevers is nontoxic no acute distress.  No significant abdominal tenderness.  Final Clinical Impressions(s) / ED Diagnoses   Final diagnoses:  Non-traumatic rhabdomyolysis  Neutropenia, unspecified type [facility])    ED Discharge Orders    None       Vanetta Mulders, MD 07/16/18 1709

## 2018-07-17 DIAGNOSIS — D709 Neutropenia, unspecified: Secondary | ICD-10-CM

## 2018-07-17 LAB — CBC WITH DIFFERENTIAL/PLATELET
Abs Immature Granulocytes: 0 10*3/uL (ref 0.0–0.1)
BASOS ABS: 0 10*3/uL (ref 0.0–0.1)
Basophils Relative: 0 %
EOS ABS: 0 10*3/uL (ref 0.0–0.7)
EOS PCT: 0 %
HCT: 35.2 % — ABNORMAL LOW (ref 36.0–46.0)
Hemoglobin: 12 g/dL (ref 12.0–15.0)
Immature Granulocytes: 0 %
Lymphocytes Relative: 48 %
Lymphs Abs: 1.2 10*3/uL (ref 0.7–4.0)
MCH: 31.3 pg (ref 26.0–34.0)
MCHC: 34.1 g/dL (ref 30.0–36.0)
MCV: 91.9 fL (ref 78.0–100.0)
Monocytes Absolute: 0.1 10*3/uL (ref 0.1–1.0)
Monocytes Relative: 5 %
Neutro Abs: 1.2 10*3/uL — ABNORMAL LOW (ref 1.7–7.7)
Neutrophils Relative %: 47 %
Platelets: 130 10*3/uL — ABNORMAL LOW (ref 150–400)
RBC: 3.83 MIL/uL — AB (ref 3.87–5.11)
RDW: 12.8 % (ref 11.5–15.5)
WBC: 2.6 10*3/uL — AB (ref 4.0–10.5)

## 2018-07-17 LAB — HIV ANTIBODY (ROUTINE TESTING W REFLEX): HIV Screen 4th Generation wRfx: NONREACTIVE

## 2018-07-17 LAB — MAGNESIUM: Magnesium: 2 mg/dL (ref 1.7–2.4)

## 2018-07-17 LAB — COMPREHENSIVE METABOLIC PANEL
ALK PHOS: 39 U/L (ref 38–126)
ALT: 117 U/L — AB (ref 0–44)
AST: 593 U/L — ABNORMAL HIGH (ref 15–41)
Albumin: 3 g/dL — ABNORMAL LOW (ref 3.5–5.0)
Anion gap: 11 (ref 5–15)
BILIRUBIN TOTAL: 0.4 mg/dL (ref 0.3–1.2)
BUN: <5 mg/dL — ABNORMAL LOW (ref 6–20)
CALCIUM: 7.8 mg/dL — AB (ref 8.9–10.3)
CHLORIDE: 104 mmol/L (ref 98–111)
CO2: 24 mmol/L (ref 22–32)
CREATININE: 0.7 mg/dL (ref 0.44–1.00)
Glucose, Bld: 122 mg/dL — ABNORMAL HIGH (ref 70–99)
Potassium: 2.9 mmol/L — ABNORMAL LOW (ref 3.5–5.1)
Sodium: 139 mmol/L (ref 135–145)
TOTAL PROTEIN: 6 g/dL — AB (ref 6.5–8.1)

## 2018-07-17 LAB — PHOSPHORUS: PHOSPHORUS: 2.5 mg/dL (ref 2.5–4.6)

## 2018-07-17 LAB — CK: Total CK: >50000 U/L — ABNORMAL HIGH (ref 38–234)

## 2018-07-17 MED ORDER — SODIUM CHLORIDE 0.9 % IV BOLUS
500.0000 mL | Freq: Once | INTRAVENOUS | Status: AC
  Administered 2018-07-17: 500 mL via INTRAVENOUS

## 2018-07-17 MED ORDER — POTASSIUM CHLORIDE IN NACL 40-0.9 MEQ/L-% IV SOLN
INTRAVENOUS | Status: DC
  Administered 2018-07-17 (×3): 125 mL/h via INTRAVENOUS
  Filled 2018-07-17 (×3): qty 1000

## 2018-07-17 MED ORDER — METOPROLOL TARTRATE 12.5 MG HALF TABLET
12.5000 mg | ORAL_TABLET | Freq: Two times a day (BID) | ORAL | Status: DC
  Administered 2018-07-17 – 2018-07-21 (×9): 12.5 mg via ORAL
  Filled 2018-07-17 (×10): qty 1

## 2018-07-17 MED ORDER — IBUPROFEN 400 MG PO TABS
400.0000 mg | ORAL_TABLET | Freq: Once | ORAL | Status: AC
  Administered 2018-07-17: 400 mg via ORAL
  Filled 2018-07-17: qty 1

## 2018-07-17 MED ORDER — SODIUM CHLORIDE 0.9 % IV SOLN: INTRAVENOUS | Status: DC

## 2018-07-17 MED ORDER — IBUPROFEN 400 MG PO TABS
400.0000 mg | ORAL_TABLET | ORAL | Status: DC | PRN
  Administered 2018-07-17 – 2018-07-22 (×7): 400 mg via ORAL
  Filled 2018-07-17 (×7): qty 1

## 2018-07-17 MED ORDER — PIPERACILLIN-TAZOBACTAM 3.375 G IVPB
3.3750 g | Freq: Three times a day (TID) | INTRAVENOUS | Status: DC
  Administered 2018-07-17 – 2018-07-19 (×7): 3.375 g via INTRAVENOUS
  Filled 2018-07-17 (×7): qty 50

## 2018-07-17 NOTE — Progress Notes (Signed)
[facility] PROGRESS NOTE  Kirtland BouchardVaronica Kleman NWG:[id number] DOB: [birth year] DOA: 07/16/2018 PCP: Patient, No Pcp Per   Narrative: 37 year old female with essentially no medical illnesses admitted by myself on 8/14 with persisting fever nausea vomiting after having been treated in the ED on 8/12 for viral illness And found to have slightly elevated LFTs pressed white count, rhabdomyolysis above 50,000 uncomfortable abdomen   A & Plan Fever query cause-DDX rhabdomyolysis versus viral illness-discussed with Dr. Ninetta LightsHatcher on 8/15 obtaining coxsackie AMB, parvovirus, Epstein-Barr and CMV-blood cultures performed on 8:14 PM given spiking fevers-Zosyn added on the morning of 8/15 and will continue--note that CT abdomen pelvis was completely negative  Rhabdomyolysis-CK still above 50,000 she feels 20% better in terms of pain-monitor.  If no resolution will need to speak to nephrology-currently kidney function phosphorus normal  Severe hypokalemia likely secondary to sweating and rhabdo-replacing with IV saline keep rate the same but add potassium for limited duration of time and repeat labs in a.m.  Thrombocytopenia is relative but reactive probably we will monitor closely      DVT prophylaxis:lovenoex code Status: Full family Communication:    Disposition Plan: Inpatient   Mohanad Carsten, MD  [facility] Direct contact: [phone] --Via amion app OR  --[email or web address]; password [email or web address]  7PM-7AM contact night coverage as above 07/17/2018, 9:20 AM  LOS: 1 day   Consultants:  None yet telephone consulted ID  Procedures:  T abdomen pelvis  Antimicrobials:  Zosyn 8/15 a.m.  Interval history/Subjective: Awake alert so uncomfortable some nausea tolerated some lunch but did not feel too good with it Feels a little improved  Objective:  Vitals:  Vitals:   07/17/18 0429 07/17/18 0514  BP: (!) 149/97 (!) 144/88  Pulse: 81 82  Resp: 17 18  Temp: 98.6 F (37 C) 98.6 F (37 C)   SpO2: 99% 100%    Exam: Awake alert pleasant no icterus no pallor throat is clear Chest is clinically clear S1-S2 no murmur rub or gallop abdomen is soft nontender no rebound no hepatosplenomegaly No lower extremity edema no rash  I have personally reviewed the following:   Labs:  White count 2.6 hemoglobin 12.0 platelet 130 neutrophil numbers less than 1.2  /ALT 212/47-->593/1 1 7   Potassium 2.9 calcium 7.8 CK total still above 50,000  Imaging studies:  CT abdomen pelvis as above  Medical tests:  No  Test discussed with performing physician:  No  Decision to obtain old records:  Yes  Review and summation of old records:  Yes  Scheduled Meds: Continuous Infusions: . 0.9 % NaCl with KCl 40 mEq / L 125 mL/hr (07/17/18 0757)  . piperacillin-tazobactam (ZOSYN)  IV 3.375 g (07/17/18 0710)    Active Problems:   Rhabdomyolysis   LOS: 1 day

## 2018-07-17 NOTE — Progress Notes (Addendum)
2255 paged K. Schorr with notification pt been running 100.3-100.6 fevers since 1800.  Fluid bolus order and given.  2330 vitals rechecked, temp and BP increased (101.6, 156/98), K. Schorr updated.  Order for NS bolus and Ibuprofen.   0245 temp 99.7

## 2018-07-17 NOTE — Progress Notes (Signed)
Pt is resting comfortably in bed, eyes are closed, no s/s of distress or discomfort.  Respirations 14.

## 2018-07-17 NOTE — Plan of Care (Signed)
  Problem: Clinical Measurements: Goal: Diagnostic test results will improve Outcome: Progressing Goal: Cardiovascular complication will be avoided Outcome: Progressing   Problem: Pain Managment: Goal: General experience of comfort will improve Outcome: Progressing   

## 2018-07-18 LAB — CBC WITH DIFFERENTIAL/PLATELET
BASOS ABS: 0 10*3/uL (ref 0.0–0.1)
BASOS PCT: 0 %
EOS ABS: 0 10*3/uL (ref 0.0–0.7)
Eosinophils Relative: 0 %
HEMATOCRIT: 36.9 % (ref 36.0–46.0)
Hemoglobin: 12.1 g/dL (ref 12.0–15.0)
LYMPHS ABS: 1.1 10*3/uL (ref 0.7–4.0)
LYMPHS PCT: 46 %
MCH: 30.3 pg (ref 26.0–34.0)
MCHC: 32.8 g/dL (ref 30.0–36.0)
MCV: 92.3 fL (ref 78.0–100.0)
Monocytes Absolute: 0.1 10*3/uL (ref 0.1–1.0)
Monocytes Relative: 6 %
NEUTROS ABS: 1.2 10*3/uL — AB (ref 1.7–7.7)
Neutrophils Relative %: 48 %
PLATELETS: 143 10*3/uL — AB (ref 150–400)
RBC: 4 MIL/uL (ref 3.87–5.11)
RDW: 12.9 % (ref 11.5–15.5)
WBC: 2.4 10*3/uL — ABNORMAL LOW (ref 4.0–10.5)

## 2018-07-18 LAB — HEPATITIS PANEL, ACUTE
HEP A IGM: NEGATIVE
HEP B C IGM: NEGATIVE
HEP B S AG: NEGATIVE

## 2018-07-18 LAB — COMPREHENSIVE METABOLIC PANEL
ALBUMIN: 2.9 g/dL — AB (ref 3.5–5.0)
ALK PHOS: 42 U/L (ref 38–126)
ALT: 227 U/L — AB (ref 0–44)
AST: 1206 U/L — AB (ref 15–41)
Anion gap: 9 (ref 5–15)
CALCIUM: 8.4 mg/dL — AB (ref 8.9–10.3)
CO2: 24 mmol/L (ref 22–32)
CREATININE: 0.61 mg/dL (ref 0.44–1.00)
Chloride: 108 mmol/L (ref 98–111)
GFR calc Af Amer: >60 mL/min (ref >60)
GFR calc non Af Amer: >60 mL/min (ref >60)
GLUCOSE: 93 mg/dL (ref 70–99)
Potassium: 4.2 mmol/L (ref 3.5–5.1)
Sodium: 141 mmol/L (ref 135–145)
TOTAL PROTEIN: 6.2 g/dL — AB (ref 6.5–8.1)
Total Bilirubin: 0.4 mg/dL (ref 0.3–1.2)

## 2018-07-18 LAB — CK
Total CK: >50000 U/L — ABNORMAL HIGH (ref 38–234)
Total CK: >50000 U/L — ABNORMAL HIGH (ref 38–234)

## 2018-07-18 LAB — COXSACKIE A VIRUS ANTIBODIES
COXSACKIE A9 IGM: NEGATIVE {titer}
Coxsackie A16 IgM: NEGATIVE titer
Coxsackie A24 IgG: 1:1600 {titer} — ABNORMAL HIGH
Coxsackie A24 IgM: NEGATIVE titer
Coxsackie A7 IgM: NEGATIVE titer

## 2018-07-18 LAB — CMV IGM

## 2018-07-18 LAB — EPSTEIN-BARR VIRUS VCA, IGM: EBV VCA IgM: <36 U/mL (ref 0.0–35.9)

## 2018-07-18 LAB — PARVOVIRUS B19 ANTIBODY, IGG AND IGM
PAROVIRUS B19 IGM ABS: 0.2 {index} (ref 0.0–0.8)
Parovirus B19 IgG Abs: 0.6 index (ref 0.0–0.8)

## 2018-07-18 LAB — CMV ANTIBODY, IGG (EIA)

## 2018-07-18 LAB — EPSTEIN-BARR VIRUS VCA, IGG: EBV VCA IgG: >600 U/mL — ABNORMAL HIGH (ref 0.0–17.9)

## 2018-07-18 MED ORDER — SODIUM CHLORIDE 0.9 % IV SOLN
INTRAVENOUS | Status: DC
  Administered 2018-07-18 – 2018-07-20 (×4): via INTRAVENOUS

## 2018-07-18 MED ORDER — FUROSEMIDE 10 MG/ML IJ SOLN
40.0000 mg | Freq: Once | INTRAMUSCULAR | Status: AC
  Administered 2018-07-18: 40 mg via INTRAVENOUS
  Filled 2018-07-18: qty 4

## 2018-07-18 MED ORDER — ZOLPIDEM TARTRATE 5 MG PO TABS
5.0000 mg | ORAL_TABLET | Freq: Every evening | ORAL | Status: DC | PRN
  Administered 2018-07-18 – 2018-07-21 (×4): 5 mg via ORAL
  Filled 2018-07-18 (×4): qty 1

## 2018-07-18 NOTE — Progress Notes (Signed)
[facility] PROGRESS NOTE  Kayla Adkins AJG:[id number] DOB: 1981/05/08 DOA: 07/16/2018 PCP: Patient, No Pcp Per   Narrative: 37 year old female with essentially no medical illnesses admitted by myself on 8/14 with persisting fever nausea vomiting after having been treated in the ED on 8/12 for viral illness And found to have slightly elevated LFTs pressed white count, rhabdomyolysis above 50,000 uncomfortable abdomen   A & Plan Fever query cause-DDX rhabdomyolysis versus viral illness-discussed with Dr. Johnnye Adkins on 8/15 obtaining coxsackie , parvovirus, Epstein-Barr and CMV-blood cultures performed on 8/14 PM given spiking fevers are still pending-Zosyn added  8/15 and will continue--note that CT abdomen pelvis was completely negative  Rhabdomyolysis-CK still above 50,000 she feels 85% better-D/w Dr. Jimmy Adkins of nephrology-not much else would do-Rhabdo could take upto 1 week to resolve from lab parameters and for patient safety would need to stay in hopsital  Transaminitis-2/2 Rhabdo--no further work-up as bili and alk phone nl  Severe hypokalemia likely secondary to sweating and rhabdo-was in ivf--now will d/c as K normal  Thrombocytopenia is relative but reactive probably we will monitor closely      DVT prophylaxis:lovenoex code Status: Full family Communication:    Disposition Plan: Inpatient   Kayla Campus, MD  [facility] Direct contact: [phone] --Via amion app OR  --[email or web address]; password [email or web address]  7PM-7AM contact night coverage as above 07/18/2018, 5:30 PM  LOS: 2 days   Consultants:  None yet telephone consulted ID  Procedures:  T abdomen pelvis  Antimicrobials:  Zosyn 8/15 a.m.  Interval history/Subjective:  Much better Sleeping Pain improved No cp Passing some urine  Objective:  Vitals:  Vitals:   07/18/18 1540 07/18/18 1700  BP: (!) 138/94   Pulse: 93   Resp: 18   Temp: (!) 101 F (38.3 C) 99.3 F (37.4 C)  SpO2: 100%      Exam: Awake alert pleasant no icterus no pallor throat is clear Chest is clinically clear S1-S2 no murmur rub or gallop abdomen is soft nontender no rebound no hepatosplenomegaly No lower extremity edema no rash  I have personally reviewed the following:   Labs:  WBC 2.4, ANC 1200  Cosackie labs neg  parvo neg  ast 1200 alt 200 allb 2.9   CK total still above 50,000  Imaging studies:  CT abdomen pelvis as above  Medical tests:  No  Test discussed with performing physician:  No  Decision to obtain old records:  Yes  Review and summation of old records:  Yes  Scheduled Meds: . metoprolol tartrate  12.5 mg Oral BID   Continuous Infusions: . piperacillin-tazobactam (ZOSYN)  IV 12.5 mL/hr at 07/18/18 1600    Active Problems:   Rhabdomyolysis   LOS: 2 days

## 2018-07-19 LAB — COMPREHENSIVE METABOLIC PANEL
ALT: 264 U/L — AB (ref 0–44)
AST: 1192 U/L — AB (ref 15–41)
Albumin: 2.9 g/dL — ABNORMAL LOW (ref 3.5–5.0)
Alkaline Phosphatase: 39 U/L (ref 38–126)
Anion gap: 6 (ref 5–15)
BUN: 6 mg/dL (ref 6–20)
CHLORIDE: 105 mmol/L (ref 98–111)
CO2: 27 mmol/L (ref 22–32)
CREATININE: 0.65 mg/dL (ref 0.44–1.00)
Calcium: 8.3 mg/dL — ABNORMAL LOW (ref 8.9–10.3)
GFR calc Af Amer: >60 mL/min (ref >60)
GLUCOSE: 103 mg/dL — AB (ref 70–99)
Potassium: 3.9 mmol/L (ref 3.5–5.1)
Sodium: 138 mmol/L (ref 135–145)
Total Bilirubin: 0.4 mg/dL (ref 0.3–1.2)
Total Protein: 6.4 g/dL — ABNORMAL LOW (ref 6.5–8.1)

## 2018-07-19 LAB — CBC WITH DIFFERENTIAL/PLATELET
Basophils Absolute: 0 10*3/uL (ref 0.0–0.1)
Basophils Relative: 1 %
EOS PCT: 1 %
Eosinophils Absolute: 0 10*3/uL (ref 0.0–0.7)
HEMATOCRIT: 36.7 % (ref 36.0–46.0)
Hemoglobin: 12.1 g/dL (ref 12.0–15.0)
Lymphocytes Relative: 58 %
Lymphs Abs: 1.6 10*3/uL (ref 0.7–4.0)
MCH: 30.5 pg (ref 26.0–34.0)
MCHC: 33 g/dL (ref 30.0–36.0)
MCV: 92.4 fL (ref 78.0–100.0)
MONO ABS: 0.3 10*3/uL (ref 0.1–1.0)
MONOS PCT: 10 %
NEUTROS PCT: 30 %
Neutro Abs: 0.8 10*3/uL — ABNORMAL LOW (ref 1.7–7.7)
PLATELETS: 167 10*3/uL (ref 150–400)
RBC: 3.97 MIL/uL (ref 3.87–5.11)
RDW: 12.7 % (ref 11.5–15.5)
WBC: 2.7 10*3/uL — AB (ref 4.0–10.5)

## 2018-07-19 LAB — COXSACKIE B VIRUS ANTIBODIES
Coxsackie B1 Ab: 1:8 {titer} — ABNORMAL HIGH
Coxsackie B2 Ab: 1:8 {titer} — ABNORMAL HIGH
Coxsackie B3 Ab: 1:8 {titer} — ABNORMAL HIGH
Coxsackie B4 Ab: 1:8 {titer} — ABNORMAL HIGH
Coxsackie B5 Ab: 1:32 {titer} — ABNORMAL HIGH
Coxsackie B6 Ab: 1:16 {titer} — ABNORMAL HIGH

## 2018-07-19 LAB — CK

## 2018-07-19 MED ORDER — TRAMADOL HCL 50 MG PO TABS
50.0000 mg | ORAL_TABLET | Freq: Two times a day (BID) | ORAL | Status: DC | PRN
  Administered 2018-07-19 – 2018-07-25 (×4): 50 mg via ORAL
  Filled 2018-07-19 (×3): qty 1

## 2018-07-19 NOTE — Progress Notes (Signed)
[facility] PROGRESS NOTE  Kayla Adkins HUT:[id number] DOB: [birth year] DOA: 07/16/2018 PCP: Patient, No Pcp Per   Narrative: 37 year old female with essentially no medical illnesses admitted by myself on 8/14 with persisting fever nausea vomiting after having been treated in the ED on 8/12 for viral illness And found to have slightly elevated LFTs pressed white count, rhabdomyolysis above 50,000 uncomfortable abdomen   A & Plan Fever query cause-DDX rhabdomyolysis versus viral illness-discussed with Dr. Johnnye Sima on 8/15 obtaining coxsackie , parvovirus, Epstein-Barr and CMV-blood cultures performed on 8/14 PM given spiking fevers are neg Blood cult was neg-d/c zosyn cxr neg  Rhabdomyolysis-CK still above 50,000 she feels 85% better-D/w Dr. Jimmy Footman of nephrology-not much else would do-Rhabdo could take upto 1 week to resolve--getting Antiro, anti-la, esr,crp, tsh, aldolase and follow  Transaminitis-2/2 Rhabdo--no further work-up as bili and alk phone nl  Severe hypokalemia likely secondary to sweating and rhabdo-was in ivf--now will d/c as K normal  Thrombocytopenia is relative but reactive probably we will monitor closely   DVT prophylaxis: lovenoex code Status: Full  family Communication:   none Disposition Plan: Inpatient   Amirrah Quigley, MD  [facility] Direct contact: [phone] --Via amion app OR  --[email or web address]; password [email or web address]  7PM-7AM contact night coverage as above 07/19/2018, 1:56 PM  LOS: 3 days   Consultants:  None yet telephone consulted ID  Procedures:  T abdomen pelvis  Antimicrobials:  Zosyn 8/15 a.m.  Interval history/Subjective:  Overall improved No distress eatign well Walking Still weak in le's  Objective:  Vitals:  Vitals:   07/19/18 0449 07/19/18 1322  BP: (!) 131/92 (!) 151/96  Pulse: 79 74  Resp: 16 17  Temp: (!) 97.5 F (36.4 C) 98.3 F (36.8 C)  SpO2: 100% 100%    Exam: Awake alert pleasant no icterus no pallor  throat is clear Chest is clinically clear S1-S2 no murmur rub or gallop abdomen is soft nontender no rebound no hepatosplenomegaly No lower extremity edema no rash  I have personally reviewed the following:   Labs:  WBC 2.7, ANC 800  Cosackie labs neg  parvo neg  enterovir neg  ast 1192 alt 264 allb 2.9   CK total still above 50,000  Imaging studies:  CT abdomen pelvis as above  Medical tests:  No  Test discussed with performing physician:  No  Decision to obtain old records:  Yes  Review and summation of old records:  Yes  Scheduled Meds: . metoprolol tartrate  12.5 mg Oral BID   Continuous Infusions: . sodium chloride 100 mL/hr at 07/19/18 0538    Active Problems:   Rhabdomyolysis   LOS: 3 days

## 2018-07-20 LAB — COMPREHENSIVE METABOLIC PANEL
ALBUMIN: 2.8 g/dL — AB (ref 3.5–5.0)
ALT: 284 U/L — ABNORMAL HIGH (ref 0–44)
ANION GAP: 5 (ref 5–15)
AST: 1211 U/L — ABNORMAL HIGH (ref 15–41)
Alkaline Phosphatase: 38 U/L (ref 38–126)
BUN: <5 mg/dL — ABNORMAL LOW (ref 6–20)
CHLORIDE: 107 mmol/L (ref 98–111)
CO2: 27 mmol/L (ref 22–32)
Calcium: 8.3 mg/dL — ABNORMAL LOW (ref 8.9–10.3)
Creatinine, Ser: 0.55 mg/dL (ref 0.44–1.00)
GFR calc Af Amer: >60 mL/min (ref >60)
GFR calc non Af Amer: >60 mL/min (ref >60)
GLUCOSE: 97 mg/dL (ref 70–99)
POTASSIUM: 3.8 mmol/L (ref 3.5–5.1)
SODIUM: 139 mmol/L (ref 135–145)
Total Bilirubin: 0.4 mg/dL (ref 0.3–1.2)
Total Protein: 6.1 g/dL — ABNORMAL LOW (ref 6.5–8.1)

## 2018-07-20 LAB — CBC WITH DIFFERENTIAL/PLATELET
BASOS PCT: 1 %
Basophils Absolute: 0 10*3/uL (ref 0.0–0.1)
EOS ABS: 0 10*3/uL (ref 0.0–0.7)
EOS PCT: 1 %
HCT: 37.2 % (ref 36.0–46.0)
HEMOGLOBIN: 12.2 g/dL (ref 12.0–15.0)
LYMPHS PCT: 57 %
Lymphs Abs: 2.1 10*3/uL (ref 0.7–4.0)
MCH: 30.7 pg (ref 26.0–34.0)
MCHC: 32.8 g/dL (ref 30.0–36.0)
MCV: 93.5 fL (ref 78.0–100.0)
Monocytes Absolute: 0.3 10*3/uL (ref 0.1–1.0)
Monocytes Relative: 8 %
NEUTROS PCT: 33 %
Neutro Abs: 1.2 10*3/uL — ABNORMAL LOW (ref 1.7–7.7)
Platelets: 187 10*3/uL (ref 150–400)
RBC: 3.98 MIL/uL (ref 3.87–5.11)
RDW: 13 % (ref 11.5–15.5)
WBC: 3.6 10*3/uL — ABNORMAL LOW (ref 4.0–10.5)

## 2018-07-20 LAB — SEDIMENTATION RATE: Sed Rate: 43 mm/hr — ABNORMAL HIGH (ref 0–22)

## 2018-07-20 LAB — CK: Total CK: >50000 U/L — ABNORMAL HIGH (ref 38–234)

## 2018-07-20 LAB — C-REACTIVE PROTEIN: CRP: 2.1 mg/dL — AB (ref <1.0)

## 2018-07-20 MED ORDER — SODIUM CHLORIDE 0.9 % IV SOLN: INTRAVENOUS | Status: DC

## 2018-07-20 MED ORDER — METHYLPREDNISOLONE SODIUM SUCC 40 MG IJ SOLR
40.0000 mg | Freq: Four times a day (QID) | INTRAMUSCULAR | Status: DC
  Administered 2018-07-20 – 2018-07-21 (×4): 40 mg via INTRAVENOUS
  Filled 2018-07-20 (×4): qty 1

## 2018-07-20 MED ORDER — SODIUM BICARBONATE 8.4 % IV SOLN
INTRAVENOUS | Status: DC
  Administered 2018-07-20 – 2018-07-24 (×9): via INTRAVENOUS
  Filled 2018-07-20 (×14): qty 1000

## 2018-07-20 NOTE — Progress Notes (Signed)
[facility] PROGRESS NOTE  Kayla Adkins VWU:[id number] DOB: [birth year] DOA: 07/16/2018 PCP: Kayla Adkins   Narrative: 37 year old female with essentially no medical illnesses admitted by myself on 8/14 with persisting fever nausea vomiting after having been treated in the ED on 8/12 for viral illness And found to have slightly elevated LFTs pressed white count, rhabdomyolysis above 50,000 uncomfortable abdomen   A & Plan Fever query cause-DDX rhabdomyolysis versus viral illness-discussed with Dr. Johnnye Sima on 8/15 obtaining coxsackie , parvovirus, Epstein-Barr and CMV-blood cultures performed on 8/14 PM given spiking fevers are neg Blood cult was neg-d/c zosyn cxr neg  Rhabdomyolysis #dermatomyositis or polymyositis however no pathognomic rash-CK still above 50,000 she feels 85% better-D/w Dr. Jimmy Footman of nephrology-not much else would do-Rhabdo could take upto 1 week to resolve--getting  Antiro,  anti-la,  , tsh,  aldolase and follow Changing IV fluid to bicarb Starting empiric steroids Solu-Medrol 8/18 and see result  Transaminitis-2/2 Rhabdo--no further work-up as bili and alk phone nl  Severe hypokalemia likely secondary to sweating and rhabdo-was in ivf--now will d/c as K normal  Thrombocytopenia is relative but reactive probably we will monitor closely   DVT prophylaxis: lovenoex code Status: Full  family Communication:   none Disposition Plan: Inpatient   Kayla Lockner, MD  [facility] Direct contact: [phone] --Via amion app OR  --[email or web address]; password [email or web address]  7PM-7AM contact night coverage as above 07/20/2018, 11:00 AM  LOS: 4 days   Consultants:  None yet telephone consulted ID  Procedures:  T abdomen pelvis  Antimicrobials:  Zosyn 8/15 a.m.  Interval history/Subjective:  Sleepy this morning but awakens readily No rash Overall weak in the mainly pelvic girdle  Objective:  Vitals:  Vitals:   07/19/18 2200 07/20/18 0540  BP: (!)  147/103 140/86  Pulse: 91 71  Resp:  16  Temp: 98.3 F (36.8 C) 97.8 F (36.6 C)  SpO2: 100% 100%    Exam: Awake alert pleasant no icterus no pallor  Chest clinically clear no added sound Abdomen soft nontender no rebound tolerating diet No rash Able to squat down however weakness on getting back up raising concern for proximal muscle weakness  I have personally reviewed the following:   Labs:  WBC 3.6 CRP 2.1 sed rate 43  Imaging studies:  CT abdomen pelvis as above  Medical tests:  No  Test discussed with performing physician:  No  Decision to obtain old records:  Yes  Review and summation of old records:  Yes  Scheduled Meds: . methylPREDNISolone (SOLU-MEDROL) injection  40 mg Intravenous Q6H  . metoprolol tartrate  12.5 mg Oral BID   Continuous Infusions: . sodium chloride Stopped (07/20/18 1015)  . sodium chloride 0.45 % 1,000 mL with sodium bicarbonate 100 mEq infusion 100 mL/hr at 07/20/18 1020    Active Problems:   Rhabdomyolysis   LOS: 4 days

## 2018-07-20 NOTE — Plan of Care (Signed)
  Problem: Activity: Goal: Risk for activity intolerance will decrease Outcome: Progressing   Problem: Nutrition: Goal: Adequate nutrition will be maintained Outcome: Progressing   Problem: Coping: Goal: Level of anxiety will decrease Outcome: Progressing   

## 2018-07-21 LAB — ALDOLASE: Aldolase: <1.2 U/L (ref 3.3–10.3)

## 2018-07-21 LAB — ANTI-SCLERODERMA ANTIBODY: Scleroderma (Scl-70) (ENA) Antibody, IgG: <0.2 AI (ref 0.0–0.9)

## 2018-07-21 LAB — ANTI-JO 1 ANTIBODY, IGG: Anti JO-1: <0.2 AI (ref 0.0–0.9)

## 2018-07-21 LAB — ENTEROVIRUS PCR: Enterovirus PCR: NEGATIVE

## 2018-07-21 LAB — ANTI-DNA ANTIBODY, DOUBLE-STRANDED: ds DNA Ab: 1 IU/mL (ref 0–9)

## 2018-07-21 MED ORDER — HYDRALAZINE HCL 20 MG/ML IJ SOLN
5.0000 mg | INTRAMUSCULAR | Status: DC | PRN
  Administered 2018-07-21 – 2018-07-22 (×2): 5 mg via INTRAVENOUS
  Filled 2018-07-21 (×4): qty 1

## 2018-07-21 MED ORDER — METOPROLOL TARTRATE 25 MG PO TABS
25.0000 mg | ORAL_TABLET | Freq: Two times a day (BID) | ORAL | Status: DC
  Administered 2018-07-21 – 2018-07-26 (×10): 25 mg via ORAL
  Filled 2018-07-21 (×10): qty 1

## 2018-07-21 MED ORDER — PREDNISONE 20 MG PO TABS
40.0000 mg | ORAL_TABLET | Freq: Every day | ORAL | Status: DC
  Administered 2018-07-22 – 2018-07-23 (×2): 40 mg via ORAL
  Filled 2018-07-21 (×2): qty 2

## 2018-07-21 NOTE — Progress Notes (Signed)
[facility] PROGRESS NOTE  Shaine Mount TKP:[id number] DOB: 1981-09-25 DOA: 07/16/2018 PCP: Patient, No Pcp Per   Narrative: 37 year old female with essentially no medical illnesses admitted by myself on 8/14 with persisting fever nausea vomiting after having been treated in the ED on 8/12 for viral illness And found to have slightly elevated LFTs pressed white count, rhabdomyolysis above 50,000 uncomfortable abdomen   A & Plan Fever query cause-DDX rhabdomyolysis versus viral illness-discussed with Dr. Johnnye Sima on 8/15 obtaining coxsackie , parvovirus, Epstein-Barr and CMV-blood cultures performed on 8/14 PM given spiking fevers are neg Blood cult was neg-d/c zosyn cxr neg  Rhabdomyolysis #dermatomyositis or polymyositis however no pathognomic rash-CK tomorrow -D/w Dr. Jimmy Footman of nephrology-not much else would do-Rhabdo could take upto 1-2 weeks to resolve  Antiro neg  anti-la neg , tsh neg Aldolase neg Starting empiric steroids Solu-Medrol 8/18 -Improved somewhat on steroids-changing to po prednisone--see labs in am If no drop in CK, will get muscle biopsy and consider d/w Rheum  Transaminitis-2/2 Rhabdo--no further work-up as bili and alk phone nl  Severe hypokalemia likely secondary to sweating and rhabdo-was in ivf--recheck am  Thrombocytopenia is relative but reactive   DVT prophylaxis: lovenoex code Status: Full  family Communication:   none Disposition Plan: Inpatient   Austina Constantin, MD  [facility] Direct contact: [phone] --Via amion app OR  --[email or web address]; password [email or web address]  7PM-7AM contact night coverage as above 07/21/2018, 4:26 PM  LOS: 5 days   Consultants:  None yet telephone consulted ID  Procedures:  T abdomen pelvis  Antimicrobials:  Zosyn 8/15 a.m.  Interval history/Subjective:  "ifeel full of energy" No wekaness this am No n/v No cp Eating well Passing urine  Objective:  Vitals:  Vitals:   07/21/18 1028 07/21/18 1555   BP: (!) 160/98 (!) 171/106  Pulse: 95 82  Resp:  18  Temp:  98 F (36.7 C)  SpO2:  100%    Exam: Awake alert pleasant no icterus no pallor  Chest clinically clear no added sound Abdomen soft nontender no rebound tolerating diet No rash No new weakness I have personally reviewed the following:   Labs:  Imaging studies:  CT abdomen pelvis as above  Medical tests:  No  Test discussed with performing physician:  No  Decision to obtain old records:  Yes  Review and summation of old records:  Yes  Scheduled Meds: . metoprolol tartrate  25 mg Oral BID  . [START ON 07/22/2018] predniSONE  40 mg Oral QAC breakfast   Continuous Infusions: . sodium chloride Stopped (07/20/18 1015)  . sodium chloride 0.45 % 1,000 mL with sodium bicarbonate 100 mEq infusion 100 mL/hr at 07/21/18 1030    Active Problems:   Rhabdomyolysis   LOS: 5 days

## 2018-07-21 NOTE — Progress Notes (Signed)
Pt's b/p was elevated, notified Dr. Mahala MenghiniSamtani, and orders were received and followed.  Pt's b/p was check after, and improved

## 2018-07-22 LAB — CBC WITH DIFFERENTIAL/PLATELET
Abs Immature Granulocytes: 0.1 10*3/uL (ref 0.0–0.1)
Basophils Absolute: 0.1 10*3/uL (ref 0.0–0.1)
Basophils Relative: 1 %
EOS ABS: 0 10*3/uL (ref 0.0–0.7)
Eosinophils Relative: 0 %
HEMATOCRIT: 39.5 % (ref 36.0–46.0)
Hemoglobin: 12.8 g/dL (ref 12.0–15.0)
Immature Granulocytes: 2 %
LYMPHS ABS: 2.6 10*3/uL (ref 0.7–4.0)
Lymphocytes Relative: 28 %
MCH: 31.1 pg (ref 26.0–34.0)
MCHC: 32.4 g/dL (ref 30.0–36.0)
MCV: 95.9 fL (ref 78.0–100.0)
Monocytes Absolute: 1 10*3/uL (ref 0.1–1.0)
Monocytes Relative: 10 %
Neutro Abs: 5.6 10*3/uL (ref 1.7–7.7)
Neutrophils Relative %: 59 %
Platelets: 256 10*3/uL (ref 150–400)
RBC: 4.12 MIL/uL (ref 3.87–5.11)
RDW: 13.2 % (ref 11.5–15.5)
WBC: 9.4 10*3/uL (ref 4.0–10.5)

## 2018-07-22 LAB — CULTURE, BLOOD (ROUTINE X 2)
Culture: NO GROWTH
Culture: NO GROWTH
Special Requests: ADEQUATE
Special Requests: ADEQUATE

## 2018-07-22 LAB — COMPREHENSIVE METABOLIC PANEL
ALT: 234 U/L — ABNORMAL HIGH (ref 0–44)
AST: 587 U/L — AB (ref 15–41)
Albumin: 2.7 g/dL — ABNORMAL LOW (ref 3.5–5.0)
Alkaline Phosphatase: 38 U/L (ref 38–126)
Anion gap: 5 (ref 5–15)
BUN: 11 mg/dL (ref 6–20)
CHLORIDE: 103 mmol/L (ref 98–111)
CO2: 31 mmol/L (ref 22–32)
CREATININE: 0.7 mg/dL (ref 0.44–1.00)
Calcium: 8.4 mg/dL — ABNORMAL LOW (ref 8.9–10.3)
GFR calc Af Amer: >60 mL/min (ref >60)
Glucose, Bld: 84 mg/dL (ref 70–99)
Potassium: 3.3 mmol/L — ABNORMAL LOW (ref 3.5–5.1)
Sodium: 139 mmol/L (ref 135–145)
Total Bilirubin: 0.4 mg/dL (ref 0.3–1.2)
Total Protein: 5.6 g/dL — ABNORMAL LOW (ref 6.5–8.1)

## 2018-07-22 LAB — CK: Total CK: >50000 U/L — ABNORMAL HIGH (ref 38–234)

## 2018-07-22 NOTE — Progress Notes (Signed)
[facility] PROGRESS NOTE  Kayla Adkins GYB:[id number] DOB: 28-Feb-1981 DOA: 07/16/2018 PCP: Patient, No Pcp Per   Narrative: 37 year old female with essentially no medical illnesses admitted by myself on 8/14 with persisting fever nausea vomiting after having been treated in the ED on 8/12 for viral illness And found to have slightly elevated LFTs pressed white count, rhabdomyolysis above 50,000 uncomfortable abdomen   A & Plan Fever query cause-DDX rhabdomyolysis versus viral illness-discussed with Dr. Johnnye Sima on 8/15 obtaining coxsackie , parvovirus, Epstein-Barr and CMV-BC X2 as below Blood cult was neg-d/c zosyn cxr neg  Rhabdomyolysis #dermatomyositis or polymyositis however no pathognomic rash-CK tomorrow -D/w Dr. Jimmy Footman of nephrology-not much else would do-Rhabdo could take upto 1-2 weeks to resolve  Antiro neg  anti-la neg tsh neg Aldolase neg Starting empiric steroids Solu-Medrol 8/18 -Improved somewhat on steroids-p.o. prednisone current dose Getting muscle biopsy --I have contacted Dr. Lenna Gilford of rheumatology for outpatient follow-up  TSH added on as lab work  Transaminitis-2/2 Rhabdo--no further work-up as bili and alk phone nl  Severe hypokalemia likely secondary to sweating and rhabdo-was in ivf--recheck am  Thrombocytopenia is relative but reactive  Hypertension continue metoprolol   DVT prophylaxis: lovenoex code Status: Full  family Communication:   none Disposition Plan: Inpatient   Teryn Gust, MD  [facility] Direct contact: [phone] --Via amion app OR  --[email or web address]; password [email or web address]  7PM-7AM contact night coverage as above 07/22/2018, 2:05 PM  LOS: 6 days   Consultants:  None yet telephone consulted ID  Procedures:  T abdomen pelvis  Antimicrobials:  Zosyn 8/15 a.m.  Interval history/Subjective:  Awoke her from afternoon sleep No new issue Eating drinking No fever no chills Abdominal pain no  Objective:  Vitals:   Vitals:   07/21/18 2130 07/22/18 0600  BP: (!) 163/96 (!) 149/51  Pulse: 67 68  Resp: 16 18  Temp: 98.2 F (36.8 C) 99.2 F (37.3 C)  SpO2: 100% 97%    Exam: Awake alert pleasant no icterus no pallor  Chest clinically clear no added sound Abdomen no epigastric tenderness no rebound No rash No new weakness I have personally reviewed the following:   Labs:  Blood culture no growth since 8/15  Imaging studies:  CT abdomen pelvis as above  Medical tests:  No  Test discussed with performing physician:  No  Decision to obtain old records:  Yes  Review and summation of old records:  Yes  Scheduled Meds: . metoprolol tartrate  25 mg Oral BID  . predniSONE  40 mg Oral QAC breakfast   Continuous Infusions: . sodium chloride Stopped (07/20/18 1015)  . sodium chloride 0.45 % 1,000 mL with sodium bicarbonate 100 mEq infusion 100 mL/hr at 07/22/18 0650    Active Problems:   Rhabdomyolysis   LOS: 6 days

## 2018-07-22 NOTE — Consult Note (Signed)
[facility] Surgery Consult/Admission Note  Kayla Adkins Feb 03, 1981  [id number].    Requesting MD: Dr. [name] Chief Complaint/Reason for Consult: muscle biopsy  HPI:   Pt is a 37 yo female with no significant past medical history who was admitted on 08/14 for persistent fever, nausea, vomiting, body aches and CK >50K. Pt states she had fevers up to 106. She states she felt like she had a swollen lymph node behind her left ear before symptoms started but this is gone. She was seen in the ED on 08/12 for these symptoms but sent home. Pt states her son had similar symptoms roughly 2-3 weeks ago. Her husband has not been sick. Of note: pt's AST and ALT are newly elevated since 08/12. Pt states no new medications, no drug or recent ETOH use, no exercise or trauma. She began having symptoms on 08/10. She feels fine now with no nausea, vomiting, or fevers or other symptoms. We were asked to perform a muscle biopsy.  Only past surgery is C section. No anticoagulation use.   ROS:  Review of Systems  Constitutional: Positive for chills, fever and malaise/fatigue. Negative for diaphoresis and weight loss.  HENT: Negative for sore throat.   Respiratory: Negative for cough and shortness of breath.   Cardiovascular: Negative for chest pain.  Gastrointestinal: Positive for nausea and vomiting. Negative for abdominal pain, blood in stool, constipation and diarrhea.  Genitourinary: Negative for dysuria.  Musculoskeletal: Positive for myalgias. Negative for falls.  Skin: Negative for rash.  Neurological: Negative for dizziness, focal weakness, seizures, loss of consciousness and weakness.  All other systems reviewed and are negative.    History reviewed. No pertinent family history.  Past Medical History:  Diagnosis Date  . Hypertension   . Vertigo     Past Surgical History:  Procedure Laterality Date  . CESAREAN SECTION      Social History:  reports that she has been smoking  cigarettes. She has a 0.50 pack-year smoking history. She has never used smokeless tobacco. She reports that she drinks alcohol. She reports that she does not use drugs.  Allergies: No Known Allergies  Medications Prior to Admission  Medication Sig Dispense Refill  . acetaminophen (TYLENOL) 500 MG tablet Take 1,000 mg by mouth every 6 (six) hours as needed for mild pain.    . cyclobenzaprine (FLEXERIL) 10 MG tablet Take 1 tablet (10 mg total) by mouth 2 (two) times daily as needed for muscle spasms. 20 tablet 0  . meclizine (ANTIVERT) 25 MG tablet Take 1 tablet (25 mg total) by mouth 3 (three) times daily as needed for dizziness. 30 tablet 0  . meloxicam (MOBIC) 7.5 MG tablet Take 2 tablets (15 mg total) by mouth daily. 30 tablet 0  . ondansetron (ZOFRAN ODT) 4 MG disintegrating tablet Take 1 tablet (4 mg total) by mouth every 8 (eight) hours as needed for nausea. 10 tablet 0  . HYDROcodone-acetaminophen (NORCO/VICODIN) 5-325 MG tablet Take 1 tablet by mouth every 4 (four) hours as needed. (Patient not taking: Reported on 07/16/2018) 10 tablet 0  . ibuprofen (ADVIL,MOTRIN) 600 MG tablet Take 1 tablet (600 mg total) by mouth every 6 (six) hours as needed. (Patient not taking: Reported on 07/16/2018) 30 tablet 0  . ondansetron (ZOFRAN) 4 MG tablet Take 1 tablet (4 mg total) by mouth every 6 (six) hours. (Patient not taking: Reported on 07/16/2018) 12 tablet 0    Blood pressure (!) 149/51, pulse 68, temperature 99.2 F (37.3 C), temperature source Oral,  resp. rate 18, height '5\' 6"'  (1.676 m), weight 64.4 kg, last menstrual period 06/24/2018, SpO2 97 %.  Physical Exam  Constitutional: She is oriented to person, place, and time. She appears well-developed and well-nourished. No distress.  HENT:  Head: Normocephalic and atraumatic.  Nose: Nose normal.  Mouth/Throat: Uvula is midline, oropharynx is clear and moist and mucous membranes are normal. No oropharyngeal exudate.  Eyes: Pupils are equal,  round, and reactive to light. Conjunctivae are normal. Right eye exhibits no discharge. Left eye exhibits no discharge. No scleral icterus.  Neck: Normal range of motion. Neck supple. No thyromegaly present.  Cardiovascular: Normal rate, regular rhythm, normal heart sounds and intact distal pulses.  No murmur heard. Pulses:      Radial pulses are 2+ on the right side, and 2+ on the left side.       Dorsalis pedis pulses are 2+ on the right side, and 2+ on the left side.  Pulmonary/Chest: Effort normal and breath sounds normal. No respiratory distress. She has no wheezes. She has no rhonchi. She has no rales.  Abdominal: Soft. Normal appearance and bowel sounds are normal. She exhibits no distension. There is no hepatosplenomegaly. There is no tenderness. There is no rigidity and no guarding.  Musculoskeletal: Normal range of motion. She exhibits no edema, tenderness or deformity.  Lymphadenopathy:    She has no cervical adenopathy.  Neurological: She is alert and oriented to person, place, and time.  Skin: Skin is warm and dry. No rash noted. She is not diaphoretic.  Psychiatric: She has a normal mood and affect.  Nursing note and vitals reviewed.   Results for orders placed or performed during the hospital encounter of 07/16/18 (from the past 48 hour(s))  Comprehensive metabolic panel     Status: Abnormal   Collection Time: 07/22/18  6:49 AM  Result Value Ref Range   Sodium 139 135 - 145 mmol/L   Potassium 3.3 (L) 3.5 - 5.1 mmol/L   Chloride 103 98 - 111 mmol/L   CO2 31 22 - 32 mmol/L   Glucose, Bld 84 70 - 99 mg/dL   BUN 11 6 - 20 mg/dL   Creatinine, Ser 0.70 0.44 - 1.00 mg/dL   Calcium 8.4 (L) 8.9 - 10.3 mg/dL   Total Protein 5.6 (L) 6.5 - 8.1 g/dL   Albumin 2.7 (L) 3.5 - 5.0 g/dL   AST 587 (H) 15 - 41 U/L   ALT 234 (H) 0 - 44 U/L   Alkaline Phosphatase 38 38 - 126 U/L   Total Bilirubin 0.4 0.3 - 1.2 mg/dL   GFR calc non Af Amer >60 >60 mL/min   GFR calc Af Amer >60 >60 mL/min     Comment: (NOTE) The eGFR has been calculated using the CKD EPI equation. This calculation has not been validated in all clinical situations. eGFR's persistently <60 mL/min signify possible Chronic Kidney Disease.    Anion gap 5 5 - 15    Comment: Performed at [facility] [address] [place], [place] [address]  CBC with Differential/Platelet     Status: None   Collection Time: 07/22/18  6:49 AM  Result Value Ref Range   WBC 9.4 4.0 - 10.5 K/uL   RBC 4.12 3.87 - 5.11 MIL/uL   Hemoglobin 12.8 12.0 - 15.0 g/dL   HCT 39.5 36.0 - 46.0 %   MCV 95.9 78.0 - 100.0 fL   MCH 31.1 26.0 - 34.0 pg   MCHC 32.4 30.0 -  36.0 g/dL   RDW 13.2 11.5 - 15.5 %   Platelets 256 150 - 400 K/uL   Neutrophils Relative % 59 %   Neutro Abs 5.6 1.7 - 7.7 K/uL   Lymphocytes Relative 28 %   Lymphs Abs 2.6 0.7 - 4.0 K/uL   Monocytes Relative 10 %   Monocytes Absolute 1.0 0.1 - 1.0 K/uL   Eosinophils Relative 0 %   Eosinophils Absolute 0.0 0.0 - 0.7 K/uL   Basophils Relative 1 %   Basophils Absolute 0.1 0.0 - 0.1 K/uL   Immature Granulocytes 2 %   Abs Immature Granulocytes 0.1 0.0 - 0.1 K/uL    Comment: Performed at [facility] [address], [place], [place] [address]  CK     Status: Abnormal   Collection Time: 07/22/18  6:49 AM  Result Value Ref Range   Total CK >50,000 (H) 38 - 234 U/L    Comment: RESULTS CONFIRMED BY MANUAL DILUTION Performed at [facility], [place] [address] [place], [place] [address]    No results found.    Assessment/Plan Active Problems:   Rhabdomyolysis  We could potentially perform muscle biopsy tomorrow in OR. Will discuss timing with MD.   Thank you for the very interesting consult.   Kalman Drape, [facility] Surgery 07/22/2018, 1:50 PM Pager: [phone] Consults: [phone] Mon-Fri 7:00 am-4:30 pm Sat-Sun 7:00 am-11:30 am

## 2018-07-23 LAB — CBC WITH DIFFERENTIAL/PLATELET
Abs Immature Granulocytes: 0.2 10*3/uL — ABNORMAL HIGH (ref 0.0–0.1)
BASOS ABS: 0 10*3/uL (ref 0.0–0.1)
BASOS PCT: 0 %
EOS ABS: 0 10*3/uL (ref 0.0–0.7)
EOS PCT: 0 %
HCT: 35.2 % — ABNORMAL LOW (ref 36.0–46.0)
Hemoglobin: 11.4 g/dL — ABNORMAL LOW (ref 12.0–15.0)
Immature Granulocytes: 2 %
Lymphocytes Relative: 33 %
Lymphs Abs: 3 10*3/uL (ref 0.7–4.0)
MCH: 30.3 pg (ref 26.0–34.0)
MCHC: 32.4 g/dL (ref 30.0–36.0)
MCV: 93.6 fL (ref 78.0–100.0)
MONO ABS: 1 10*3/uL (ref 0.1–1.0)
Monocytes Relative: 11 %
Neutro Abs: 4.9 10*3/uL (ref 1.7–7.7)
Neutrophils Relative %: 54 %
PLATELETS: 313 10*3/uL (ref 150–400)
RBC: 3.76 MIL/uL — ABNORMAL LOW (ref 3.87–5.11)
RDW: 13 % (ref 11.5–15.5)
WBC: 9.1 10*3/uL (ref 4.0–10.5)

## 2018-07-23 LAB — COMPREHENSIVE METABOLIC PANEL
ALBUMIN: 2.7 g/dL — AB (ref 3.5–5.0)
ALT: 219 U/L — AB (ref 0–44)
ANION GAP: 5 (ref 5–15)
AST: 433 U/L — ABNORMAL HIGH (ref 15–41)
Alkaline Phosphatase: 39 U/L (ref 38–126)
BUN: 8 mg/dL (ref 6–20)
CALCIUM: 8.1 mg/dL — AB (ref 8.9–10.3)
CO2: 28 mmol/L (ref 22–32)
CREATININE: 0.56 mg/dL (ref 0.44–1.00)
Chloride: 105 mmol/L (ref 98–111)
GFR calc Af Amer: >60 mL/min (ref >60)
GFR calc non Af Amer: >60 mL/min (ref >60)
GLUCOSE: 87 mg/dL (ref 70–99)
Potassium: 3.5 mmol/L (ref 3.5–5.1)
Sodium: 138 mmol/L (ref 135–145)
TOTAL PROTEIN: 5.6 g/dL — AB (ref 6.5–8.1)
Total Bilirubin: 0.4 mg/dL (ref 0.3–1.2)

## 2018-07-23 LAB — TSH: TSH: 2.423 u[IU]/mL (ref 0.350–4.500)

## 2018-07-23 LAB — CK: CK TOTAL: 7013 U/L — AB (ref 38–234)

## 2018-07-23 NOTE — Progress Notes (Signed)
[facility] PROGRESS NOTE  Kayla Adkins WPV:[id number] DOB: 11-29-1981 DOA: 07/16/2018 PCP: Patient, No Pcp Per   Narrative: 37 year old female with essentially no medical illnesses admitted by myself on 8/14 with persisting fever nausea vomiting after having been treated in the ED on 8/12 for viral illness And found to have slightly elevated LFTs pressed white count, rhabdomyolysis above 50,000 uncomfortable abdomen   A & Plan Fever query cause-DDX rhabdomyolysis versus viral illness-discussed with Dr. Johnnye Sima on 8/15 obtaining coxsackie , parvovirus, Epstein-Barr and CMV-BC X2 as below Blood cult was neg-d/c zosyn cxr neg  Rhabdomyolysis #dermatomyositis or polymyositis however no pathognomic rash-CK tomorrow -D/w Dr. Jimmy Footman of nephrology-not much else would do-Rhabdo could take upto 1-2 weeks to resolve  Antiro neg  anti-la neg tsh neg Aldolase neg Starting empiric steroids Solu-Medrol 8/18 -Improved somewhat on steroids-p.o. prednisone current dose Getting muscle biopsy --shave general surgery input-TSH normal-CK now down to 7000 Dr. Trudie Reed declines discussion so will ask another Rheumatologist to follow  Transaminitis-2/2 Rhabdo--no further work-up as bili and alk phone nl  Severe hypokalemia likely secondary to sweating and rhabdo-IV fluids 8/21  Thrombocytopenia is relative but reactive  Hypertension continue metoprolol   DVT prophylaxis: lovenoex code Status: Full  family Communication:   none Disposition Plan: We will discharge home once biopsy is done and follow-up with rheumatology can be arranged  [name], MD  [facility] Direct contact: [phone] --Via amion app OR  --[email or web address]; password [email or web address]  7PM-7AM contact night coverage as above 07/23/2018, 8:54 AM  LOS: 7 days   Consultants:  Phone consulted ID  Telephone consulted renal  Attempted to consult your rheumatology-now attempt for another rheumatological eval  General surgery  consult for biopsy  Procedures:  T abdomen pelvis  Antimicrobials:  Zosyn 8/15 a.m.  Interval history/Subjective:  Overall much improved looks well-sleeping well passing urine  Objective:  Vitals:  Vitals:   07/23/18 0159 07/23/18 0548  BP: (!) 142/92 138/74  Pulse:  79  Resp:  19  Temp:  98.8 F (37.1 C)  SpO2:  99%    Exam:  Pleasant Chest clear S1-S2 Able to squat down and come back up without issue and pain where she was able to do so earlier  I have personally reviewed the following:   Labs:  Blood culture no growth since 8/15  Imaging studies:  CT abdomen pelvis as above  Medical tests:  No  Test discussed with performing physician:  No  Decision to obtain old records:  Yes  Review and summation of old records:  Yes  Scheduled Meds: . metoprolol tartrate  25 mg Oral BID  . predniSONE  40 mg Oral QAC breakfast   Continuous Infusions: . sodium chloride Stopped (07/20/18 1015)  . sodium chloride 0.45 % 1,000 mL with sodium bicarbonate 100 mEq infusion 100 mL/hr at 07/23/18 0549    Active Problems:   Rhabdomyolysis   LOS: 7 days

## 2018-07-23 NOTE — Progress Notes (Signed)
Patient ID: Kirtland BouchardVaronica Hefel, female   DOB: [birth year], 37 y.o.   MRN: [id number]       Subjective: No new complaints today.  Objective: Vital signs in last 24 hours: Temp:  [98.4 F (36.9 C)-99 F (37.2 C)] 98.8 F (37.1 C) (08/21 0548) Pulse Rate:  [79-97] 79 (08/21 0548) Resp:  [16-20] 19 (08/21 0548) BP: (136-180)/(74-113) 138/74 (08/21 0548) SpO2:  [99 %-100 %] 99 % (08/21 0548) Last BM Date: 07/18/18  Intake/Output from previous day: 08/20 0701 - 08/21 0700 In: 1427.4 [P.O.:360; I.V.:1067.4] Out: -  Intake/Output this shift: No intake/output data recorded.  PE: Heart: regular Lungs: CTAB  Lab Results:  Recent Labs    07/22/18 0649 07/23/18 0507  WBC 9.4 9.1  HGB 12.8 11.4*  HCT 39.5 35.2*  PLT 256 313   BMET Recent Labs    07/22/18 0649 07/23/18 0507  NA 139 138  K 3.3* 3.5  CL 103 105  CO2 31 28  GLUCOSE 84 87  BUN 11 8  CREATININE 0.70 0.56  CALCIUM 8.4* 8.1*   PT/INR No results for input(s): LABPROT, INR in the last 72 hours. CMP     Component Value Date/Time   NA 138 07/23/2018 0507   K 3.5 07/23/2018 0507   CL 105 07/23/2018 0507   CO2 28 07/23/2018 0507   GLUCOSE 87 07/23/2018 0507   BUN 8 07/23/2018 0507   CREATININE 0.56 07/23/2018 0507   CALCIUM 8.1 (L) 07/23/2018 0507   PROT 5.6 (L) 07/23/2018 0507   ALBUMIN 2.7 (L) 07/23/2018 0507   AST 433 (H) 07/23/2018 0507   ALT 219 (H) 07/23/2018 0507   ALKPHOS 39 07/23/2018 0507   BILITOT 0.4 07/23/2018 0507   GFRNONAA >60 07/23/2018 0507   GFRAA >60 07/23/2018 0507   Lipase     Component Value Date/Time   LIPASE 54 (H) 07/16/2018 1144       Studies/Results: No results found.  Anti-infectives: Anti-infectives (From admission, onward)   Start     Dose/Rate Route Frequency Ordered Stop   07/17/18 0715  piperacillin-tazobactam (ZOSYN) IVPB 3.375 g  Status:  Discontinued     3.375 g 12.5 mL/hr over 240 Minutes Intravenous Every 8 hours 07/17/18 0649 07/19/18 1325        Assessment/Plan Rhabdomyolysis -CK down to 7000 today.  Patient very relieved! -will discuss timing and need of muscle bx with MD.  FEN - regular diet VTE - SCDs ID - none   LOS: 7 days    Letha CapeKelly E Donivin Wirt , [facility] 07/23/2018, 8:27 AM Pager: [phone]

## 2018-07-24 ENCOUNTER — Inpatient Hospital Stay (HOSPITAL_COMMUNITY): Payer: Medicaid Other | Admitting: Certified Registered Nurse Anesthetist

## 2018-07-24 ENCOUNTER — Encounter (HOSPITAL_COMMUNITY): Payer: Self-pay | Admitting: Orthopedic Surgery

## 2018-07-24 ENCOUNTER — Encounter (HOSPITAL_COMMUNITY)
Admission: EM | Disposition: A | Discharge status: Home / Self Care | Payer: Self-pay | Source: Home / Self Care | Attending: Family Medicine

## 2018-07-24 HISTORY — PX: MUSCLE BIOPSY: SHX716

## 2018-07-24 LAB — COMPREHENSIVE METABOLIC PANEL
ALT: 218 U/L — ABNORMAL HIGH (ref 0–44)
AST: 297 U/L — AB (ref 15–41)
Albumin: 2.7 g/dL — ABNORMAL LOW (ref 3.5–5.0)
Alkaline Phosphatase: 46 U/L (ref 38–126)
Anion gap: 6 (ref 5–15)
BUN: 12 mg/dL (ref 6–20)
CHLORIDE: 105 mmol/L (ref 98–111)
CO2: 28 mmol/L (ref 22–32)
Calcium: 8.3 mg/dL — ABNORMAL LOW (ref 8.9–10.3)
Creatinine, Ser: 0.61 mg/dL (ref 0.44–1.00)
GFR calc Af Amer: >60 mL/min (ref >60)
GFR calc non Af Amer: >60 mL/min (ref >60)
Glucose, Bld: 100 mg/dL — ABNORMAL HIGH (ref 70–99)
POTASSIUM: 3.1 mmol/L — AB (ref 3.5–5.1)
Sodium: 139 mmol/L (ref 135–145)
Total Bilirubin: 0.2 mg/dL — ABNORMAL LOW (ref 0.3–1.2)
Total Protein: 5.7 g/dL — ABNORMAL LOW (ref 6.5–8.1)

## 2018-07-24 LAB — CBC WITH DIFFERENTIAL/PLATELET
Abs Immature Granulocytes: 0.1 10*3/uL (ref 0.0–0.1)
Basophils Absolute: 0 10*3/uL (ref 0.0–0.1)
Basophils Relative: 0 %
EOS PCT: 0 %
Eosinophils Absolute: 0 10*3/uL (ref 0.0–0.7)
HCT: 34.6 % — ABNORMAL LOW (ref 36.0–46.0)
HEMOGLOBIN: 11.4 g/dL — AB (ref 12.0–15.0)
Immature Granulocytes: 2 %
LYMPHS ABS: 3.3 10*3/uL (ref 0.7–4.0)
LYMPHS PCT: 38 %
MCH: 30.9 pg (ref 26.0–34.0)
MCHC: 32.9 g/dL (ref 30.0–36.0)
MCV: 93.8 fL (ref 78.0–100.0)
Monocytes Absolute: 0.8 10*3/uL (ref 0.1–1.0)
Monocytes Relative: 10 %
Neutro Abs: 4.4 10*3/uL (ref 1.7–7.7)
Neutrophils Relative %: 50 %
Platelets: 357 10*3/uL (ref 150–400)
RBC: 3.69 MIL/uL — AB (ref 3.87–5.11)
RDW: 12.9 % (ref 11.5–15.5)
WBC: 8.7 10*3/uL (ref 4.0–10.5)

## 2018-07-24 LAB — SURGICAL PCR SCREEN
MRSA, PCR: NEGATIVE
Staphylococcus aureus: NEGATIVE

## 2018-07-24 LAB — CK: Total CK: 14648 U/L — ABNORMAL HIGH (ref 38–234)

## 2018-07-24 SURGERY — MUSCLE BIOPSY
Anesthesia: Monitor Anesthesia Care | Site: Thigh | Laterality: Right

## 2018-07-24 MED ORDER — CEFAZOLIN SODIUM-DEXTROSE 2-4 GM/100ML-% IV SOLN
2.0000 g | INTRAVENOUS | Status: AC
  Administered 2018-07-24: 2 g via INTRAVENOUS
  Filled 2018-07-24 (×2): qty 100

## 2018-07-24 MED ORDER — FENTANYL CITRATE (PF) 250 MCG/5ML IJ SOLN
INTRAMUSCULAR | Status: DC | PRN
  Administered 2018-07-24: 50 ug via INTRAVENOUS

## 2018-07-24 MED ORDER — LIDOCAINE 2% (20 MG/ML) 5 ML SYRINGE
INTRAMUSCULAR | Status: DC | PRN
  Administered 2018-07-24: 40 mg via INTRAVENOUS

## 2018-07-24 MED ORDER — TRAMADOL HCL 50 MG PO TABS
50.0000 mg | ORAL_TABLET | Freq: Four times a day (QID) | ORAL | Status: DC
  Administered 2018-07-24 – 2018-07-25 (×4): 50 mg via ORAL
  Filled 2018-07-24 (×6): qty 1

## 2018-07-24 MED ORDER — MIDAZOLAM HCL 2 MG/2ML IJ SOLN
INTRAMUSCULAR | Status: AC
  Filled 2018-07-24: qty 2

## 2018-07-24 MED ORDER — ROCURONIUM BROMIDE 50 MG/5ML IV SOSY
PREFILLED_SYRINGE | INTRAVENOUS | Status: AC
  Filled 2018-07-24: qty 5

## 2018-07-24 MED ORDER — PROPOFOL 500 MG/50ML IV EMUL
INTRAVENOUS | Status: DC | PRN
  Administered 2018-07-24: 75 ug/kg/min via INTRAVENOUS

## 2018-07-24 MED ORDER — ONDANSETRON HCL 4 MG/2ML IJ SOLN: 4.0000 mg | Freq: Once | INTRAMUSCULAR | Status: DC | PRN

## 2018-07-24 MED ORDER — DIPHENHYDRAMINE HCL 50 MG/ML IJ SOLN
INTRAMUSCULAR | Status: AC
  Filled 2018-07-24: qty 1

## 2018-07-24 MED ORDER — METHYLPREDNISOLONE SODIUM SUCC 40 MG IJ SOLR
30.0000 mg | Freq: Two times a day (BID) | INTRAMUSCULAR | Status: DC
  Administered 2018-07-24 – 2018-07-25 (×4): 30 mg via INTRAVENOUS
  Filled 2018-07-24 (×4): qty 1

## 2018-07-24 MED ORDER — FENTANYL CITRATE (PF) 100 MCG/2ML IJ SOLN: 25.0000 ug | INTRAMUSCULAR | Status: DC | PRN

## 2018-07-24 MED ORDER — LIDOCAINE HCL 2 % IJ SOLN
INTRAMUSCULAR | Status: DC | PRN
  Administered 2018-07-24: 7 mL

## 2018-07-24 MED ORDER — PROPOFOL 1000 MG/100ML IV EMUL
INTRAVENOUS | Status: AC
  Filled 2018-07-24: qty 100

## 2018-07-24 MED ORDER — TRAMADOL HCL 50 MG PO TABS
ORAL_TABLET | ORAL | Status: AC
  Filled 2018-07-24: qty 1

## 2018-07-24 MED ORDER — ONDANSETRON HCL 4 MG/2ML IJ SOLN
INTRAMUSCULAR | Status: DC | PRN
  Administered 2018-07-24: 4 mg via INTRAVENOUS

## 2018-07-24 MED ORDER — 0.9 % SODIUM CHLORIDE (POUR BTL) OPTIME
TOPICAL | Status: DC | PRN
Start: 2018-07-24 — End: 2018-07-24
  Administered 2018-07-24: 1000 mL

## 2018-07-24 MED ORDER — MIDAZOLAM HCL 5 MG/5ML IJ SOLN
INTRAMUSCULAR | Status: DC | PRN
  Administered 2018-07-24: 2 mg via INTRAVENOUS

## 2018-07-24 MED ORDER — ONDANSETRON HCL 4 MG/2ML IJ SOLN
INTRAMUSCULAR | Status: AC
  Filled 2018-07-24: qty 2

## 2018-07-24 MED ORDER — LACTATED RINGERS IV SOLN
INTRAVENOUS | Status: DC
  Administered 2018-07-24: 11:00:00 via INTRAVENOUS

## 2018-07-24 MED ORDER — PROPOFOL 10 MG/ML IV BOLUS
INTRAVENOUS | Status: AC
  Filled 2018-07-24: qty 20

## 2018-07-24 MED ORDER — DIPHENHYDRAMINE HCL 50 MG/ML IJ SOLN
INTRAMUSCULAR | Status: DC | PRN
  Administered 2018-07-24: 25 mg via INTRAVENOUS

## 2018-07-24 MED ORDER — LIDOCAINE 2% (20 MG/ML) 5 ML SYRINGE
INTRAMUSCULAR | Status: AC
  Filled 2018-07-24: qty 5

## 2018-07-24 MED ORDER — LIDOCAINE HCL (PF) 2 % IJ SOLN
INTRAMUSCULAR | Status: AC
  Filled 2018-07-24: qty 10

## 2018-07-24 MED ORDER — PROPOFOL 10 MG/ML IV BOLUS
INTRAVENOUS | Status: DC | PRN
  Administered 2018-07-24 (×2): 20 mg via INTRAVENOUS

## 2018-07-24 MED ORDER — FENTANYL CITRATE (PF) 250 MCG/5ML IJ SOLN
INTRAMUSCULAR | Status: AC
  Filled 2018-07-24: qty 5

## 2018-07-24 SURGICAL SUPPLY — 39 items
BLADE SURG 15 STRL LF DISP TIS (BLADE) ×1 IMPLANT
BLADE SURG 15 STRL SS (BLADE) ×2
CHLORAPREP W/TINT 10.5 ML (MISCELLANEOUS) ×3 IMPLANT
CLOSURE WOUND 1/2 X4 (GAUZE/BANDAGES/DRESSINGS) ×1
CONT SPEC 4OZ CLIKSEAL STRL BL (MISCELLANEOUS) ×3 IMPLANT
COVER SURGICAL LIGHT HANDLE (MISCELLANEOUS) ×3 IMPLANT
DERMABOND ADVANCED (GAUZE/BANDAGES/DRESSINGS) ×2
DERMABOND ADVANCED .7 DNX12 (GAUZE/BANDAGES/DRESSINGS) ×1 IMPLANT
DRAPE LAPAROTOMY 100X72 PEDS (DRAPES) ×3 IMPLANT
DRAPE UTILITY XL STRL (DRAPES) ×3 IMPLANT
DRSG TEGADERM 4X4.75 (GAUZE/BANDAGES/DRESSINGS) ×3 IMPLANT
DRSG TELFA 3X8 NADH (GAUZE/BANDAGES/DRESSINGS) ×3 IMPLANT
ELECT CAUTERY BLADE 6.4 (BLADE) ×3 IMPLANT
ELECT REM PT RETURN 9FT ADLT (ELECTROSURGICAL) ×3
ELECTRODE REM PT RTRN 9FT ADLT (ELECTROSURGICAL) ×1 IMPLANT
FILTER STRAW FLUID ASPIR (MISCELLANEOUS) ×3 IMPLANT
GAUZE 4X4 16PLY RFD (DISPOSABLE) ×3 IMPLANT
GLOVE BIOGEL PI IND STRL 8 (GLOVE) ×1 IMPLANT
GLOVE BIOGEL PI INDICATOR 8 (GLOVE) ×2
GLOVE ECLIPSE 7.5 STRL STRAW (GLOVE) ×3 IMPLANT
GOWN STRL REUS W/ TWL LRG LVL3 (GOWN DISPOSABLE) ×2 IMPLANT
GOWN STRL REUS W/TWL LRG LVL3 (GOWN DISPOSABLE) ×4
KIT BASIN OR (CUSTOM PROCEDURE TRAY) ×3 IMPLANT
KIT TURNOVER KIT B (KITS) ×3 IMPLANT
NEEDLE HYPO 25GX1X1/2 BEV (NEEDLE) ×3 IMPLANT
NS IRRIG 1000ML POUR BTL (IV SOLUTION) ×3 IMPLANT
PACK SURGICAL SETUP 50X90 (CUSTOM PROCEDURE TRAY) ×3 IMPLANT
PAD ARMBOARD 7.5X6 YLW CONV (MISCELLANEOUS) ×3 IMPLANT
PENCIL BUTTON HOLSTER BLD 10FT (ELECTRODE) ×3 IMPLANT
STRIP CLOSURE SKIN 1/2X4 (GAUZE/BANDAGES/DRESSINGS) ×2 IMPLANT
SUT MNCRL AB 4-0 PS2 18 (SUTURE) ×3 IMPLANT
SUT SILK 2 0 (SUTURE)
SUT SILK 2-0 18XBRD TIE 12 (SUTURE) IMPLANT
SUT VIC AB 3-0 SH 27 (SUTURE) ×4
SUT VIC AB 3-0 SH 27X BRD (SUTURE) ×2 IMPLANT
SUT VICRYL AB 2 0 TIES (SUTURE) ×3 IMPLANT
SYR CONTROL 10ML LL (SYRINGE) ×3 IMPLANT
TOWEL OR 17X24 6PK STRL BLUE (TOWEL DISPOSABLE) ×3 IMPLANT
TOWEL OR 17X26 10 PK STRL BLUE (TOWEL DISPOSABLE) ×3 IMPLANT

## 2018-07-24 NOTE — Op Note (Signed)
OPERATIVE REPORT  DATE OF OPERATION: 07/24/2018  PATIENT:  Kayla Adkins  37 y.o. female  PRE-OPERATIVE DIAGNOSIS:  Rhabdomyolysis  POST-OPERATIVE DIAGNOSIS:  Rhabdomyolysis  INDICATION(S) FOR OPERATION:  Patient with rhabdomyolysis of unknown etiology  FINDINGS: No apparent inflammatory changes  PROCEDURE:  Procedure(s): RIGHT MUSCLE BIOPSY THIGH  SURGEON:  Surgeon(s): Judeth Horn, MD  ASSISTANT: None  ANESTHESIA:   local and MAC  COMPLICATIONS: None  EBL: Less than 10 ml  BLOOD ADMINISTERED: none  DRAINS: none   SPECIMEN:  Source of Specimen:  2 x 2 cm segment of right thigh quadriceps muscle.  COUNTS CORRECT:  YES  PROCEDURE DETAILS: The patient was taken to the operating room and placed on table in supine position.  After an adequate amount of IV sedation was given, her right thigh was prepped and draped in usual sterile manner.  A longitudinal injection of lidocaine was given 2% without epinephrine into the subcutaneous tissue on the right side.  We then using a 15 blade to make an incision in the skin down to subcutaneous tissue.  With self-retaining retractors in place and electrocautery we were able to isolate out the fascia of the quadriceps muscle and we incised in the fascia using a 15 blade.  We isolated a segment of muscle measuring approximately 2-1/2 x 2-1/2 cm in size and coapted on the side with a hemostat clamp and resected with a 15 blade.  We tied out behind the skin with 2-0 Vicryl.  We sent the specimen off.  We closed the fascia using running 3-0 Vicryl suture and then closed the skin using running subcuticular stitch of 4-0 Monocryl.  All needle counts, sponge counts, and instrument counts were correct. Dermabond, Steri-Strips, and Tegaderms were used to complete the dressing.  PATIENT DISPOSITION:  PACU - hemodynamically stable.   Judeth Horn 8/22/20191:52 PM

## 2018-07-24 NOTE — Progress Notes (Signed)
[facility] PROGRESS NOTE  Kayla Adkins FMB:[id number] DOB: Jan 09, 1981 DOA: 07/16/2018 PCP: Patient, No Pcp Per   Narrative: 37 year old female with essentially no medical illnesses admitted by myself on 8/14 with persisting fever nausea vomiting after having been treated in the ED on 8/12 for viral illness And found to have slightly elevated LFTs pressed white count, rhabdomyolysis above 50,000 uncomfortable abdomen   A & Plan Fever query cause-DDX rhabdomyolysis versus viral illness-discussed with Dr. Johnnye Adkins on 8/15 obtaining coxsackie , parvovirus, Epstein-Barr and CMV-BC X2 as below Blood cult was neg-d/c zosyn cxr neg  Rhabdomyolysis #dermatomyositis or polymyositis however no pathognomic rash-CK tomorrow -D/w Dr. Jimmy Adkins of nephrology-not much else would do-Rhabdo could take upto 1-2 weeks to resolve  Antiro neg  anti-la neg tsh neg Aldolase neg Starting empiric steroids Solu-Medrol 8/18 -Improved somewhat on steroids-p.o. prednisone current dose Getting muscle biopsy --shave general surgery input-TSH normal-CK now down to 7000 Dr. Kathlene November available at phone number [phone] to discuss down titration of steroids and went to do the same in terms of CK-he had a long discussion with me on 8/22 and the best treatment at this juncture would be Solu-Medrol 1 mg/kg, await CK below 5000-please ensure that muscle biopsy results are forwarded to him-I have sent him yesterday's progress note with regards to Kayla Adkins patient presentation and he will arrange follow-up in the outpatient setting when patient is closer to discharge  Transaminitis-2/2 Rhabdo--no further work-up as bili and alk phone nl  Severe hypokalemia likely secondary to sweating and rhabdo-IV fluids 8/21 at 50 cc/H continue the same  Thrombocytopenia is relative but reactive  Hypertension continue metoprolol   DVT prophylaxis: lovenoex code Status: Full  family Communication:   none Disposition Plan: Not ready for  discharge CK needs to be durably below 5000 prior to a discussion about discharge patient understands  Kayla Au, MD  [facility] Direct contact: [phone] --Via amion app OR  --[email or web address]; password [email or web address]  7PM-7AM contact night coverage as above 07/24/2018, 5:18 PM  LOS: 8 days   Consultants:  Phone consulted ID  Telephone consulted renal  Dr Kayla November telephone consulted 8/22  General surgery consult for biopsy  Procedures:  T abdomen pelvis  Antimicrobials:  Zosyn 8/15 a.m.  Interval history/Subjective:  Just back from biopsy No new issues Passing good urine No fever no chills   Objective:  Vitals:  Vitals:   07/24/18 1435 07/24/18 1458  BP:  (!) 143/93  Pulse:  73  Resp:  17  Temp: 97.6 F (36.4 C) 99 F (37.2 C)  SpO2:  100%    Exam:  Pleasant Chest clear S1-S2   I have personally reviewed the following:   Labs:  Blood culture no growth since 8/15  Imaging studies:  CT abdomen pelvis as above  Medical tests:  No  Test discussed with performing physician:  No  Decision to obtain old records:  Yes  Review and summation of old records:  Yes  Scheduled Meds: . methylPREDNISolone (SOLU-MEDROL) injection  30 mg Intravenous Q12H  . metoprolol tartrate  25 mg Oral BID  . traMADol      . traMADol  50 mg Oral Q6H   Continuous Infusions: . sodium chloride Stopped (07/20/18 1015)  . lactated ringers Stopped (07/24/18 1500)  . sodium chloride 0.45 % 1,000 mL with sodium bicarbonate 100 mEq infusion 50 mL/hr at 07/24/18 1659    Active Problems:   Rhabdomyolysis   LOS: 8 days

## 2018-07-24 NOTE — Progress Notes (Signed)
Patient ID: Kirtland BouchardVaronica Chandonnet, female   DOB: June 29, 1981, 37 y.o.   MRN: [id number]       Subjective: No new complaints.  Feels well.  Ready to go home.  Objective: Vital signs in last 24 hours: Temp:  [98.5 F (36.9 C)-98.9 F (37.2 C)] 98.5 F (36.9 C) (08/22 0553) Pulse Rate:  [81-92] 81 (08/22 0553) Resp:  [18] 18 (08/22 0553) BP: (138-148)/(89-101) 147/93 (08/22 0553) SpO2:  [98 %-100 %] 100 % (08/22 0553) Last BM Date: 07/23/18  Intake/Output from previous day: 08/21 0701 - 08/22 0700 In: 774.2 [P.O.:480; I.V.:294.2] Out: -  Intake/Output this shift: Total I/O In: 607.5 [I.V.:607.5] Out: -   PE: Heart: regular Lungs: CTAB Abd: soft, NT, ND, +BS  Lab Results:  Recent Labs    07/23/18 0507 07/24/18 0619  WBC 9.1 8.7  HGB 11.4* 11.4*  HCT 35.2* 34.6*  PLT 313 357   BMET Recent Labs    07/23/18 0507 07/24/18 0619  NA 138 139  K 3.5 3.1*  CL 105 105  CO2 28 28  GLUCOSE 87 100*  BUN 8 12  CREATININE 0.56 0.61  CALCIUM 8.1* 8.3*   PT/INR No results for input(s): LABPROT, INR in the last 72 hours. CMP     Component Value Date/Time   NA 139 07/24/2018 0619   K 3.1 (L) 07/24/2018 0619   CL 105 07/24/2018 0619   CO2 28 07/24/2018 0619   GLUCOSE 100 (H) 07/24/2018 0619   BUN 12 07/24/2018 0619   CREATININE 0.61 07/24/2018 0619   CALCIUM 8.3 (L) 07/24/2018 0619   PROT 5.7 (L) 07/24/2018 0619   ALBUMIN 2.7 (L) 07/24/2018 0619   AST 297 (H) 07/24/2018 0619   ALT 218 (H) 07/24/2018 0619   ALKPHOS 46 07/24/2018 0619   BILITOT 0.2 (L) 07/24/2018 0619   GFRNONAA >60 07/24/2018 0619   GFRAA >60 07/24/2018 0619   Lipase     Component Value Date/Time   LIPASE 54 (H) 07/16/2018 1144       Studies/Results: No results found.  Anti-infectives: Anti-infectives (From admission, onward)   Start     Dose/Rate Route Frequency Ordered Stop   07/17/18 0715  piperacillin-tazobactam (ZOSYN) IVPB 3.375 g  Status:  Discontinued     3.375 g 12.5 mL/hr over  240 Minutes Intravenous Every 8 hours 07/17/18 0649 07/19/18 1325       Assessment/Plan  Rhabdomyolysis -will plan for muscle biopsy later today.  Can DC home after that if she feels able and if that's what primary service is wanting to do.   FEN - NPO VTE - SCDs ID - ancef on call to or   LOS: 8 days    Letha CapeKelly E Libero Puthoff , [facility] 07/24/2018, 9:05 AM Pager: [phone]

## 2018-07-24 NOTE — Transfer of Care (Signed)
Immediate Anesthesia Transfer of Care Note  Patient: Kayla Adkins  Procedure(s) Performed: MUSCLE BIOPSY THIGH (Right Thigh)  Patient Location: PACU  Anesthesia Type:MAC  Level of Consciousness: drowsy  Airway & Oxygen Therapy: Patient Spontanous Breathing and Patient connected to nasal cannula oxygen  Post-op Assessment: Report given to RN and Post -op Vital signs reviewed and stable  Post vital signs: Reviewed and stable  Last Vitals:  Vitals Value Taken Time  BP 125/93 07/24/2018  1:50 PM  Temp    Pulse 80 07/24/2018  1:55 PM  Resp 14 07/24/2018  1:55 PM  SpO2 100 % 07/24/2018  1:55 PM  Vitals shown include unvalidated device data.  Last Pain:  Vitals:   07/24/18 0800  TempSrc:   PainSc: 0-No pain      Patients Stated Pain Goal: 5 (48/18/56 3149)  Complications: No apparent anesthesia complications

## 2018-07-24 NOTE — Anesthesia Procedure Notes (Signed)
Procedure Name: MAC Performed by: Valda Favia, CRNA Pre-anesthesia Checklist: Patient identified, Emergency Drugs available, Suction available, Patient being monitored and Timeout performed Oxygen Delivery Method: Nasal cannula Induction Type: IV induction Placement Confirmation: positive ETCO2 Dental Injury: Teeth and Oropharynx as per pre-operative assessment

## 2018-07-24 NOTE — Anesthesia Preprocedure Evaluation (Addendum)
Anesthesia Evaluation  Patient identified by MRN, date of birth, ID band Patient awake    Reviewed: Allergy & Precautions, NPO status , Patient's Chart, lab work & pertinent test results  Airway Mallampati: III  TM Distance: >3 FB Neck ROM: Full    Dental no notable dental hx.    Pulmonary Current Smoker,    Pulmonary exam normal breath sounds clear to auscultation       Cardiovascular hypertension, Normal cardiovascular exam Rhythm:Regular Rate:Normal  ECG: SR, rate 77   Neuro/Psych Vertigo negative psych ROS   GI/Hepatic negative GI ROS, Neg liver ROS,   Endo/Other  negative endocrine ROS  Renal/GU negative Renal ROS     Musculoskeletal negative musculoskeletal ROS (+)   Abdominal   Peds  Hematology  (+) anemia ,   Anesthesia Other Findings Rhabdomyolysis  Reproductive/Obstetrics hcg negative                            Anesthesia Physical Anesthesia Plan  ASA: II  Anesthesia Plan: MAC   Post-op Pain Management:    Induction: Intravenous  PONV Risk Score and Plan: 2 and Midazolam, Dexamethasone, Ondansetron, Treatment may vary due to age or medical condition and Propofol infusion  Airway Management Planned: Simple Face Mask  Additional Equipment:   Intra-op Plan:   Post-operative Plan:   Informed Consent: I have reviewed the patients History and Physical, chart, labs and discussed the procedure including the risks, benefits and alternatives for the proposed anesthesia with the patient or authorized representative who has indicated his/her understanding and acceptance.   Dental advisory given  Plan Discussed with: CRNA  Anesthesia Plan Comments:      Anesthesia Quick Evaluation

## 2018-07-24 NOTE — Anesthesia Postprocedure Evaluation (Signed)
Anesthesia Post Note  Patient: Kayla Adkins  Procedure(s) Performed: MUSCLE BIOPSY THIGH (Right Thigh)     Patient location during evaluation: PACU Anesthesia Type: MAC Level of consciousness: awake and alert Pain management: pain level controlled Vital Signs Assessment: post-procedure vital signs reviewed and stable Respiratory status: spontaneous breathing, nonlabored ventilation, respiratory function stable and patient connected to nasal cannula oxygen Cardiovascular status: stable and blood pressure returned to baseline Postop Assessment: no apparent nausea or vomiting Anesthetic complications: no    Last Vitals:  Vitals:   07/24/18 1435 07/24/18 1458  BP:  (!) 143/93  Pulse:  73  Resp:  17  Temp: 36.4 C 37.2 C  SpO2:  100%    Last Pain:  Vitals:   07/24/18 1458  TempSrc: Oral  PainSc:                  Karyl Kinnier Ellender

## 2018-07-25 ENCOUNTER — Encounter (HOSPITAL_COMMUNITY): Payer: Self-pay | Admitting: General Surgery

## 2018-07-25 LAB — CK: CK TOTAL: 5633 U/L — AB (ref 38–234)

## 2018-07-25 MED ORDER — HYDRALAZINE HCL 20 MG/ML IJ SOLN
5.0000 mg | Freq: Four times a day (QID) | INTRAMUSCULAR | Status: DC | PRN
  Administered 2018-07-25: 5 mg via INTRAVENOUS

## 2018-07-25 MED ORDER — PREDNISONE 50 MG PO TABS
60.0000 mg | ORAL_TABLET | Freq: Every day | ORAL | Status: DC
  Administered 2018-07-26: 60 mg via ORAL
  Filled 2018-07-25: qty 1

## 2018-07-25 NOTE — Progress Notes (Signed)
PROGRESS NOTE    Kayla Adkins  TWK:[id number] DOB: [birth year] DOA: 07/16/2018 PCP: Patient, No Pcp Per    Brief Narrative:  37 year old female with essentially no medical illnesses admitted by myself on 8/14 with persisting fever nausea vomiting after having been treated in the ED on 8/12 for viral illness And found to have slightly elevated LFTs pressed white count, rhabdomyolysis above 50,000 uncomfortable abdomen  Assessment & Plan:   Active Problems:   Rhabdomyolysis   Fever: Discussed with Dr. Johnnye Sima on 8/15 who recommended coxsackie A (+IgG, but negative IgM), positive coxsackie B.  Negative CMV igM (positive IgG), negative parvovirus, negative EBV IgM (positive IgG), negative acute hepatitis panel.   Will discuss results of above with ID tomorrow   Abx have been d/c'd  Rhabdomyolysis #dermatomyositis or polymyositis however no pathognomic rash.  Unclear cause.   -Previous provider d/w Dr. Jimmy Footman of nephrology- "not much else would do-Rhabdo could take upto 1-2 weeks to resolve" Elevated esr, crp TSH neg Aldolase neg Anti DNA negative Anti jo negative Anti scleroderma antibody negative Starting empiric steroids Solu-Medrol 8/18 - discussed with Dr. Kathlene November today and planning for 60 mg prednisone daily with outpatient follow up. S/p biopsy by general surgery Per previous provider -> Dr. Kathlene November available at phone number [phone] to discuss down titration of steroids and went to do the same in terms of CK-he had a long discussion with me on 8/22 and the best treatment at this juncture would be Solu-Medrol 1 mg/kg, await CK below 5000-please ensure that muscle biopsy results are forwarded to him-I have sent him yesterday's progress note with regards to [facility] patient presentation and he will arrange follow-up in the outpatient setting when patient is closer to discharge  Transaminitis-2/2 Rhabdo--no further work-up as bili and alk phone nl, follow in AM  Severe  hypokalemia likely secondary to sweating and rhabdo - follow repeat labs  Thrombocytopenia improved  Hypertension continue metoprolol, follow   DVT prophylaxis: SCD Code Status: full  Family Communication: none at bedside Disposition Plan: pending  Consultants:   General surgery  rheumatology  Procedures:  Right muscle biopsy thigh on 8/22  Antimicrobials:  Anti-infectives (From admission, onward)   Start     Dose/Rate Route Frequency Ordered Stop   07/24/18 0915  ceFAZolin (ANCEF) IVPB 2g/100 mL premix     2 g 200 mL/hr over 30 Minutes Intravenous On call to O.R. 07/24/18 0909 07/24/18 1304   07/17/18 0715  piperacillin-tazobactam (ZOSYN) IVPB 3.375 g  Status:  Discontinued     3.375 g 12.5 mL/hr over 240 Minutes Intravenous Every 8 hours 07/17/18 0649 07/19/18 1325     Subjective: Feeling well. No muscle aches. Had some LE stiffness yesterday.  Objective: Vitals:   07/25/18 0547 07/25/18 0948 07/25/18 1445 07/25/18 2134  BP: 136/83 (!) 152/92 (!) 143/93 (!) 169/108  Pulse: 69 65 73 73  Resp: '18 18 18   ' Temp: 98.4 F (36.9 C) 98.2 F (36.8 C) 98.4 F (36.9 C)   TempSrc: Oral Oral Oral   SpO2: 100% 100% 100%   Weight:      Height:        Intake/Output Summary (Last 24 hours) at 07/25/2018 2138 Last data filed at 07/25/2018 1952 Gross per 24 hour  Intake 1992.34 ml  Output -  Net 1992.34 ml   Filed Weights   07/16/18 0946 07/16/18 1752 07/24/18 1124  Weight: 64.4 kg 64.4 kg 64.4 kg    Examination:  General exam: Appears calm and comfortable  Respiratory system: Clear to auscultation. Respiratory effort normal. Cardiovascular system: S1 & S2 heard, RRR. No JVD, murmurs, rubs, gallops or clicks. No pedal edema. Gastrointestinal system: Abdomen is nondistended, soft and nontender. No organomegaly or masses felt. Normal bowel sounds heard. Central nervous system: Alert and oriented. No focal neurological deficits. Extremities: Symmetric 5 x 5  power. Skin: No rashes, lesions or ulcers Psychiatry: Judgement and insight appear normal. Mood & affect appropriate.     Data Reviewed: I have personally reviewed following labs and imaging studies  CBC: Recent Labs  Lab 07/19/18 0725 07/20/18 0850 07/22/18 0649 07/23/18 0507 07/24/18 0619  WBC 2.7* 3.6* 9.4 9.1 8.7  NEUTROABS 0.8* 1.2* 5.6 4.9 4.4  HGB 12.1 12.2 12.8 11.4* 11.4*  HCT 36.7 37.2 39.5 35.2* 34.6*  MCV 92.4 93.5 95.9 93.6 93.8  PLT 167 187 256 313 709   Basic Metabolic Panel: Recent Labs  Lab 07/19/18 0725 07/20/18 0850 07/22/18 0649 07/23/18 0507 07/24/18 0619  NA 138 139 139 138 139  K 3.9 3.8 3.3* 3.5 3.1*  CL 105 107 103 105 105  CO2 '27 27 31 28 28  ' GLUCOSE 103* 97 84 87 100*  BUN 6 <5* '11 8 12  ' CREATININE 0.65 0.55 0.70 0.56 0.61  CALCIUM 8.3* 8.3* 8.4* 8.1* 8.3*   GFR: Estimated Creatinine Clearance: 90.1 mL/min (by C-G formula based on SCr of 0.61 mg/dL). Liver Function Tests: Recent Labs  Lab 07/19/18 0725 07/20/18 0850 07/22/18 0649 07/23/18 0507 07/24/18 0619  AST 1,192* 1,211* 587* 433* 297*  ALT 264* 284* 234* 219* 218*  ALKPHOS 39 38 38 39 46  BILITOT 0.4 0.4 0.4 0.4 0.2*  PROT 6.4* 6.1* 5.6* 5.6* 5.7*  ALBUMIN 2.9* 2.8* 2.7* 2.7* 2.7*   No results for input(s): LIPASE, AMYLASE in the last 168 hours. No results for input(s): AMMONIA in the last 168 hours. Coagulation Profile: No results for input(s): INR, PROTIME in the last 168 hours. Cardiac Enzymes: Recent Labs  Lab 07/20/18 0850 07/22/18 0649 07/23/18 0507 07/24/18 0619 07/25/18 0528  CKTOTAL >50,000* >50,000* 7,013* 62,836* 5,633*   BNP (last 3 results) No results for input(s): PROBNP in the last 8760 hours. HbA1C: No results for input(s): HGBA1C in the last 72 hours. CBG: No results for input(s): GLUCAP in the last 168 hours. Lipid Profile: No results for input(s): CHOL, HDL, LDLCALC, TRIG, CHOLHDL, LDLDIRECT in the last 72 hours. Thyroid Function  Tests: Recent Labs    07/23/18 0507  TSH 2.423   Anemia Panel: No results for input(s): VITAMINB12, FOLATE, FERRITIN, TIBC, IRON, RETICCTPCT in the last 72 hours. Sepsis Labs: No results for input(s): PROCALCITON, LATICACIDVEN in the last 168 hours.  Recent Results (from the past 240 hour(s))  Culture, blood (routine x 2)     Status: None   Collection Time: 07/17/18  5:20 AM  Result Value Ref Range Status   Specimen Description BLOOD LEFT ANTECUBITAL  Final   Special Requests   Final    BOTTLES DRAWN AEROBIC AND ANAEROBIC Blood Culture adequate volume   Culture   Final    NO GROWTH 5 DAYS Performed at [facility], [address], [place], [place] [address]    Report Status 07/22/2018 FINAL  Final  Culture, blood (routine x 2)     Status: None   Collection Time: 07/17/18  5:28 AM  Result Value Ref Range Status   Specimen Description BLOOD RIGHT ARM  Final   Special Requests   Final  BOTTLES DRAWN AEROBIC AND ANAEROBIC Blood Culture adequate volume   Culture   Final    NO GROWTH 5 DAYS Performed at [facility], [place] [address], [place],  [address]    Report Status 07/22/2018 FINAL  Final  Surgical pcr screen     Status: None   Collection Time: 07/24/18  9:35 AM  Result Value Ref Range Status   MRSA, PCR NEGATIVE NEGATIVE Final   Staphylococcus aureus NEGATIVE NEGATIVE Final    Comment: (NOTE) The Xpert SA Assay (FDA approved for NASAL specimens in patients 39 years of age and older), is one component of a comprehensive surveillance program. It is not intended to diagnose infection nor to guide or monitor treatment. Performed at [facility], [place] [address] [place],  [address]          Radiology Studies: No results found.      Scheduled Meds: . methylPREDNISolone (SOLU-MEDROL) injection  30 mg Intravenous Q12H  . metoprolol tartrate  25 mg Oral BID  . traMADol  50 mg Oral Q6H   Continuous Infusions: . lactated ringers  Stopped (07/24/18 1500)     LOS: 9 days    Time spent: over 30 min    Fayrene Helper, MD [facility] Pager [phone]  If 7PM-7AM, please contact night-coverage [email or web address] Password [facility] 07/25/2018, 9:38 PM

## 2018-07-25 NOTE — Progress Notes (Signed)
Patient ID: Kayla BouchardVaronica Adkins, female   DOB: 17-Aug-1981, 37 y.o.   MRN: [id number]    1 Day Post-Op  Subjective: Patient feels well today.  Hoping to go home.  CK down to 5500ish.  Objective: Vital signs in last 24 hours: Temp:  [97.5 F (36.4 C)-99 F (37.2 C)] 98.4 F (36.9 C) (08/23 0547) Pulse Rate:  [69-83] 69 (08/23 0547) Resp:  [12-18] 18 (08/23 0547) BP: (125-154)/(77-106) 136/83 (08/23 0547) SpO2:  [100 %] 100 % (08/23 0547) Weight:  [64.4 kg] 64.4 kg (08/22 1124) Last BM Date: 07/23/18  Intake/Output from previous day: 08/22 0701 - 08/23 0700 In: 2463.7 [P.O.:360; I.V.:2003.7; IV Piggyback:100] Out: 5 [Blood:5] Intake/Output this shift: No intake/output data recorded.  PE: Ext: right thigh wound is c/d/i with dermabond, steris, and tegaderm.  No evidence of erythema or infection.  Lab Results:  Recent Labs    07/23/18 0507 07/24/18 0619  WBC 9.1 8.7  HGB 11.4* 11.4*  HCT 35.2* 34.6*  PLT 313 357   BMET Recent Labs    07/23/18 0507 07/24/18 0619  NA 138 139  K 3.5 3.1*  CL 105 105  CO2 28 28  GLUCOSE 87 100*  BUN 8 12  CREATININE 0.56 0.61  CALCIUM 8.1* 8.3*   PT/INR No results for input(s): LABPROT, INR in the last 72 hours. CMP     Component Value Date/Time   NA 139 07/24/2018 0619   K 3.1 (L) 07/24/2018 0619   CL 105 07/24/2018 0619   CO2 28 07/24/2018 0619   GLUCOSE 100 (H) 07/24/2018 0619   BUN 12 07/24/2018 0619   CREATININE 0.61 07/24/2018 0619   CALCIUM 8.3 (L) 07/24/2018 0619   PROT 5.7 (L) 07/24/2018 0619   ALBUMIN 2.7 (L) 07/24/2018 0619   AST 297 (H) 07/24/2018 0619   ALT 218 (H) 07/24/2018 0619   ALKPHOS 46 07/24/2018 0619   BILITOT 0.2 (L) 07/24/2018 0619   GFRNONAA >60 07/24/2018 0619   GFRAA >60 07/24/2018 0619   Lipase     Component Value Date/Time   LIPASE 54 (H) 07/16/2018 1144       Studies/Results: No results found.  Anti-infectives: Anti-infectives (From admission, onward)   Start     Dose/Rate Route  Frequency Ordered Stop   07/24/18 0915  ceFAZolin (ANCEF) IVPB 2g/100 mL premix     2 g 200 mL/hr over 30 Minutes Intravenous On call to O.R. 07/24/18 0909 07/24/18 1304   07/17/18 0715  piperacillin-tazobactam (ZOSYN) IVPB 3.375 g  Status:  Discontinued     3.375 g 12.5 mL/hr over 240 Minutes Intravenous Every 8 hours 07/17/18 0649 07/19/18 1325       Assessment/Plan Rhabdomyolysis, POD 1, s/p muscle biopsy -site is c/d/i -discussed wound care with patient. -no heavy workouts etc for 2 weeks until that is well healed -follow up with [facility] prn  -no further surgical needs.  We will sign off.   LOS: 9 days    Letha CapeKelly E Atalaya Zappia , [facility] 07/25/2018, 8:08 AM Pager: [phone]

## 2018-07-25 NOTE — Progress Notes (Signed)
BP 169/108 HR 73 at 2134. Text paged Dr Bruna PotterBlount about high BP

## 2018-07-25 NOTE — Discharge Instructions (Signed)
May shower with dressing in place.  If the inside gets wet then remove and may place a band-aid over site. No submerging in water for 2 weeks No extensive workouts for at least 2 weeks from surgical standpoint.  You may certainly walk and go for walks as able.

## 2018-07-26 DIAGNOSIS — M6282 Rhabdomyolysis: Principal | ICD-10-CM

## 2018-07-26 LAB — COMPREHENSIVE METABOLIC PANEL
ALBUMIN: 2.8 g/dL — AB (ref 3.5–5.0)
ALK PHOS: 43 U/L (ref 38–126)
ALT: 156 U/L — ABNORMAL HIGH (ref 0–44)
ANION GAP: 6 (ref 5–15)
AST: 125 U/L — ABNORMAL HIGH (ref 15–41)
BUN: 12 mg/dL (ref 6–20)
CO2: 25 mmol/L (ref 22–32)
Calcium: 8.6 mg/dL — ABNORMAL LOW (ref 8.9–10.3)
Chloride: 105 mmol/L (ref 98–111)
Creatinine, Ser: 0.51 mg/dL (ref 0.44–1.00)
GFR calc non Af Amer: >60 mL/min (ref >60)
GLUCOSE: 116 mg/dL — AB (ref 70–99)
Potassium: 3.8 mmol/L (ref 3.5–5.1)
Sodium: 136 mmol/L (ref 135–145)
TOTAL PROTEIN: 5.7 g/dL — AB (ref 6.5–8.1)
Total Bilirubin: 0.5 mg/dL (ref 0.3–1.2)

## 2018-07-26 LAB — CBC
HEMATOCRIT: 35.1 % — AB (ref 36.0–46.0)
HEMOGLOBIN: 11.6 g/dL — AB (ref 12.0–15.0)
MCH: 31.2 pg (ref 26.0–34.0)
MCHC: 33 g/dL (ref 30.0–36.0)
MCV: 94.4 fL (ref 78.0–100.0)
Platelets: 410 10*3/uL — ABNORMAL HIGH (ref 150–400)
RBC: 3.72 MIL/uL — ABNORMAL LOW (ref 3.87–5.11)
RDW: 13.3 % (ref 11.5–15.5)
WBC: 10 10*3/uL (ref 4.0–10.5)

## 2018-07-26 LAB — MAGNESIUM: Magnesium: 2.3 mg/dL (ref 1.7–2.4)

## 2018-07-26 LAB — CK: Total CK: 5040 U/L — ABNORMAL HIGH (ref 38–234)

## 2018-07-26 MED ORDER — ACETAMINOPHEN 500 MG PO TABS: 500.0000 mg | ORAL_TABLET | Freq: Four times a day (QID) | ORAL | 0 refills | Status: DC | PRN

## 2018-07-26 MED ORDER — PREDNISONE 20 MG PO TABS: 60.0000 mg | ORAL_TABLET | Freq: Every day | ORAL | 0 refills | Status: DC

## 2018-07-26 MED ORDER — METOPROLOL TARTRATE 25 MG PO TABS: 25.0000 mg | ORAL_TABLET | Freq: Two times a day (BID) | ORAL | 0 refills | Status: DC

## 2018-07-26 MED ORDER — AMLODIPINE BESYLATE 5 MG PO TABS: 5.0000 mg | ORAL_TABLET | Freq: Every day | ORAL | 11 refills | Status: DC

## 2018-07-26 NOTE — Care Management Note (Signed)
Case Management Note  Patient Details  Name: Kayla BouchardVaronica Adkins MRN: [id number] Date of Birth: 10/29/1981  Subjective/Objective:                    Action/Plan:  Provided patient with list of offices to call and schedule a PCP follow up. [facility] No other CM needs.  Expected Discharge Date:  07/26/18               Expected Discharge Plan:  Home/Self Care  In-House Referral:     Discharge planning Services  CM Consult  Post Acute Care Choice:    Choice offered to:     DME Arranged:    DME Agency:     HH Arranged:    HH Agency:     Status of Service:  Completed, signed off  If discussed at [employer] Length of Stay Meetings, dates discussed:    Additional Comments:  Lawerance SabalDebbie Doretha Goding, RN 07/26/2018, 2:17 PM

## 2018-07-26 NOTE — Progress Notes (Signed)
Pt given list of PCP to call on Monday AM to f/u with. Pt given instructions for discharge and aware to f/u with RA MD on Monday as well. Pt Discharged home with family.

## 2018-07-26 NOTE — Discharge Summary (Signed)
Physician Discharge Summary  Kayla Adkins FKC:[id number] DOB: 07/18/1981 DOA: 07/16/2018  PCP: Patient, No Pcp Per  Admit date: 07/16/2018 Discharge date: 07/26/2018  Time spent: 40 minutes  Recommendations for Outpatient Follow-up:  1. Follow up outpatient CBC/CMP/CK 2. Follow up with rheumatology as outpatient 3. Follow up biopsy results  4. Ensure establishes with PCP 5. 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 and ate Shalla Bulluck [place] sandwich the morning of when she got sick Progressed to have anorexia only able to keep down crackers and applesauce and also high fevers with chills she still went to work on Sunday Every time she ate something heavier than crackers she would vomit Never has had similar symptoms-no exotic travel-no drug use or illicit use-no anorexiants or over-the-counter meds-no sick contacts Was sent home with Mobic and Zofran  Turned on 8/14 with worsening symptoms high fever dark-colored urine no dysuria and now diarrhea X 2 days watery in consistency, no blurred vision no double vision  Characterizes abdominal pain is coming and going worsened by eating and pressure over it no other relieving factors-states it feels "woozy"-like butterflies Has Kayla Adkins mild headache right hemicranium and does feel lightheaded when getting up  She was admitted with fever and nontraumatic rhabdomyolysis with CK >50,000.  She was initially started on broad spectrum antibiotics.  She was hydrated with IVF.   Infectious workup did not reveal Kayla Adkins cause.  ESR and CRP were elevated.  Aldolase, antijo, antiscleroderma antibody, and antiDNA antibody were negative.  She was started on empiric steroids.  Biopsy was obtained with general surgery assistance.  Her CK improved and she was discharged with 60 mg prednisone daily to follow up with Dr. Kathlene November.    [facility] Course:  Rhabdomyolysis: suspected inflammatory myositis, but unclear cause.  Consider dermatomyositis? Polymyositis? Elevated esr, crp TSH neg Aldolase neg Anti DNA negative Anti jo negative Anti scleroderma antibody negative S/p muscle biopsy by general surgery - biopsy results pending Empiric steroids started on 8/18.  Pt CK has gradually downtrended.  Relatively stable today from yesterday, but improving still with IVF discontinued.  Will plan to discharge with 60 mg prednisone daily.  Discussed with pt importance of f/u with rheumatology and f/u for steroid taper (she's aware she needs appointment prior to completion of steroids).   Fever: Discussed with Dr. Johnnye Sima on 8/15 who recommended coxsackie Harlo Fabela (+IgG, but negative IgM), positive coxsackie B.  Negative CMV igM (positive IgG), negative parvovirus, negative EBV IgM (positive IgG), negative acute hepatitis panel.   Discussed with ID, regarding + coxsackie B, low titers, suspect suggestive of past infection Abx have been d/c'd  Transaminitis - Likely 2/2 Rhabdo.  Negative hepatitis panel.  Negative HIV.  Improving.  Follow outpatient.  Severe hypokalemia likely secondary to sweating and rhabdo - improved.  Thrombocytopenia improved  Hypertension continue metoprolol, added amlodipine at d/c  Procedures:  Right muscle biopsy thigh on 8/22  Consultations:  General surgery  Rheumatology over phone  Discharge Exam: Vitals:   07/26/18 0616 07/26/18 1044  BP: Marland Kitchen)  153/98 (!) 165/90  Pulse: 81 85  Resp: 18   Temp: 98.8 F (37.1 C)   SpO2: 100%    Pt feeling well. No muscle  aches or pain.. Wants to go home.  General: No acute distress. Cardiovascular: Heart sounds show Kayla Adkins regular rate, and rhythm. No gallops or rubs. No murmurs. No JVD. Lungs: Clear to auscultation bilaterally with good air movement. No rales, rhonchi or wheezes. Abdomen: Soft, nontender, nondistended with normal active bowel sounds. No masses. No hepatosplenomegaly. Neurological: Alert and oriented 3. Moves all extremities 4 with equal strength. Cranial nerves II through XII grossly intact. Skin: Warm and dry. No rashes or lesions. Extremities: No clubbing or cyanosis. RLE with intact dressing. Psychiatric: Mood and affect are normal. Insight and judgment are appropriate.  Discharge Instructions   Discharge Instructions    Call MD for:  difficulty breathing, headache or visual disturbances   Complete by:  As directed    Call MD for:  extreme fatigue   Complete by:  As directed    Call MD for:  persistant dizziness or light-headedness   Complete by:  As directed    Call MD for:  persistant nausea and vomiting   Complete by:  As directed    Call MD for:  redness, tenderness, or signs of infection (pain, swelling, redness, odor or green/yellow discharge around incision site)   Complete by:  As directed    Call MD for:  severe uncontrolled pain   Complete by:  As directed    Call MD for:  temperature >100.4   Complete by:  As directed    Diet - low sodium heart healthy   Complete by:  As directed    Diet - low sodium heart healthy   Complete by:  As directed    Discharge instructions   Complete by:  As directed    You were seen for rhabdomyolysis.  The cause of this is not known at this point, but you've improved with steroids.    Please follow up with Dr. Kathlene November from rheumatology as an outpatient.  This will be worked up further with rheumatology.  You'll need to follow up the results of your muscle biopsy with rheumatology as well as plans for further treatment.  We started you  on metoprolol for your blood pressure.  We'll also start amlodipine at discharge.  It's extremely important that you follow up with Kayla Adkins primary care provider to establish care and also to follow up this hospitalization.  Return for new, recurrent, or worsening symptoms.  Please ask your PCP to request records from this hospitalization so they know what was done and what the next steps will be.   Increase activity slowly   Complete by:  As directed    Increase activity slowly   Complete by:  As directed      Allergies as of 07/26/2018   No Known Allergies     Medication List    STOP taking these medications   ibuprofen 600 MG tablet Commonly known as:  ADVIL,MOTRIN   meloxicam 7.5 MG tablet Commonly known as:  MOBIC     TAKE these medications   acetaminophen 500 MG tablet Commonly known as:  TYLENOL Take 1 tablet (500 mg total) by mouth every 6 (six) hours as needed for mild pain. What changed:  how much to take   amLODipine 5 MG tablet Commonly known as:  NORVASC Take 1 tablet (5 mg total) by mouth daily.   cyclobenzaprine 10  MG tablet Commonly known as:  FLEXERIL Take 1 tablet (10 mg total) by mouth 2 (two) times daily as needed for muscle spasms.   HYDROcodone-acetaminophen 5-325 MG tablet Commonly known as:  NORCO/VICODIN Take 1 tablet by mouth every 4 (four) hours as needed.   meclizine 25 MG tablet Commonly known as:  ANTIVERT Take 1 tablet (25 mg total) by mouth 3 (three) times daily as needed for dizziness.   metoprolol tartrate 25 MG tablet Commonly known as:  LOPRESSOR Take 1 tablet (25 mg total) by mouth 2 (two) times daily.   ondansetron 4 MG disintegrating tablet Commonly known as:  ZOFRAN-ODT Take 1 tablet (4 mg total) by mouth every 8 (eight) hours as needed for nausea.   ondansetron 4 MG tablet Commonly known as:  ZOFRAN Take 1 tablet (4 mg total) by mouth every 6 (six) hours.   predniSONE 20 MG tablet Commonly known as:  DELTASONE Take 3  tablets (60 mg total) by mouth daily with breakfast for 14 days. (please follow up with rheumatology to discuss Kayla Adkins taper) Start taking on:  07/27/2018      No Known Allergies Follow-up Information    Judeth Horn, MD Follow up.   Specialty:  General Surgery Why:  call as needed for questions about your muscle biopsy site Contact information: [address] [place] [address] [phone]        Lahoma Rocker, MD Follow up.   Specialty:  Rheumatology Why:  Please call regarding an appointment  Contact information: [address] [place] [address] [phone]            The results of significant diagnostics from this hospitalization (including imaging, microbiology, ancillary and laboratory) are listed below for reference.    Significant Diagnostic Studies: Dg Chest 2 View  Result Date: 07/16/2018 CLINICAL DATA:  Persistent fever. EXAM: CHEST - 2 VIEW COMPARISON:  05/23/2015 FINDINGS: The heart size and mediastinal contours are within normal limits. Both lungs are clear. The visualized skeletal structures are unremarkable. IMPRESSION: Normal exam. Electronically Signed   By: Lorriane Shire M.D.   On: 07/16/2018 11:57   Ct Abdomen Pelvis W Contrast  Result Date: 07/16/2018 CLINICAL DATA:  Acute abdominal pain with fever EXAM: CT ABDOMEN AND PELVIS WITH CONTRAST TECHNIQUE: Multidetector CT imaging of the abdomen and pelvis was performed using the standard protocol following bolus administration of intravenous contrast. CONTRAST:  17m OMNIPAQUE IOHEXOL 300 MG/ML  SOLN COMPARISON:  None. FINDINGS: LOWER CHEST: No basilar pulmonary nodules or pleural effusion. No apical pericardial effusion. HEPATOBILIARY: Normal hepatic contours and density. No intra- or extrahepatic biliary dilatation. Normal gallbladder. PANCREAS: Normal parenchymal contours without ductal dilatation. No peripancreatic fluid collection. SPLEEN: Normal. ADRENALS/URINARY TRACT:  --Adrenal glands: Normal. --Right kidney/ureter: No hydronephrosis, nephroureterolithiasis, perinephric stranding or solid renal mass. --Left kidney/ureter: No hydronephrosis, nephroureterolithiasis, perinephric stranding or solid renal mass. --Urinary bladder: Normal for degree of distention STOMACH/BOWEL: --Stomach/Duodenum: No hiatal hernia or other gastric abnormality. Normal duodenal course. --Small bowel: No dilatation or inflammation. --Colon: No focal abnormality. --Appendix: Normal. VASCULAR/LYMPHATIC: Normal course and caliber of the major abdominal vessels. No abdominal or pelvic lymphadenopathy. REPRODUCTIVE: Normal uterus and ovaries. MUSCULOSKELETAL. No bony spinal canal stenosis or focal osseous abnormality. OTHER: None. IMPRESSION: No acute abnormality of the abdomen or pelvis. Electronically Signed   By: KUlyses JarredM.D.   On: 07/16/2018 18:05    Microbiology: Recent Results (from the past 240 hour(s))  Culture, blood (routine x 2)  Status: None   Collection Time: 07/17/18  5:20 AM  Result Value Ref Range Status   Specimen Description BLOOD LEFT ANTECUBITAL  Final   Special Requests   Final    BOTTLES DRAWN AEROBIC AND ANAEROBIC Blood Culture adequate volume   Culture   Final    NO GROWTH 5 DAYS Performed at [facility], [address], [place], [place] [address]    Report Status 07/22/2018 FINAL  Final  Culture, blood (routine x 2)     Status: None   Collection Time: 07/17/18  5:28 AM  Result Value Ref Range Status   Specimen Description BLOOD RIGHT ARM  Final   Special Requests   Final    BOTTLES DRAWN AEROBIC AND ANAEROBIC Blood Culture adequate volume   Culture   Final    NO GROWTH 5 DAYS Performed at [facility], [place] [address], [place], [place] [address]    Report Status 07/22/2018 FINAL  Final  Surgical pcr screen     Status: None   Collection Time: 07/24/18  9:35 AM  Result Value Ref Range Status   MRSA, PCR NEGATIVE NEGATIVE Final    Staphylococcus aureus NEGATIVE NEGATIVE Final    Comment: (NOTE) The Xpert SA Assay (FDA approved for NASAL specimens in patients 40 years of age and older), is one component of Isaah Furry comprehensive surveillance program. It is not intended to diagnose infection nor to guide or monitor treatment. Performed at [facility], [place] [address] [place], [place] [address]      Labs: Basic Metabolic Panel: Recent Labs  Lab 07/20/18 0850 07/22/18 0649 07/23/18 0507 07/24/18 0619 07/26/18 0447  NA 139 139 138 139 136  K 3.8 3.3* 3.5 3.1* 3.8  CL 107 103 105 105 105  CO2 '27 31 28 28 25  ' GLUCOSE 97 84 87 100* 116*  BUN <5* '11 8 12 12  ' CREATININE 0.55 0.70 0.56 0.61 0.51  CALCIUM 8.3* 8.4* 8.1* 8.3* 8.6*  MG  --   --   --   --  2.3   Liver Function Tests: Recent Labs  Lab 07/20/18 0850 07/22/18 0649 07/23/18 0507 07/24/18 0619 07/26/18 0447  AST 1,211* 587* 433* 297* 125*  ALT 284* 234* 219* 218* 156*  ALKPHOS 38 38 39 46 43  BILITOT 0.4 0.4 0.4 0.2* 0.5  PROT 6.1* 5.6* 5.6* 5.7* 5.7*  ALBUMIN 2.8* 2.7* 2.7* 2.7* 2.8*   No results for input(s): LIPASE, AMYLASE in the last 168 hours. No results for input(s): AMMONIA in the last 168 hours. CBC: Recent Labs  Lab 07/20/18 0850 07/22/18 0649 07/23/18 0507 07/24/18 0619 07/26/18 0447  WBC 3.6* 9.4 9.1 8.7 10.0  NEUTROABS 1.2* 5.6 4.9 4.4  --   HGB 12.2 12.8 11.4* 11.4* 11.6*  HCT 37.2 39.5 35.2* 34.6* 35.1*  MCV 93.5 95.9 93.6 93.8 94.4  PLT 187 256 313 357 410*   Cardiac Enzymes: Recent Labs  Lab 07/22/18 0649 07/23/18 0507 07/24/18 0619 07/25/18 0528 07/26/18 0447  CKTOTAL >50,000* 7,013* 46,503* 5,633* 5,040*   BNP: BNP (last 3 results) No results for input(s): BNP in the last 8760 hours.  ProBNP (last 3 results) No results for input(s): PROBNP in the last 8760 hours.  CBG: No results for input(s): GLUCAP in the last 168 hours.     Signed:  Fayrene Helper MD.  [facility] 07/26/2018,  2:16 PM

## 2018-08-05 ENCOUNTER — Other Ambulatory Visit: Payer: Self-pay

## 2018-08-05 ENCOUNTER — Ambulatory Visit (INDEPENDENT_AMBULATORY_CARE_PROVIDER_SITE_OTHER): Payer: Self-pay | Admitting: Internal Medicine

## 2018-08-05 VITALS — BP 150/84 | HR 83 | Temp 98.9°F | Ht 66.0 in | Wt 187.4 lb

## 2018-08-05 DIAGNOSIS — Z7952 Long term (current) use of systemic steroids: Secondary | ICD-10-CM

## 2018-08-05 DIAGNOSIS — R42 Dizziness and giddiness: Secondary | ICD-10-CM | POA: Insufficient documentation

## 2018-08-05 DIAGNOSIS — M6282 Rhabdomyolysis: Secondary | ICD-10-CM

## 2018-08-05 DIAGNOSIS — I10 Essential (primary) hypertension: Secondary | ICD-10-CM | POA: Insufficient documentation

## 2018-08-05 MED ORDER — PREDNISONE 50 MG PO TABS: 50.0000 mg | ORAL_TABLET | Freq: Every day | ORAL | 0 refills | Status: AC

## 2018-08-05 NOTE — Assessment & Plan Note (Signed)
Assessment: Ms. Aase presents after admission of fever and non-traumatic rhabdomyolysis with CK >50,000. Differential diagnosis includes an inflammatory myositis although she had negative Myositis-specific autoantibodies and myositis-associated autoantibodies. It is important to note that these autoantibodies occur in approximately 30% of cases and thus cannot rule out inflammatory myopathies such as dermatomyositis or polymyositis. She also may have had a viral myositis given that she had viral-like symptoms prior to her admission (fevers and diarrhea). Her infectious disease work up however was unremarkable. She is currently on Prednisone 60 mg daily which does seem to be improving her symptoms. I would like to continue tapering her steroid dose throughout the next 2 weeks.   Plan: - Ordered CK and BMP today - According to the patient, she has a follow-up appointment with Rheumatology on August 19, 2018 - Decrease her prednisone dose to 50 mg daily for the next 7 days. She will then follow-up in our clinic. If she continues to improve, we will consider tapering her prednisone to 40 mg daily until she follows up with her Rheumatologist.

## 2018-08-05 NOTE — Patient Instructions (Addendum)
I will call to up date you on your blood test results.  Please start taking your new prescription of prednisone 50 mg every day starting tomorrow for 1 week. Please make a follow-up appointment so that we may consider lowering your prednisone dose even further.
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
## 2018-08-06 ENCOUNTER — Other Ambulatory Visit: Payer: Self-pay | Admitting: Internal Medicine

## 2018-08-06 LAB — BMP8+ANION GAP
Anion Gap: 14 mmol/L (ref 10.0–18.0)
BUN / CREAT RATIO: 18 (ref 9–23)
BUN: 10 mg/dL (ref 6–20)
CO2: 26 mmol/L (ref 20–29)
CREATININE: 0.57 mg/dL (ref 0.57–1.00)
Calcium: 9.6 mg/dL (ref 8.7–10.2)
Chloride: 101 mmol/L (ref 96–106)
GFR calc Af Amer: 137 mL/min/{1.73_m2} (ref >59)
GFR calc non Af Amer: 119 mL/min/{1.73_m2} (ref >59)
Glucose: 116 mg/dL — ABNORMAL HIGH (ref 65–99)
Potassium: 4.3 mmol/L (ref 3.5–5.2)
Sodium: 141 mmol/L (ref 134–144)

## 2018-08-06 LAB — CK: Total CK: 511 U/L (ref 24–173)

## 2018-08-06 NOTE — Telephone Encounter (Signed)
Refill Request- Pt seen yesterday and checked her pharmacy this morning and her refill for the   predniSONE (DELTASONE) 50 MG tablet is not there.

## 2018-08-06 NOTE — Progress Notes (Signed)
I called patient to inform her of the results. We will see her back in clinic next week to continue titrating down her prednisone.

## 2018-08-06 NOTE — Progress Notes (Signed)
Internal Medicine Clinic Attending  I saw and evaluated the patient.  I personally confirmed the key portions of the history and exam documented by Dr. Prince and I reviewed pertinent patient test results.  The assessment, diagnosis, and plan were formulated together and I agree with the documentation in the resident's note.  

## 2018-08-06 NOTE — Telephone Encounter (Signed)
[facility] stated rx is ready for pt to pick up.  Called pt - stated she had talked to [facility] and they found it and has it ready for her.

## 2018-08-12 ENCOUNTER — Ambulatory Visit: Payer: Medicaid Other

## 2018-08-13 ENCOUNTER — Encounter (INDEPENDENT_AMBULATORY_CARE_PROVIDER_SITE_OTHER): Payer: Self-pay

## 2018-08-13 ENCOUNTER — Ambulatory Visit (INDEPENDENT_AMBULATORY_CARE_PROVIDER_SITE_OTHER): Payer: Self-pay | Admitting: Internal Medicine

## 2018-08-13 ENCOUNTER — Encounter (HOSPITAL_COMMUNITY): Payer: Self-pay

## 2018-08-13 VITALS — BP 129/82 | HR 86 | Temp 100.0°F | Wt 193.2 lb

## 2018-08-13 DIAGNOSIS — I10 Essential (primary) hypertension: Secondary | ICD-10-CM

## 2018-08-13 DIAGNOSIS — M6282 Rhabdomyolysis: Secondary | ICD-10-CM

## 2018-08-13 DIAGNOSIS — Z7952 Long term (current) use of systemic steroids: Secondary | ICD-10-CM

## 2018-08-13 MED ORDER — PREDNISONE 20 MG PO TABS: 40.0000 mg | ORAL_TABLET | Freq: Every day | ORAL | 0 refills | Status: DC

## 2018-08-13 MED ORDER — PREDNISONE 20 MG PO TABS: 40.0000 mg | ORAL_TABLET | Freq: Every day | ORAL | 0 refills | Status: AC

## 2018-08-13 NOTE — Assessment & Plan Note (Signed)
Assessment: Kayla Adkins is here for a follow-up of her non-traumatic rhabdomyolysis. I believe that her symptoms are likely secondary to a viral myositis given that she had viral-like symptoms prior to her admission. 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 Her symptoms (headache, muscle pain, nausea) does seem to have resolved on prednisone. I will decrease her dose today to 40 mg Prednisone with expectation that she will meet with a Rheumatology on 9/17. I will then consider tapering her steroid dose even further depending on Rheum's input.  Plan: - Recheck CK today - Change Prednisone to 40 mg QD; consider tapering even lower if symptoms do not return. - Follow-up with Rheum - Follow-up in our clinic in 2 weeks

## 2018-08-13 NOTE — Progress Notes (Signed)
   CC: Follow-up appointment for non-traumatic rhabdomyolysis.  HPI:  [name] is a 37 y.o. female with a history of HTN and non-traumatic rhabdomyolysis who presents for a follow-up appointment.  She was seen in clinic on 08/05/2018 for a hospital follow-up for non-traumatic rhabdomyolysis. At that time, her symptoms had improved and her Prednisone was titrated to 50 mg QD. [name] reports that she has had no issue with this medication. 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    Past Medical History:  Diagnosis Date  . Hypertension   . Vertigo    Review of Systems:    Review of Systems  Constitutional: Negative for chills and fever.       Weight gain  HENT: Negative for congestion and sore throat.   Respiratory: Negative for cough and shortness of breath.   Cardiovascular: Negative for chest pain and palpitations.  Gastrointestinal: Negative for abdominal pain, diarrhea, heartburn, nausea and vomiting.  Genitourinary: Negative for dysuria, hematuria and urgency.  Musculoskeletal:       No extremity pain  Skin: Negative for rash.  Neurological: Negative for dizziness, weakness and headaches.  All other systems reviewed and are negative.   Physical Exam:  Vitals:   08/13/18 1007  BP: 129/82  Pulse: 86  Temp: 100 F (37.8 C)  TempSrc: Oral  SpO2: 100%  Weight: 193 lb 3.2 oz (87.6 kg)   Physical Exam  Constitutional: She is well-developed, well-nourished, and in no distress.  HENT:  Head: Normocephalic and atraumatic.  Eyes: EOM are normal.  Neck: Normal range of motion.  Cardiovascular: Normal rate, regular rhythm and normal heart sounds.  Pulmonary/Chest: Effort normal and breath sounds normal.  Abdominal: Soft. Bowel sounds are normal.  Musculoskeletal: She exhibits no edema.  Neurological: She is alert.  Skin: Skin is warm  and dry.  Psychiatric: Affect and judgment normal.  Nursing note and vitals reviewed.   Assessment & Plan:   See Encounters Tab for problem based charting.  Patient seen with Dr. Heide Spark

## 2018-08-14 ENCOUNTER — Telehealth: Payer: Self-pay | Admitting: Internal Medicine

## 2018-08-14 LAB — CK: CK TOTAL: 164 U/L (ref 24–173)

## 2018-08-14 NOTE — Telephone Encounter (Signed)
I called Ms. Kayla Adkins to inform her of her lab results. Her CK continues to trend downward to 164 while on the prednisone. I will follow-up on recommendations from the rheumatologists.

## 2018-08-15 NOTE — Progress Notes (Signed)
Internal Medicine Clinic Attending  I saw and evaluated the patient.  I personally confirmed the key portions of the history and exam documented by Dr. Prince and I reviewed pertinent patient test results.  The assessment, diagnosis, and plan were formulated together and I agree with the documentation in the resident's note.  

## 2018-08-28 ENCOUNTER — Encounter (HOSPITAL_COMMUNITY): Payer: Self-pay

## 2018-08-29 ENCOUNTER — Telehealth: Payer: Self-pay

## 2018-08-29 NOTE — Telephone Encounter (Signed)
Requesting a refill on prednisone @   [id number] [place], [place] - [address] AT [facility] [address] [phone] (Phone) [phone] (Fax)

## 2018-09-01 ENCOUNTER — Ambulatory Visit: Payer: Medicaid Other

## 2018-09-01 ENCOUNTER — Other Ambulatory Visit: Payer: Self-pay | Admitting: Internal Medicine

## 2018-09-01 DIAGNOSIS — M6282 Rhabdomyolysis: Secondary | ICD-10-CM

## 2018-09-01 MED ORDER — PREDNISONE 20 MG PO TABS: 40.0000 mg | ORAL_TABLET | Freq: Every day | ORAL | 0 refills | Status: DC

## 2018-09-02 ENCOUNTER — Ambulatory Visit: Payer: Medicaid Other

## 2018-09-02 NOTE — Telephone Encounter (Signed)
This was refilled on 09/01/2018 in orders only encounter. Kinnie Feil, RN, BSN

## 2018-09-03 ENCOUNTER — Other Ambulatory Visit: Payer: Self-pay

## 2018-09-03 ENCOUNTER — Encounter (INDEPENDENT_AMBULATORY_CARE_PROVIDER_SITE_OTHER): Payer: Self-pay

## 2018-09-03 ENCOUNTER — Encounter: Payer: Self-pay | Admitting: Internal Medicine

## 2018-09-03 ENCOUNTER — Ambulatory Visit (INDEPENDENT_AMBULATORY_CARE_PROVIDER_SITE_OTHER): Payer: Self-pay | Admitting: Internal Medicine

## 2018-09-03 VITALS — BP 158/99 | HR 101 | Temp 98.9°F | Ht 66.0 in | Wt 196.9 lb

## 2018-09-03 DIAGNOSIS — Z79899 Other long term (current) drug therapy: Secondary | ICD-10-CM

## 2018-09-03 DIAGNOSIS — R5381 Other malaise: Secondary | ICD-10-CM

## 2018-09-03 DIAGNOSIS — I10 Essential (primary) hypertension: Secondary | ICD-10-CM

## 2018-09-03 DIAGNOSIS — R5383 Other fatigue: Secondary | ICD-10-CM

## 2018-09-03 DIAGNOSIS — M6282 Rhabdomyolysis: Secondary | ICD-10-CM

## 2018-09-03 DIAGNOSIS — R05 Cough: Secondary | ICD-10-CM

## 2018-09-03 MED ORDER — PREDNISONE 10 MG PO TABS: 10.0000 mg | ORAL_TABLET | Freq: Every day | ORAL | 0 refills | Status: DC

## 2018-09-03 MED ORDER — METOPROLOL TARTRATE 25 MG PO TABS: 25.0000 mg | ORAL_TABLET | Freq: Two times a day (BID) | ORAL | 2 refills | Status: DC

## 2018-09-03 NOTE — Progress Notes (Signed)
   CC: Medication Refill  HPI:  [name] is a 37 y.o. with PMH as below.   Please see A&P for assessment of the patient's chronic medical conditions.   CK at last visit 164.   Past Medical History:  Diagnosis Date  . Hypertension   . Vertigo    Review of Systems:   Review of Systems  Constitutional: Positive for malaise/fatigue. Negative for diaphoresis and fever.  Respiratory: Positive for cough and sputum production. Negative for hemoptysis, shortness of breath and wheezing.   Cardiovascular: Negative for chest pain, palpitations, claudication and leg swelling.  Gastrointestinal: Negative for abdominal pain, nausea and vomiting.  Musculoskeletal: Negative for back pain, joint pain and myalgias.  Neurological: Negative for dizziness, tingling and sensory change.  All other systems reviewed and are negative.   Physical Exam:  Constitution: NAD, cooperative HEENT: no scleral icterus, EOM intact, [place]/AT Cardio: tachycardic, regular rhythm, no m/r/g Respiratory: CTAB, no wheezing, rales, rhonchi Abdominal: NTTP, soft, non-distended MSK: NTTP, no edema, strength 5/5 b/l UE & LE Neuro: A&Ox3, normal affect Skin: c/d/i    Vitals:   09/03/18 1546  BP: (!) 158/99  Pulse: (!) 101  Temp: 98.9 F (37.2 C)  TempSrc: Oral  SpO2: 100%  Weight: 196 lb 14.4 oz (89.3 kg)  Height: 5\' 6"  (1.676 m)     Assessment & Plan:   See Encounters Tab for problem based charting.  Patient seen with Dr. Oswaldo Done

## 2018-09-03 NOTE — Assessment & Plan Note (Signed)
BP Readings from Last 3 Encounters:  09/03/18 (!) 158/99  08/13/18 129/82  08/05/18 (!) 150/84   She was previously on metoprolol 25mg  bid but was prescribed norvasc 10mg  qd at hospital discharge 8/14. She has only been taking her metoprolol in the mornings as her bp at night is ~125/90. We discussed taking her metoprolol bid and to record her blood pressures to get a consistent reading as she would like to decrease her medications.   - cont. Metoprolol 25 mg bid - cont norvasc 10 mg

## 2018-09-03 NOTE — Patient Instructions (Addendum)
Thank you for coming to the clinic today. It was a pleasure to see you.   Please decrease your Prednisone to 30mg  (3 tablets) once per day.  Please return in one month for follow-up and make an appointment with rheumatology as soon as you are able.

## 2018-09-03 NOTE — Assessment & Plan Note (Signed)
She has been asymptomatic since last visit - no muscle pain, fever, abdominal pain, or changes in urination. She has been unable to see rheumatology due to not having insurance. She is planning to renew her insurance in the next couple of weeks and then will reschedule. She has run out of her prednisone for two days and states she does feel more fatigued.   - taper to prednisone 30mg  qd one month - check CK  - return in one month

## 2018-09-04 LAB — CK: CK TOTAL: 113 U/L (ref 24–173)

## 2018-09-04 NOTE — Progress Notes (Signed)
Internal Medicine Clinic Attending  I saw and evaluated the patient.  I personally confirmed the key portions of the history and exam documented by Dr. Cleaster Corin and I reviewed pertinent patient test results.  The assessment, diagnosis, and plan were formulated together and I agree with the documentation in the resident's note.  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 Muscle biopsy with necrosis, no inclusion bodies seen. She has delayed her follow up with rheumatology due to cost, but we explained this will be critical. We will continue a very slow prednisone taper while monitoring for return of symptoms of rhabdo. No adverse effects of the prednisone so far.

## 2018-09-05 ENCOUNTER — Telehealth: Payer: Self-pay | Admitting: Internal Medicine

## 2018-09-05 NOTE — Telephone Encounter (Signed)
Pls contact pt [phone], checking on lab results

## 2018-09-08 ENCOUNTER — Telehealth: Payer: Self-pay

## 2018-09-08 ENCOUNTER — Telehealth: Payer: Self-pay | Admitting: *Deleted

## 2018-09-08 NOTE — Telephone Encounter (Signed)
Added to new encounter 

## 2018-09-08 NOTE — Telephone Encounter (Signed)
Requesting lab results. Pt can be reach at [phone] or [phone].

## 2018-09-08 NOTE — Telephone Encounter (Signed)
Attempted to rtc to pt, got message that the call could not be completed to try later

## 2018-09-08 NOTE — Telephone Encounter (Signed)
Was this patient contacted regarding lab work?

## 2018-09-08 NOTE — Telephone Encounter (Signed)
Called pt, gave her the normal value for last CK, she wants to know if she should go back to her normal prednisone dose starting today?

## 2018-09-08 NOTE — Telephone Encounter (Signed)
She is supposed to be on prednisone 30 mg for 1 month and then follow up with [place]

## 2018-09-08 NOTE — Telephone Encounter (Signed)
rtc again, did not answer

## 2018-09-09 NOTE — Telephone Encounter (Signed)
Attempted to reach patient to relay message from Dr. Heide Spark. No answer; recording came on after many rings stating call cannot be completed at this time. Unable to leave VM. Kinnie Feil, RN, BSN

## 2018-09-10 NOTE — Telephone Encounter (Signed)
Thank you :)

## 2018-09-12 NOTE — Telephone Encounter (Signed)
Have tried this number as well as two other numbers that patient has relayed to front office staff. This number also states, "Call cannot be completed at this time." L. Leward Quan, RN, BSN

## 2018-09-12 NOTE — Telephone Encounter (Signed)
Again tried to contact patient regarding labs and prednisone dose. First number: "Call cannot be completed at this time." Second number: no answer after many rings and no VM set up. Kinnie Feil, RN, BSN

## 2018-10-02 ENCOUNTER — Telehealth: Payer: Self-pay | Admitting: Internal Medicine

## 2018-10-02 ENCOUNTER — Ambulatory Visit (INDEPENDENT_AMBULATORY_CARE_PROVIDER_SITE_OTHER): Payer: Self-pay | Admitting: Internal Medicine

## 2018-10-02 ENCOUNTER — Other Ambulatory Visit: Payer: Self-pay

## 2018-10-02 VITALS — BP 156/86 | HR 95 | Temp 99.6°F | Ht 63.0 in | Wt 214.0 lb

## 2018-10-02 DIAGNOSIS — Z7952 Long term (current) use of systemic steroids: Secondary | ICD-10-CM

## 2018-10-02 DIAGNOSIS — M6282 Rhabdomyolysis: Secondary | ICD-10-CM

## 2018-10-02 DIAGNOSIS — R21 Rash and other nonspecific skin eruption: Secondary | ICD-10-CM

## 2018-10-02 DIAGNOSIS — M609 Myositis, unspecified: Secondary | ICD-10-CM

## 2018-10-02 MED ORDER — PREDNISONE 10 MG PO TABS: 30.0000 mg | ORAL_TABLET | Freq: Every day | ORAL | 1 refills | Status: DC

## 2018-10-02 NOTE — Patient Instructions (Signed)
FOLLOW-UP INSTRUCTIONS When: 4-6 wks or sooner if your symptoms worsen or fail to improve What to bring: All of your medications  I have refilled your prednisone today. Please attempt to take 20mg  daily and notify us if this is not sufficient. I have refilled the script for 30mg  daily, but would prefer that you treat with 20mg  daily.  I have placed a referral to Rheumatology again. Someone will call you to schedule.   Thank you for your visit to the [facility] today. If you have any questions or concerns please call us at [phone].

## 2018-10-02 NOTE — Progress Notes (Signed)
   CC: muscle pain  HPI:[name] is a 37 y.o. female who presents for evaluation of recent diagnosis of myositis unspecified and expiration of her steroid script. Please see individual problem based A/P for details.  CK elevation : Recent diagnosis of unspecified myositis lab evaluation thus far unremarkable.  The patient was referred to rheumatologist but was not was unable to afford the co-pay and she had to visit.  She endorses 3 days of general malaise, muscle aches, confusion, as well as weakness since running short of her prednisone this past Saturday.  Plan: I have placed a referral to [facility] rheumatology Continue prednisone 30 mg daily though she was seen by rheumatologist  Rash: Concern for nonspecific rash in the axilla bilaterally.  This is worsened by use of her deodorant.  The rash is nonerythematous, non-edematous, flat, multiple skin folds less than 4 to 6 mm typically elongated with the skin folds, best diabetes areas of epidermal dissociation from the dermis that do not appear to be inflamed and are only mildly pruritic as per the patient.  There is no evidence of rash underneath her breast.  There is evidence of excoriations bilaterally with the patient endorsing that she induced these. Plan:  I will advised the patient to refrain from use of deodorants, or typical soaps for the next several days to continue to monitor this rash at this time.  Currently this is a nonspecific rash and aside from possible atopic dermatitis with dislodged epidermis due to mechanical force application I am uncertain of the etiology.  This is not consistent with a dermatomyositis consistent rash.  PHQ-9: Based on the patients    Office Visit from 10/02/2018 in [facility]  PHQ-9 Total Score  1     score we have decided to monitor.  Past Medical History:  Diagnosis Date  . Hypertension   . Vertigo    Review of Systems: ROS negative except as per  HPI  Physical Exam: Vitals:   10/02/18 1610  BP: (!) 156/86  Pulse: 95  Temp: 99.6 F (37.6 C)  TempSrc: Oral  SpO2: 92%  Weight: 214 lb (97.1 kg)  Height: 5\' 3"  (1.6 m)   General: A/O x4, no acute distress, afebrile, nondiaphoretic Skin: Please see rash and A/P  Assessment & Plan:   See Encounters Tab for problem based charting.  Patient discussed with Dr. Oswaldo Done

## 2018-10-03 ENCOUNTER — Encounter: Payer: Self-pay | Admitting: Internal Medicine

## 2018-10-03 DIAGNOSIS — B9789 Other viral agents as the cause of diseases classified elsewhere: Secondary | ICD-10-CM

## 2018-10-03 DIAGNOSIS — R21 Rash and other nonspecific skin eruption: Secondary | ICD-10-CM | POA: Insufficient documentation

## 2018-10-03 DIAGNOSIS — M609 Myositis, unspecified: Secondary | ICD-10-CM | POA: Insufficient documentation

## 2018-10-03 HISTORY — DX: Other viral agents as the cause of diseases classified elsewhere: B97.89

## 2018-10-03 NOTE — Progress Notes (Signed)
Internal Medicine Clinic Attending  Case discussed with Dr. Harbrecht at the time of the visit.  We reviewed the resident's history and exam and pertinent patient test results.  I agree with the assessment, diagnosis, and plan of care documented in the resident's note.   

## 2018-10-03 NOTE — Assessment & Plan Note (Signed)
Rash: Concern for nonspecific rash in the axilla bilaterally.  This is worsened by use of her deodorant.  The rash is nonerythematous, non-edematous, flat, multiple skin folds less than 4 to 6 mm typically elongated with the skin folds, best diabetes areas of epidermal dissociation from the dermis that do not appear to be inflamed and are only mildly pruritic as per the patient.  There is no evidence of rash underneath her breast.  There is evidence of excoriations bilaterally with the patient endorsing that she induced these. Plan:  I will advised the patient to refrain from use of deodorants, or typical soaps for the next several days to continue to monitor this rash at this time.  Currently this is a nonspecific rash and aside from possible atopic dermatitis with dislodged epidermis due to mechanical force application I am uncertain of the etiology.  This is not consistent with a dermatomyositis consistent rash.

## 2018-10-03 NOTE — Assessment & Plan Note (Signed)
CK elevation : Recent diagnosis of unspecified myositis lab evaluation thus far unremarkable.  The patient was referred to rheumatologist but was not was unable to afford the co-pay and she had to visit.  She endorses 3 days of general malaise, muscle aches, confusion, as well as weakness since running short of her prednisone this past Saturday.  Plan: I have placed a referral to [facility] rheumatology Continue prednisone 30 mg daily though she was seen by rheumatologist

## 2018-10-10 ENCOUNTER — Ambulatory Visit: Payer: Medicaid Other

## 2018-10-10 ENCOUNTER — Encounter: Payer: Self-pay | Admitting: Internal Medicine

## 2018-10-21 ENCOUNTER — Ambulatory Visit: Payer: Medicaid Other

## 2018-10-21 ENCOUNTER — Encounter: Payer: Self-pay | Admitting: Internal Medicine

## 2018-11-11 ENCOUNTER — Ambulatory Visit: Payer: Medicaid Other

## 2018-11-13 ENCOUNTER — Other Ambulatory Visit: Payer: Self-pay | Admitting: Internal Medicine

## 2018-11-13 ENCOUNTER — Ambulatory Visit: Payer: Medicaid Other

## 2018-11-13 NOTE — Telephone Encounter (Signed)
Called pharm, pt has a refill for this med, they will fill and notify pt

## 2018-11-13 NOTE — Telephone Encounter (Signed)
Needs refills on predniSONE (DELTASONE) 10 MG tablet [id number] [place]- [place], [place] - [address] AT [facility] [address] [address]; pt contact [phone]

## 2018-12-16 ENCOUNTER — Encounter: Payer: Self-pay | Admitting: Internal Medicine

## 2019-01-15 ENCOUNTER — Telehealth: Payer: Self-pay | Admitting: *Deleted

## 2019-01-15 NOTE — Telephone Encounter (Signed)
PATIENT PHONE NUMBER IS INCORRECT. SPOKE WITH MOTHER. SHE DO NOT KNOW DAUGHTER'S PHONE NUMBER. SHE WILL HAVE HER TO CALL THE CLINIC. PATIENT NEEDS TO CALL THE FC AT [facility] FOR APPOINTMENT.

## 2019-01-26 ENCOUNTER — Encounter: Payer: Self-pay | Admitting: Internal Medicine

## 2019-01-26 ENCOUNTER — Encounter: Payer: Medicaid Other | Admitting: Internal Medicine

## 2019-01-30 ENCOUNTER — Encounter: Payer: Self-pay | Admitting: Internal Medicine

## 2019-02-16 ENCOUNTER — Telehealth: Payer: Self-pay

## 2019-02-16 NOTE — Telephone Encounter (Signed)
error 

## 2019-02-27 ENCOUNTER — Other Ambulatory Visit: Payer: Self-pay | Admitting: Internal Medicine

## 2019-02-27 DIAGNOSIS — M6282 Rhabdomyolysis: Secondary | ICD-10-CM

## 2019-03-11 ENCOUNTER — Telehealth: Payer: Self-pay | Admitting: Internal Medicine

## 2019-03-11 NOTE — Telephone Encounter (Signed)
LVM, we cannot see the pateint, she is not a patient of this office or [place]. We are not seeing patients in the office anyway. Suggested she go to urgent care

## 2019-03-11 NOTE — Telephone Encounter (Signed)
Copied from CRM [id number]. Topic: Appointment Scheduling - Scheduling Inquiry for Clinic >> Mar 11, 2019 11:44 AM Richarda Blade wrote: Reason for CRM: The patient would like an appointment because of the symtoms she is having. She stated that they are not [place] symptoms but would not go into detail. The agent called the practice 3 times and could not get through. She would like a call back on her mobile number [phone]

## 2019-05-18 ENCOUNTER — Encounter: Payer: Medicaid Other | Admitting: Internal Medicine

## 2019-05-19 ENCOUNTER — Other Ambulatory Visit: Payer: Self-pay

## 2019-05-19 ENCOUNTER — Ambulatory Visit (INDEPENDENT_AMBULATORY_CARE_PROVIDER_SITE_OTHER): Payer: Self-pay | Admitting: Internal Medicine

## 2019-05-19 VITALS — BP 160/90 | HR 97 | Temp 93.0°F | Ht 63.0 in | Wt 221.6 lb

## 2019-05-19 DIAGNOSIS — Z7952 Long term (current) use of systemic steroids: Secondary | ICD-10-CM

## 2019-05-19 DIAGNOSIS — I10 Essential (primary) hypertension: Secondary | ICD-10-CM

## 2019-05-19 DIAGNOSIS — M609 Myositis, unspecified: Secondary | ICD-10-CM

## 2019-05-19 DIAGNOSIS — Z79899 Other long term (current) drug therapy: Secondary | ICD-10-CM

## 2019-05-19 DIAGNOSIS — M6282 Rhabdomyolysis: Secondary | ICD-10-CM

## 2019-05-19 LAB — POCT GLYCOSYLATED HEMOGLOBIN (HGB A1C): Hemoglobin A1C: 5.8 % — AB (ref 4.0–5.6)

## 2019-05-19 LAB — GLUCOSE, CAPILLARY: Glucose-Capillary: 116 mg/dL — ABNORMAL HIGH (ref 70–99)

## 2019-05-19 MED ORDER — PREDNISONE 10 MG PO TABS: 10.0000 mg | ORAL_TABLET | Freq: Every day | ORAL | 1 refills | Status: DC

## 2019-05-19 NOTE — Patient Instructions (Signed)
Ms. Comes, It was a pleasure meeting you today! I look forward to serving as your primary care doctor.   I'd like you to keep taking the Prednisone 10 mg for now and will discuss a potential taper once we get the lab work back.  I would encourage you to exercise to the extent you're comfortable with. It should not increase your risk of a relapse and should combat the side effects of the steroids.  I will have our office look into the rheumatology referral so that you can get established with them.   Take care! Dr. Koleen Distance

## 2019-05-19 NOTE — Progress Notes (Signed)
   CC: myositis   HPI:  [name] is a 38 y.o. female with PMHx listed below who presents for follow-up of myositis. 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 She has been on chronic Prednisone since diagnosis. We have been trying to get her in with rheumatologist, but this has been challenging due to co-pay amount. She has been able to slowly taper Prednisone over the last few months from 40 mg down to 10 mg daily. She denies any bothersome side effects, apart from significant weight gain of over 40 lbs.   Past Medical History:  Diagnosis Date  . Hypertension   . Rhabdomyolysis 07/16/2018  . Vertigo    Review of Systems:  Review of Systems  All other systems reviewed and are negative.   Physical Exam:  Vitals:   05/19/19 1539  BP: (!) 160/90  Pulse: 97  Temp: (!) 93 F (33.9 C)  TempSrc: Oral  SpO2: 100%  Weight: 221 lb 9.6 oz (100.5 kg)  Height: 5\' 3"  (1.6 m)   Physical Exam Constitutional:      General: She is not in acute distress.    Appearance: Normal appearance.  Eyes:     Conjunctiva/sclera: Conjunctivae normal.  Cardiovascular:     Rate and Rhythm: Normal rate and regular rhythm.  Pulmonary:     Effort: Pulmonary effort is normal.     Breath sounds: Normal breath sounds.  Abdominal:     Palpations: Abdomen is soft.     Tenderness: There is no abdominal tenderness.  Musculoskeletal:     Right lower leg: No edema.     Left lower leg: No edema.  Neurological:     Mental Status: She is alert.  Psychiatric:        Mood and Affect: Mood normal.        Behavior: Behavior normal.      Assessment & Plan:   See Encounters Tab for problem based charting.  Patient discussed with Dr. Angelia Mould

## 2019-05-20 ENCOUNTER — Telehealth: Payer: Self-pay | Admitting: Internal Medicine

## 2019-05-20 LAB — CMP14 + ANION GAP
ALT: 13 IU/L (ref 0–32)
AST: 12 IU/L (ref 0–40)
Albumin/Globulin Ratio: 1.7 (ref 1.2–2.2)
Albumin: 4.5 g/dL (ref 3.8–4.8)
Alkaline Phosphatase: 78 IU/L (ref 39–117)
Anion Gap: 19 mmol/L — ABNORMAL HIGH (ref 10.0–18.0)
BUN/Creatinine Ratio: 11 (ref 9–23)
BUN: 9 mg/dL (ref 6–20)
Bilirubin Total: 0.2 mg/dL (ref 0.0–1.2)
CO2: 20 mmol/L (ref 20–29)
Calcium: 9.9 mg/dL (ref 8.7–10.2)
Chloride: 103 mmol/L (ref 96–106)
Creatinine, Ser: 0.82 mg/dL (ref 0.57–1.00)
GFR calc Af Amer: 105 mL/min/{1.73_m2} (ref >59)
GFR calc non Af Amer: 91 mL/min/{1.73_m2} (ref >59)
Globulin, Total: 2.6 g/dL (ref 1.5–4.5)
Glucose: 116 mg/dL — ABNORMAL HIGH (ref 65–99)
Potassium: 3.9 mmol/L (ref 3.5–5.2)
Sodium: 142 mmol/L (ref 134–144)
Total Protein: 7.1 g/dL (ref 6.0–8.5)

## 2019-05-20 LAB — CK: Total CK: 109 U/L (ref 32–182)

## 2019-05-20 NOTE — Telephone Encounter (Signed)
Pt calling back about her lab test results.

## 2019-05-20 NOTE — Telephone Encounter (Signed)
Returned call to patient that all of her labs were normal. Also gave her a different number to try for [facility] office, as she said the one we gave her was the financial department.

## 2019-05-24 NOTE — Assessment & Plan Note (Signed)
Blood pressure elevated today. Currently on Amlodipine 5 mg and Lopressor 25 mg BID. Will titrate regimen at next visit if remains persistently elevated.

## 2019-05-24 NOTE — Assessment & Plan Note (Signed)
CK has remained within normal limits on reduced Prednisone dosing. Would prefer to have rheumatologist give recommendations on further tapering. Her referral has been processed so hopefully she will have an appointment within the next few weeks. Will continue Prednisone 10 mg.  A1C to screen for steroid-induced diabetes was normal.  She is otherwise tolerating well. Follow-up in a couple of months after rheumatologist appointment.

## 2019-05-25 NOTE — Progress Notes (Signed)
Internal Medicine Clinic Attending  Case discussed with Dr. Bloomfield at the time of the visit.  We reviewed the resident's history and exam and pertinent patient test results.  I agree with the assessment, diagnosis, and plan of care documented in the resident's note.  

## 2019-06-08 ENCOUNTER — Ambulatory Visit: Payer: Medicaid Other

## 2019-11-16 ENCOUNTER — Other Ambulatory Visit: Payer: Self-pay

## 2019-11-16 ENCOUNTER — Ambulatory Visit: Payer: Medicaid Other | Admitting: Internal Medicine

## 2019-11-16 ENCOUNTER — Encounter: Payer: Self-pay | Admitting: Internal Medicine

## 2019-11-16 VITALS — BP 152/99 | HR 115 | Temp 98.7°F | Ht 63.0 in | Wt 226.1 lb

## 2019-11-16 DIAGNOSIS — I10 Essential (primary) hypertension: Secondary | ICD-10-CM

## 2019-11-16 DIAGNOSIS — M609 Myositis, unspecified: Secondary | ICD-10-CM | POA: Diagnosis not present

## 2019-11-16 DIAGNOSIS — Z7952 Long term (current) use of systemic steroids: Secondary | ICD-10-CM | POA: Diagnosis not present

## 2019-11-16 DIAGNOSIS — Z79899 Other long term (current) drug therapy: Secondary | ICD-10-CM

## 2019-11-16 MED ORDER — PREDNISONE 1 MG PO TABS: ORAL_TABLET | ORAL | 1 refills | Status: DC

## 2019-11-16 MED ORDER — PREDNISONE 5 MG PO TABS: 5.0000 mg | ORAL_TABLET | Freq: Every day | ORAL | 1 refills | Status: DC

## 2019-11-16 MED ORDER — AMLODIPINE BESYLATE 5 MG PO TABS: 5.0000 mg | ORAL_TABLET | Freq: Every day | ORAL | 1 refills | Status: DC

## 2019-11-16 NOTE — Patient Instructions (Addendum)
Kayla Adkins,   We will start going down on your prednisone.  You will take a total of 9 mg for 2 weeks, then 8 mg for 2 weeks, then 7 mg for 2 weeks, then 6 mg for 2 weeks, and then 5 mg daily.  If at any point in time you start having muscle pains please let us know we may need to go up on the dose.  Please come back in 3 months to recheck a CK level to see how you are doing on the 5 mg of prednisone.  I placed a new referral for rheumatology.  I sent medication for your high blood pressure.  You will take amlodipine 5 mg 1 tablet daily.  Please make a follow-up appointment in 3 months.  Or sooner if you need to be seen before then.  - Dr. Frederico Hamman

## 2019-11-16 NOTE — Assessment & Plan Note (Signed)
Patient has a history of steroid-induced hypertension that was previously treated with amlodipine 5 and Lopressor 25 mg twice daily.  She reports she has not been taking these medications for the past 6 or 7 months.  Blood pressure not at goal, 153/111.  Will resume amlodipine 5 mg daily and reassess at follow-up.

## 2019-11-16 NOTE — Assessment & Plan Note (Signed)
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  She has been on chronic steroid therapy since then.  She has been tapered to prednisone 10 mg daily which she has been taking for 8 months now.  Goal was to refer her to rheumatology for further evaluation but she has not been able to coordinate an appointment due to issues with the referral.  She reports doing well and denies muscle aches or pains.  However she is concerned for long-term steroid side effects some of which she is experiencing already such as weight gain and hypertension.  She is also requesting CK level check.   - Ordered CK level and place new referral to rheumatology - Discussed starting a very slow prednisone taper: 9 mg x 2 wks --> 8 mg x 2 wks --> 7 mg x2 wks --> 6mg  x 2 wks --> 5 mg QD  - She is to follow up in 3 months at which time she should be taking 5 mg QD. She was advised to call if she experience symptom recurrence during taper

## 2019-11-16 NOTE — Progress Notes (Signed)
   CC: Idiopathic myositis and HTN  HPI:  [name] is a 38 y.o. year-old female with PMH listed below who presents to clinic for idiopathic myositis and HTN. Please see problem based assessment and plan for further details.   Past Medical History:  Diagnosis Date  . Hypertension   . Rhabdomyolysis 07/16/2018  . Vertigo    Review of Systems:   Review of Systems  Constitutional: Negative for chills, fever and malaise/fatigue.  Respiratory: Negative for shortness of breath.   Cardiovascular: Negative for chest pain, palpitations and leg swelling.  Musculoskeletal: Negative for falls, joint pain and myalgias.    Physical Exam:  Vitals:   11/16/19 1349 11/16/19 1357  BP: (!) 153/111 (!) 152/99  Pulse: (!) 118 (!) 115  Temp: 98.7 F (37.1 C)   TempSrc: Oral   SpO2: 100%   Weight: 226 lb 1.6 oz (102.6 kg)   Height: 5\' 3"  (1.6 m)     General: Well-appearing female in no acute distress Cardiac: regular rate and rhythm, nl S1/S2, no murmurs, rubs or gallops  MSK: No gross abnormalities or tenderness to palpation  Assessment & Plan:   See Encounters Tab for problem based charting.  Patient discussed with Dr. Angelia Mould

## 2019-11-17 LAB — CK: Total CK: 103 U/L (ref 32–182)

## 2019-11-18 ENCOUNTER — Telehealth: Payer: Self-pay | Admitting: Internal Medicine

## 2019-11-18 NOTE — Telephone Encounter (Signed)
Pls contact pt regarding results [phone]

## 2019-11-18 NOTE — Progress Notes (Signed)
Internal Medicine Clinic Attending  Case discussed with Dr. Santos-Sanchez at the time of the visit.  We reviewed the resident's history and exam and pertinent patient test results.  I agree with the assessment, diagnosis, and plan of care documented in the resident's note.    

## 2019-11-18 NOTE — Telephone Encounter (Signed)
Called pt again x 2. Phone rings a few times and then I get a message saying call can not be completed. If she calls back can let her now her results are normal and there will not be any changes in plan.

## 2019-11-24 ENCOUNTER — Telehealth: Payer: Self-pay | Admitting: *Deleted

## 2019-11-24 ENCOUNTER — Other Ambulatory Visit: Payer: Self-pay | Admitting: Internal Medicine

## 2019-11-24 NOTE — Telephone Encounter (Signed)
Needs refill on predniSONE (DELTASONE) 1 MG tablet ;pt contact [place], [place] AT [place]  Also  Pt is requesting to call regarding ck results [phone]

## 2019-11-24 NOTE — Telephone Encounter (Signed)
I sent 120 tablets of prednisone 1 mg  with 1 RF to her pharmacy 1 week ago.  Refill not indicated at this time.

## 2019-11-24 NOTE — Telephone Encounter (Signed)
Called pt x2, no answer. Please let her know her results are normal when she calls back. Will look into prednisone prescription. Need to make sure she is due for it. Thanks!

## 2019-11-24 NOTE — Telephone Encounter (Signed)
Pt rtc from dr Isac Sarna, she was given message and informed prednisone 1 mg was sent 2023/11/27 at that point line went dead, attempted to rtc, no answer, lm for rtc

## 2019-11-24 NOTE — Telephone Encounter (Signed)
Pt also wants results

## 2019-12-08 ENCOUNTER — Telehealth: Payer: Self-pay | Admitting: *Deleted

## 2019-12-08 NOTE — Telephone Encounter (Signed)
Call from pt - stated the pharmacy never received the rx for Prednisone 1 mg; only 5 mg.  I called [facility], talked to  [facility] they did not received Prednisone 1 mg on 11/16/19.Verbal order given for   Prednisone 1 mg Take 4 tablets x 2 weeks, then 3 tablets x 2 weeks, then 2 tablets x 2 weeks, then 1 tablet x 2 weeks then stop. Qty 120 x 1 RF  Pt was called and informed.

## 2019-12-09 NOTE — Telephone Encounter (Signed)
Thank you :)

## 2020-02-08 ENCOUNTER — Ambulatory Visit: Payer: Medicaid Other | Admitting: Internal Medicine

## 2020-02-08 ENCOUNTER — Encounter: Payer: Self-pay | Admitting: Internal Medicine

## 2020-02-08 VITALS — BP 136/85 | HR 92 | Temp 98.2°F | Wt 225.5 lb

## 2020-02-08 DIAGNOSIS — M609 Myositis, unspecified: Secondary | ICD-10-CM | POA: Diagnosis not present

## 2020-02-08 DIAGNOSIS — Z79899 Other long term (current) drug therapy: Secondary | ICD-10-CM | POA: Diagnosis not present

## 2020-02-08 DIAGNOSIS — I10 Essential (primary) hypertension: Secondary | ICD-10-CM

## 2020-02-08 DIAGNOSIS — Z23 Encounter for immunization: Secondary | ICD-10-CM | POA: Diagnosis not present

## 2020-02-08 DIAGNOSIS — Z7952 Long term (current) use of systemic steroids: Secondary | ICD-10-CM | POA: Diagnosis not present

## 2020-02-08 NOTE — Patient Instructions (Signed)
Kayla Adkins, It was great seeing you! So glad the decreased prednisone has helped you feel better. We will continue the 5 mg dosing for now until you can see a rheumatologist which the office staff is working on.  Take care, and I'll see you in a few months after your appointment.   I will check a CK today and let you know these results.   Dr. Chesley Mires

## 2020-02-09 ENCOUNTER — Encounter: Payer: Self-pay | Admitting: Internal Medicine

## 2020-02-09 LAB — CK: Total CK: 114 U/L (ref 32–182)

## 2020-02-09 NOTE — Assessment & Plan Note (Signed)
Blood pressure much improved on today's visit. Likely combination of decreased prednisone and initiation of amlodipine. She is tolerating medication well. Will continue current therapy.

## 2020-02-09 NOTE — Progress Notes (Signed)
   CC: idiopathic myositis and HTN  HPI:  [name] is a 39 y.o. 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 Please see problem based charting for further details.   Past Medical History:  Diagnosis Date  . Hypertension   . Rhabdomyolysis 07/16/2018  . Vertigo    Review of Systems:  Review of Systems  Constitutional: Negative for chills, fever and malaise/fatigue.  Respiratory: Negative for shortness of breath.   Cardiovascular: Negative for chest pain and palpitations.  Gastrointestinal: Negative for abdominal pain, constipation, diarrhea, nausea and vomiting.  Genitourinary: Negative for dysuria.  Musculoskeletal: Negative for falls and myalgias.  Neurological: Negative for dizziness, sensory change, focal weakness and headaches.  Psychiatric/Behavioral: Negative for depression.     Physical Exam:  Vitals:   02/08/20 1520  BP: 136/85  Pulse: 92  Temp: 98.2 F (36.8 C)  TempSrc: Oral  SpO2: 100%  Weight: 225 lb 8 oz (102.3 kg)   Physical Exam Constitutional:      General: She is not in acute distress.    Appearance: Normal appearance.  Eyes:     Conjunctiva/sclera: Conjunctivae normal.  Cardiovascular:     Rate and Rhythm: Normal rate and regular rhythm.     Pulses: Normal pulses.  Pulmonary:     Effort: Pulmonary effort is normal.     Breath sounds: Normal breath sounds.  Abdominal:     General: There is no distension.     Palpations: Abdomen is soft.     Tenderness: There is no abdominal tenderness.  Musculoskeletal:        General: No tenderness.     Cervical back: Normal range of motion and neck supple.     Right lower leg: No edema.     Left lower leg: No edema.  Skin:    General: Skin is warm and dry.  Neurological:     General: No focal deficit present.     Mental Status: She is alert and oriented to person, place, and time.  Psychiatric:        Mood and Affect: Mood normal.        Behavior:  Behavior normal.      Assessment & Plan:   See Encounters Tab for problem based charting.  Patient discussed with Dr. Heide Spark

## 2020-02-09 NOTE — Assessment & Plan Note (Signed)
Patient did very well on slow Prednisone taper from 10 mg to 5 mg. She states she feels much better compared to last visit and has noticed significant improvement in the side effects she was experiencing. The referral to rheumatology is unfortunately still in process but office staff well aware and are following up to ensure she gets an appointment as soon as possible. Will continue her on low dose Prednisone 5 mg until she can be seen. Repeat CK level today is normal.

## 2020-02-10 NOTE — Progress Notes (Signed)
Internal Medicine Clinic Attending  Case discussed with Dr. Bloomfield at the time of the visit.  We reviewed the resident's history and exam and pertinent patient test results.  I agree with the assessment, diagnosis, and plan of care documented in the resident's note.  

## 2020-02-22 ENCOUNTER — Encounter: Payer: Medicaid Other | Admitting: Internal Medicine

## 2020-02-23 ENCOUNTER — Other Ambulatory Visit: Payer: Self-pay | Admitting: *Deleted

## 2020-02-23 MED ORDER — PREDNISONE 5 MG PO TABS: 5.0000 mg | ORAL_TABLET | Freq: Every day | ORAL | 1 refills | Status: DC

## 2020-02-26 ENCOUNTER — Emergency Department (HOSPITAL_COMMUNITY)
Admission: EM | Admit: 2020-02-26 | Discharge: 2020-02-26 | Disposition: A | Discharge status: Home / Self Care | Payer: Medicaid Other | Attending: Emergency Medicine | Admitting: Emergency Medicine

## 2020-02-26 ENCOUNTER — Other Ambulatory Visit: Payer: Self-pay

## 2020-02-26 ENCOUNTER — Encounter (HOSPITAL_COMMUNITY): Payer: Self-pay

## 2020-02-26 ENCOUNTER — Telehealth: Payer: Self-pay | Admitting: *Deleted

## 2020-02-26 ENCOUNTER — Emergency Department (HOSPITAL_COMMUNITY): Payer: Medicaid Other

## 2020-02-26 DIAGNOSIS — R0789 Other chest pain: Secondary | ICD-10-CM | POA: Diagnosis not present

## 2020-02-26 DIAGNOSIS — E876 Hypokalemia: Secondary | ICD-10-CM | POA: Insufficient documentation

## 2020-02-26 DIAGNOSIS — Z87891 Personal history of nicotine dependence: Secondary | ICD-10-CM | POA: Insufficient documentation

## 2020-02-26 DIAGNOSIS — I10 Essential (primary) hypertension: Secondary | ICD-10-CM | POA: Insufficient documentation

## 2020-02-26 DIAGNOSIS — M79602 Pain in left arm: Secondary | ICD-10-CM | POA: Diagnosis not present

## 2020-02-26 DIAGNOSIS — M5412 Radiculopathy, cervical region: Secondary | ICD-10-CM | POA: Diagnosis not present

## 2020-02-26 DIAGNOSIS — M5410 Radiculopathy, site unspecified: Secondary | ICD-10-CM | POA: Diagnosis not present

## 2020-02-26 DIAGNOSIS — Z79899 Other long term (current) drug therapy: Secondary | ICD-10-CM | POA: Insufficient documentation

## 2020-02-26 DIAGNOSIS — R079 Chest pain, unspecified: Secondary | ICD-10-CM | POA: Diagnosis not present

## 2020-02-26 LAB — CBC
HCT: 40 % (ref 36.0–46.0)
Hemoglobin: 12.4 g/dL (ref 12.0–15.0)
MCH: 29.4 pg (ref 26.0–34.0)
MCHC: 31 g/dL (ref 30.0–36.0)
MCV: 94.8 fL (ref 80.0–100.0)
Platelets: 274 10*3/uL (ref 150–400)
RBC: 4.22 MIL/uL (ref 3.87–5.11)
RDW: 15.5 % (ref 11.5–15.5)
WBC: 6.9 10*3/uL (ref 4.0–10.5)
nRBC: 0 % (ref 0.0–0.2)

## 2020-02-26 LAB — BASIC METABOLIC PANEL
Anion gap: 11 (ref 5–15)
BUN: 9 mg/dL (ref 6–20)
CO2: 22 mmol/L (ref 22–32)
Calcium: 8.8 mg/dL — ABNORMAL LOW (ref 8.9–10.3)
Chloride: 107 mmol/L (ref 98–111)
Creatinine, Ser: 0.64 mg/dL (ref 0.44–1.00)
GFR calc Af Amer: >60 mL/min (ref >60)
GFR calc non Af Amer: >60 mL/min (ref >60)
Glucose, Bld: 106 mg/dL — ABNORMAL HIGH (ref 70–99)
Potassium: 3 mmol/L — ABNORMAL LOW (ref 3.5–5.1)
Sodium: 140 mmol/L (ref 135–145)

## 2020-02-26 LAB — I-STAT BETA HCG BLOOD, ED (MC, WL, AP ONLY): I-stat hCG, quantitative: <5 m[IU]/mL (ref <5)

## 2020-02-26 LAB — TROPONIN I (HIGH SENSITIVITY): Troponin I (High Sensitivity): <2 ng/L (ref <18)

## 2020-02-26 MED ORDER — FAMOTIDINE 20 MG PO TABS: 20.0000 mg | ORAL_TABLET | Freq: Two times a day (BID) | ORAL | 0 refills | Status: DC

## 2020-02-26 MED ORDER — IBUPROFEN 400 MG PO TABS
600.0000 mg | ORAL_TABLET | Freq: Once | ORAL | Status: AC
  Administered 2020-02-26: 600 mg via ORAL
  Filled 2020-02-26: qty 1

## 2020-02-26 MED ORDER — POTASSIUM CHLORIDE CRYS ER 20 MEQ PO TBCR
40.0000 meq | EXTENDED_RELEASE_TABLET | Freq: Once | ORAL | Status: AC
  Administered 2020-02-26: 40 meq via ORAL
  Filled 2020-02-26: qty 2

## 2020-02-26 MED ORDER — FAMOTIDINE 20 MG PO TABS
20.0000 mg | ORAL_TABLET | Freq: Once | ORAL | Status: AC
  Administered 2020-02-26: 20 mg via ORAL
  Filled 2020-02-26: qty 1

## 2020-02-26 MED ORDER — IBUPROFEN 600 MG PO TABS: 600.0000 mg | ORAL_TABLET | Freq: Four times a day (QID) | ORAL | 0 refills | Status: DC | PRN

## 2020-02-26 NOTE — ED Notes (Signed)
Patient verbalized understanding of dc instructions, vss, ambulatory with nad.   

## 2020-02-26 NOTE — ED Triage Notes (Signed)
Pt reports chest tightness for the past week, started out in her left hand but has now radiated to her arm and chest. Pt a.o, nad noted in triage. Taking prednisone for muscle decay

## 2020-02-26 NOTE — Telephone Encounter (Signed)
Patient called in c/o pain that began in left hand to left arm, travelled to back and now she is feeling it in her chest. Advised to head directly to ED. States she's already on her way. Kinnie Feil, BSN, RN-BC

## 2020-02-26 NOTE — ED Provider Notes (Signed)
[facility] EMERGENCY DEPARTMENT Provider Note   CSN: [id number] Arrival date & time: 02/26/20  1131     History Chief Complaint  Patient presents with  . Chest Pain  . Arm Pain    Kayla Adkins is a 39 y.o. female.  The history is provided by the patient and medical records. No language interpreter was used.  Chest Pain Arm Pain Associated symptoms include chest pain.   Kayla Adkins is a 39 y.o. female who presents to the Emergency Department complaining of chest and arm pain. She presents the emergency department complaining of one week of pain in her left arm. Pain is described as a tingling discomfort that started in her fingers and is now radiating up to her left shoulder and left lateral neck. The pain is also radiating around her left chest wall and back. Pain is constant but worse with range of motion of her arm and shoulder. It is not consistently reproducible with range of motion of her neck. She has no posterior neck pain. She has a history of muscle wasting, she does not know the formal diagnosis but has been on prednisone for the last two years for this condition. She is currently on 5 mg of prednisone daily but she did take a dose of 10 mg today thinking it may help her symptoms. She has not tried any over-the-counter medications for her this discomfort. She denies any fevers, cough, shortness of breath, nausea, vomiting, leg swelling or pain. No prior similar symptoms. She is right-hand dominant. She works from home. She does not take any hormones. No history of DVT/PE. No history of diabetes, hyperlipidemia. She does have a history of hypertension. No family history of significant coronary artery disease.    Past Medical History:  Diagnosis Date  . Hypertension   . Rhabdomyolysis 07/16/2018  . Vertigo     Patient Active Problem List   Diagnosis Date Noted  . Myositis 10/03/2018  . Rash 10/03/2018  . Hypertension 08/05/2018  . Vertigo  08/05/2018    Past Surgical History:  Procedure Laterality Date  . CESAREAN SECTION    . MUSCLE BIOPSY Right 07/24/2018   Procedure: MUSCLE BIOPSY THIGH;  Surgeon: Jimmye Norman, MD;  Location: [facility] OR;  Service: General;  Laterality: Right;     OB History   No obstetric history on file.     No family history on file.  Social History   Tobacco Use  . Smoking status: Former Smoker    Packs/day: 0.25    Years: 2.00    Pack years: 0.50    Types: Cigarettes    Quit date: 08/05/2013    Years since quitting: 6.5  . Smokeless tobacco: Never Used  Substance Use Topics  . Alcohol use: Yes    Comment: sometimes  . Drug use: No    Home Medications Prior to Admission medications   Medication Sig Start Date End Date Taking? Authorizing Provider  acetaminophen (TYLENOL) 500 MG tablet Take 1 tablet (500 mg total) by mouth every 6 (six) hours as needed for mild pain. 07/26/18   Zigmund Daniel., MD  amLODipine (NORVASC) 5 MG tablet Take 1 tablet (5 mg total) by mouth daily. 11/16/19 11/15/20  Burna Cash, MD  cyclobenzaprine (FLEXERIL) 10 MG tablet Take 1 tablet (10 mg total) by mouth 2 (two) times daily as needed for muscle spasms. 07/14/17   Jacalyn Lefevre, MD  famotidine (PEPCID) 20 MG tablet Take 1 tablet (20 mg total) by mouth  2 (two) times daily. 02/26/20   Tilden Fossa, MD  HYDROcodone-acetaminophen (NORCO/VICODIN) 5-325 MG tablet Take 1 tablet by mouth every 4 (four) hours as needed. Patient not taking: Reported on 07/16/2018 07/14/17   Jacalyn Lefevre, MD  ibuprofen (ADVIL) 600 MG tablet Take 1 tablet (600 mg total) by mouth every 6 (six) hours as needed. 02/26/20   Tilden Fossa, MD  meclizine (ANTIVERT) 25 MG tablet Take 1 tablet (25 mg total) by mouth 3 (three) times daily as needed for dizziness. 05/20/17   McDonald, Mia A, PA-C  metoprolol tartrate (LOPRESSOR) 25 MG tablet Take 1 tablet (25 mg total) by mouth 2 (two) times daily. 09/03/18   Seawell, Jaimie A, DO    ondansetron (ZOFRAN ODT) 4 MG disintegrating tablet Take 1 tablet (4 mg total) by mouth every 8 (eight) hours as needed for nausea. 07/14/18   Eber Hong, MD  ondansetron (ZOFRAN) 4 MG tablet Take 1 tablet (4 mg total) by mouth every 6 (six) hours. Patient not taking: Reported on 07/16/2018 05/20/17   McDonald, Pedro Earls A, PA-C  predniSONE (DELTASONE) 5 MG tablet Take 1 tablet (5 mg total) by mouth daily with breakfast. 02/23/20   Lenward Chancellor D, DO    Allergies    Patient has no known allergies.  Review of Systems   Review of Systems  Cardiovascular: Positive for chest pain.  All other systems reviewed and are negative.   Physical Exam Updated Vital Signs BP (!) 148/99   Pulse 80   Temp 98 F (36.7 C) (Oral)   Resp 18   Ht 5\' 6"  (1.676 m)   Wt 99.8 kg   LMP 01/29/2020   SpO2 100%   BMI 35.51 kg/m   Physical Exam Vitals and nursing note reviewed.  Constitutional:      Appearance: She is well-developed.  HENT:     Head: Normocephalic and atraumatic.  Cardiovascular:     Rate and Rhythm: Normal rate and regular rhythm.     Heart sounds: No murmur.  Pulmonary:     Effort: Pulmonary effort is normal. No respiratory distress.     Breath sounds: Normal breath sounds.  Abdominal:     Palpations: Abdomen is soft.     Tenderness: There is no abdominal tenderness. There is no guarding or rebound.  Musculoskeletal:        General: No tenderness.     Cervical back: Neck supple.     Comments: 2+ radial pulses bilaterally. Range of motion is intact throughout the left upper extremity but there is pain on full abduction of the shoulder.  Skin:    General: Skin is warm and dry.  Neurological:     Mental Status: She is alert and oriented to person, place, and time.     Comments: Five out of five strength in all four extremities with sensation to light touch intact in all four extremities  Psychiatric:        Behavior: Behavior normal.     ED Results / Procedures /  Treatments   Labs (all labs ordered are listed, but only abnormal results are displayed) Labs Reviewed  BASIC METABOLIC PANEL - Abnormal; Notable for the following components:      Result Value   Potassium 3.0 (*)    Glucose, Bld 106 (*)    Calcium 8.8 (*)    All other components within normal limits  CBC  I-STAT BETA HCG BLOOD, ED ([facility], [facility], [facility] ONLY)  TROPONIN I (HIGH SENSITIVITY)  TROPONIN I (  HIGH SENSITIVITY)    EKG EKG Interpretation  Date/Time:  Friday February 26 2020 11:35:19 EDT Ventricular Rate:  92 PR Interval:  134 QRS Duration: 86 QT Interval:  370 QTC Calculation: 457 R Axis:   -7 Text Interpretation: Normal sinus rhythm Minimal voltage criteria for LVH, may be normal variant ( R in aVL ) Borderline ECG Confirmed by Quintella Reichert [phone]) on 02/26/2020 2:10:24 PM   Radiology DG Chest 2 View  Result Date: 02/26/2020 CLINICAL DATA:  Chest pain, history hypertension EXAM: CHEST - 2 VIEW COMPARISON:  07/16/2018 FINDINGS: Upper normal heart size. Mediastinal contours and pulmonary vascularity normal. Lungs clear. No pulmonary infiltrate, pleural effusion, or pneumothorax. Osseous structures unremarkable. IMPRESSION: No acute abnormalities. Electronically Signed   By: Lavonia Dana M.D.   On: 02/26/2020 12:08    Procedures Procedures (including critical care time)  Medications Ordered in ED Medications  potassium chloride SA (KLOR-CON) CR tablet 40 mEq (has no administration in time range)  famotidine (PEPCID) tablet 20 mg (has no administration in time range)  ibuprofen (ADVIL) tablet 600 mg (has no administration in time range)    ED Course  I have reviewed the triage vital signs and the nursing notes.  Pertinent labs & imaging results that were available during my care of the patient were reviewed by me and considered in my medical decision making (see chart for details).    MDM Rules/Calculators/A&P                     Patient here for evaluation of pain and  tingling to the left upper extremity and pain in the left chest wall. She is non-toxic appearing on evaluation and in no acute distress. EKG is unchanged when compared to priors in troponin is negative. Presentation is not consistent with ACS, PE, dissection, cauda equina, septic joint, cellulitis, compartment syndrome. Discussed with patient home care for radiculopathy and neuropathic pain. Discussed outpatient follow-up and return precautions.  Final Clinical Impression(s) / ED Diagnoses Final diagnoses:  Radiculopathy of arm  Atypical chest pain  Hypokalemia    Rx / DC Orders ED Discharge Orders         Ordered    famotidine (PEPCID) 20 MG tablet  2 times daily     02/26/20 1434    ibuprofen (ADVIL) 600 MG tablet  Every 6 hours PRN     02/26/20 1434           Quintella Reichert, MD 02/26/20 1439

## 2020-02-29 ENCOUNTER — Ambulatory Visit: Payer: Medicaid Other | Admitting: Internal Medicine

## 2020-02-29 ENCOUNTER — Encounter: Payer: Self-pay | Admitting: Internal Medicine

## 2020-02-29 VITALS — BP 126/79 | HR 92 | Temp 98.6°F | Wt 230.1 lb

## 2020-02-29 DIAGNOSIS — Z7952 Long term (current) use of systemic steroids: Secondary | ICD-10-CM | POA: Diagnosis not present

## 2020-02-29 DIAGNOSIS — M79642 Pain in left hand: Secondary | ICD-10-CM | POA: Insufficient documentation

## 2020-02-29 DIAGNOSIS — Z124 Encounter for screening for malignant neoplasm of cervix: Secondary | ICD-10-CM | POA: Insufficient documentation

## 2020-02-29 DIAGNOSIS — M609 Myositis, unspecified: Secondary | ICD-10-CM | POA: Diagnosis not present

## 2020-02-29 NOTE — Patient Instructions (Signed)
Kayla Adkins, I apologize we were unable to successfully perform your pap smear today. I have placed a referral to a gynecologist in order to get this done.  I am glad you will see the rheumatologist tomorrow. Look forward to hearing how the visit goes!  For your thumb/wrist pain, you can continue wearing the brace (I would do it mostly at night). If this does not help you can try the elbow strap we talked about to see if this gives you relief.   Take care, Dr. Chesley Mires

## 2020-02-29 NOTE — Assessment & Plan Note (Signed)
Patient denies any recurrent symptoms. She continues on Prednisone 5 mg daily. She has an appointment with rheumatology scheduled for tomorrow.

## 2020-02-29 NOTE — Progress Notes (Signed)
   CC: left arm pain, pap smear  HPI:  [name] is a 39 y.o. 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 Please see encounters tab for further details.   Past Medical History:  Diagnosis Date  . Hypertension   . Rhabdomyolysis 07/16/2018  . Vertigo    Review of Systems:   Constitutional: no fevers, chills, weight loss CV: no chest pain, palpitations Pulm: no cough or shortness of breath GI: no n/v/d MSK: no other joint or muscle pain Neuro: no weakness, falls  Skin: no rashes  GU: no discharge, dyspareunia   Physical Exam:  Vitals:   02/29/20 1422  BP: 126/79  Pulse: 92  Temp: 98.6 F (37 C)  TempSrc: Oral  SpO2: 100%  Weight: 230 lb 1.6 oz (104.4 kg)   Physical Exam Exam conducted with a chaperone present.  Constitutional:      General: She is not in acute distress.    Appearance: Normal appearance.  Eyes:     Conjunctiva/sclera: Conjunctivae normal.  Cardiovascular:     Rate and Rhythm: Normal rate and regular rhythm.  Pulmonary:     Effort: Pulmonary effort is normal.     Breath sounds: Normal breath sounds.  Genitourinary:    Pubic Area: No rash.      Labia:        Right: No rash, tenderness or lesion.        Left: No rash, tenderness or lesion.      Urethra: No prolapse, urethral pain or urethral swelling.     Vagina: Normal.     Uterus: Normal.      Adnexa: Right adnexa normal and left adnexa normal.     Comments: Unable to visualize cervix.  Musculoskeletal:     Left wrist: No swelling or snuff box tenderness. Normal range of motion. Normal pulse.     Cervical back: Normal range of motion and neck supple.     Comments: Mild hyperesthesia over thenar eminence. Negative tinel's test, negative phalen's test.   Skin:    General: Skin is warm and dry.     Findings: No rash.  Neurological:     General: No focal deficit present.   Mental Status: She is alert and oriented to person, place, and time.     Motor: No weakness.  Psychiatric:        Mood and Affect: Mood normal.        Behavior: Behavior normal.      Assessment & Plan:   See Encounters Tab for problem based charting.  Patient discussed with Dr. Rogelia Boga

## 2020-02-29 NOTE — Assessment & Plan Note (Signed)
Patient presented for pap smear today. She does not recall when she last had one done. Unfortunately, both myself and the attending were unable to visualize cervix despite trying different speculums and methods. Will refer to gynecology in order to get updated pap smear.

## 2020-02-29 NOTE — Assessment & Plan Note (Signed)
Patient presents for follow-up from ED visit on 3/26. Presented for 1 week  History of pain that began in left hand and radiated up into arm, shoulder and chest.  ACS work-up was negative.  She recalls the pain began after driving for 2.5 hours straight. She says she grips the steering wheel fairly tight and primarily with her left hand. Sensory changes over thenar eminence may indicate carpal tunnel. She has already purchased a brace which I instructed her to wear at night and is taking NSAIDs as needed. Will continue conservative therapies for 4-6 weeks.

## 2020-03-01 DIAGNOSIS — Z8739 Personal history of other diseases of the musculoskeletal system and connective tissue: Secondary | ICD-10-CM | POA: Diagnosis not present

## 2020-03-01 NOTE — Progress Notes (Signed)
Internal Medicine Clinic Attending  Case discussed with Dr. Bloomfield at the time of the visit.  We reviewed the resident's history and exam and pertinent patient test results.  I agree with the assessment, diagnosis, and plan of care documented in the resident's note.  

## 2020-04-20 ENCOUNTER — Encounter: Payer: Self-pay | Admitting: Obstetrics and Gynecology

## 2020-04-20 ENCOUNTER — Encounter: Payer: Medicaid Other | Admitting: Obstetrics and Gynecology

## 2020-04-20 NOTE — Addendum Note (Signed)
Addended by: Neomia Dear on: 04/20/2020 07:49 PM   Modules accepted: Orders

## 2020-04-21 NOTE — Progress Notes (Signed)
Patient did not keep her GYN appointment for 04/20/2020.  Kayla Adkins, Jr MD Attending [facility])   

## 2020-06-03 ENCOUNTER — Encounter: Payer: Medicaid Other | Admitting: Internal Medicine

## 2020-06-08 ENCOUNTER — Ambulatory Visit: Payer: Medicaid Other | Admitting: Internal Medicine

## 2020-06-08 ENCOUNTER — Other Ambulatory Visit: Payer: Self-pay

## 2020-06-08 ENCOUNTER — Encounter: Payer: Self-pay | Admitting: Internal Medicine

## 2020-06-08 VITALS — BP 150/91 | HR 76 | Temp 98.6°F | Ht 66.0 in | Wt 207.3 lb

## 2020-06-08 DIAGNOSIS — M609 Myositis, unspecified: Secondary | ICD-10-CM | POA: Diagnosis not present

## 2020-06-08 DIAGNOSIS — I10 Essential (primary) hypertension: Secondary | ICD-10-CM | POA: Diagnosis not present

## 2020-06-08 DIAGNOSIS — G629 Polyneuropathy, unspecified: Secondary | ICD-10-CM | POA: Diagnosis not present

## 2020-06-08 LAB — GLUCOSE, CAPILLARY: Glucose-Capillary: 95 mg/dL (ref 70–99)

## 2020-06-08 LAB — POCT GLYCOSYLATED HEMOGLOBIN (HGB A1C): Hemoglobin A1C: 5.6 % (ref 4.0–5.6)

## 2020-06-08 MED ORDER — AMLODIPINE BESYLATE 5 MG PO TABS: 5.0000 mg | ORAL_TABLET | Freq: Every day | ORAL | 1 refills | Status: DC

## 2020-06-08 NOTE — Patient Instructions (Signed)
Ms. Haroon, It was a pleasure seeing you!  Today we discussed:  Blood pressure - I have sent in the Amlodipine for you to start taking again.   Numbness/tingling in your feet - I am ordering a few labs to look for an underlying cause and will let you know if we need to take any further steps once I have the results.   Otherwise, you're doing very well! I'll plan on seeing you in 6 months or so to see how things are going.   Take care,  Dr. Chesley Mires

## 2020-06-09 ENCOUNTER — Encounter: Payer: Self-pay | Admitting: Internal Medicine

## 2020-06-09 DIAGNOSIS — G629 Polyneuropathy, unspecified: Secondary | ICD-10-CM | POA: Insufficient documentation

## 2020-06-09 LAB — CK: Total CK: 120 U/L (ref 32–182)

## 2020-06-09 LAB — SEDIMENTATION RATE: Sed Rate: 25 mm/hr (ref 0–32)

## 2020-06-09 LAB — VITAMIN B12: Vitamin B-12: 367 pg/mL (ref 232–1245)

## 2020-06-09 LAB — TSH: TSH: 1.16 u[IU]/mL (ref 0.450–4.500)

## 2020-06-09 LAB — RPR: RPR Ser Ql: NONREACTIVE

## 2020-06-09 NOTE — Assessment & Plan Note (Signed)
Seen by rheumatology in March. Did not feel any further work-up was needed and advised her to slowly taper off Prednisone. She has felt much better since discontinuing prednisone. She has lost almost 25 pounds. Requests CK level be checked for her peace of mind since discontinuing Prednisone. CK level normal today.

## 2020-06-09 NOTE — Assessment & Plan Note (Signed)
Complains of 3 week history of bilateral numbness/tingling in her feet. Worse at night. Denies weakness. No numbness/tingling anywhere else.  Will screen for diabetes given her long-term steroid use. Will also check ESR, B12, RPR, TSH

## 2020-06-09 NOTE — Assessment & Plan Note (Signed)
Patient discontinued amlodipine once she tapered off steroid thinking she would no longer need it. Blood pressure is elevated to 150/90 today. She is asymptomatic. She is agreeable to going back on amlodipine 10 mg which previously had her well controlled.

## 2020-06-09 NOTE — Progress Notes (Signed)
Established Patient Office Visit  Subjective:  Patient ID: Kayla Adkins, female    DOB: November 22, 1981  Age: 39 y.o. MRN: [id number]  CC:  Chief Complaint  Patient presents with  . Follow-up    After stopping prednisone    HPI Kayla Adkins presents for follow-up on myositis since discontinuing prednisone, chronic HTN, and acute concern of numbness/tingling in her feet for the last 3-4 weeks.   Past Medical History:  Diagnosis Date  . Hypertension   . Rhabdomyolysis 07/16/2018  . Vertigo     Past Surgical History:  Procedure Laterality Date  . CESAREAN SECTION    . MUSCLE BIOPSY Right 07/24/2018   Procedure: MUSCLE BIOPSY THIGH;  Surgeon: Judeth Horn, MD;  Location: [facility];  Service: General;  Laterality: Right;    History reviewed. No pertinent family history.  Social History   Socioeconomic History  . Marital status: Married    Spouse name: Not on file  . Number of children: Not on file  . Years of education: Not on file  . Highest education level: Not on file  Occupational History  . Not on file  Tobacco Use  . Smoking status: Former Smoker    Packs/day: 0.25    Years: 2.00    Pack years: 0.50    Types: Cigarettes    Quit date: 08/05/2013    Years since quitting: 6.8  . Smokeless tobacco: Never Used  Substance and Sexual Activity  . Alcohol use: Yes    Comment: sometimes  . Drug use: No  . Sexual activity: Not on file  Other Topics Concern  . Not on file  Social History Narrative  . Not on file   Social Determinants of Health   Financial Resource Strain:   . Difficulty of Paying Living Expenses:   Food Insecurity:   . Worried About Charity fundraiser in the Last Year:   . Arboriculturist in the Last Year:   Transportation Needs:   . Film/video editor (Medical):   Marland Kitchen Lack of Transportation (Non-Medical):   Physical Activity:   . Days of Exercise per Week:   . Minutes of Exercise per Session:   Stress:   . Feeling of Stress :   Social  Connections:   . Frequency of Communication with Friends and Family:   . Frequency of Social Gatherings with Friends and Family:   . Attends Religious Services:   . Active Member of Clubs or Organizations:   . Attends Archivist Meetings:   Marland Kitchen Marital Status:   Intimate Partner Violence:   . Fear of Current or Ex-Partner:   . Emotionally Abused:   Marland Kitchen Physically Abused:   . Sexually Abused:     Outpatient Medications Prior to Visit  Medication Sig Dispense Refill  . acetaminophen (TYLENOL) 500 MG tablet Take 1 tablet (500 mg total) by mouth every 6 (six) hours as needed for mild pain. 30 tablet 0  . cyclobenzaprine (FLEXERIL) 10 MG tablet Take 1 tablet (10 mg total) by mouth 2 (two) times daily as needed for muscle spasms. 20 tablet 0  . famotidine (PEPCID) 20 MG tablet Take 1 tablet (20 mg total) by mouth 2 (two) times daily. 14 tablet 0  . HYDROcodone-acetaminophen (NORCO/VICODIN) 5-325 MG tablet Take 1 tablet by mouth every 4 (four) hours as needed. (Patient not taking: Reported on 07/16/2018) 10 tablet 0  . ibuprofen (ADVIL) 600 MG tablet Take 1 tablet (600 mg total) by mouth every 6 (  six) hours as needed. 20 tablet 0  . meclizine (ANTIVERT) 25 MG tablet Take 1 tablet (25 mg total) by mouth 3 (three) times daily as needed for dizziness. 30 tablet 0  . metoprolol tartrate (LOPRESSOR) 25 MG tablet Take 1 tablet (25 mg total) by mouth 2 (two) times daily. 60 tablet 2  . ondansetron (ZOFRAN ODT) 4 MG disintegrating tablet Take 1 tablet (4 mg total) by mouth every 8 (eight) hours as needed for nausea. 10 tablet 0  . ondansetron (ZOFRAN) 4 MG tablet Take 1 tablet (4 mg total) by mouth every 6 (six) hours. (Patient not taking: Reported on 07/16/2018) 12 tablet 0  . predniSONE (DELTASONE) 5 MG tablet Take 1 tablet (5 mg total) by mouth daily with breakfast. 60 tablet 1  . amLODipine (NORVASC) 5 MG tablet Take 1 tablet (5 mg total) by mouth daily. 90 tablet 1   No facility-administered  medications prior to visit.    No Known Allergies  ROS Review of Systems  Constitutional: Negative for appetite change, chills, fatigue and fever.  HENT: Negative for congestion, sinus pain and sore throat.   Eyes: Negative for visual disturbance.  Respiratory: Negative for cough and shortness of breath.   Cardiovascular: Negative for chest pain, palpitations and leg swelling.  Gastrointestinal: Negative for abdominal pain, constipation, diarrhea, nausea and vomiting.  Genitourinary: Negative for dysuria and hematuria.  Musculoskeletal: Negative for arthralgias and joint swelling.  Skin: Negative for rash.  Neurological: Negative for dizziness, weakness and headaches.  Hematological: Does not bruise/bleed easily.  Psychiatric/Behavioral: Negative for dysphoric mood and sleep disturbance.      Objective:    Physical Exam Constitutional:      General: She is not in acute distress.    Appearance: Normal appearance.  Eyes:     Conjunctiva/sclera: Conjunctivae normal.  Cardiovascular:     Rate and Rhythm: Normal rate and regular rhythm.  Pulmonary:     Effort: Pulmonary effort is normal.     Breath sounds: Normal breath sounds.  Abdominal:     General: There is no distension.     Palpations: Abdomen is soft.     Tenderness: There is no abdominal tenderness.  Musculoskeletal:     Cervical back: Neck supple.     Right lower leg: No edema.     Left lower leg: No edema.  Lymphadenopathy:     Cervical: No cervical adenopathy.  Skin:    General: Skin is warm and dry.  Neurological:     General: No focal deficit present.     Mental Status: She is alert and oriented to person, place, and time.     Sensory: No sensory deficit.     Gait: Gait normal.  Psychiatric:        Mood and Affect: Mood normal.        Behavior: Behavior normal.     BP (!) 150/91 (BP Location: Left Arm, Patient Position: Sitting, Cuff Size: Large)   Pulse 76   Temp 98.6 F (37 C) (Oral)   Ht '5\' 6"'   (1.676 m)   Wt 207 lb 4.8 oz (94 kg)   SpO2 100%   BMI 33.46 kg/m  Wt Readings from Last 3 Encounters:  06/08/20 207 lb 4.8 oz (94 kg)  02/29/20 230 lb 1.6 oz (104.4 kg)  02/26/20 220 lb (99.8 kg)     Health Maintenance Due  Topic Date Due  . COVID-19 Vaccine (1) Never done  . TETANUS/TDAP  Never done  .  PAP SMEAR-Modifier  Never done    There are no preventive care reminders to display for this patient.  Lab Results  Component Value Date   TSH 1.160 06/08/2020   Lab Results  Component Value Date   WBC 6.9 02/26/2020   HGB 12.4 02/26/2020   HCT 40.0 02/26/2020   MCV 94.8 02/26/2020   PLT 274 02/26/2020   Lab Results  Component Value Date   NA 140 02/26/2020   K 3.0 (L) 02/26/2020   CO2 22 02/26/2020   GLUCOSE 106 (H) 02/26/2020   BUN 9 02/26/2020   CREATININE 0.64 02/26/2020   BILITOT 0.2 05/19/2019   ALKPHOS 78 05/19/2019   AST 12 05/19/2019   ALT 13 05/19/2019   PROT 7.1 05/19/2019   ALBUMIN 4.5 05/19/2019   CALCIUM 8.8 (L) 02/26/2020   ANIONGAP 11 02/26/2020   No results found for: CHOL No results found for: HDL No results found for: LDLCALC No results found for: TRIG No results found for: CHOLHDL Lab Results  Component Value Date   HGBA1C 5.6 06/08/2020      Assessment & Plan:   Problem List Items Addressed This Visit      Cardiovascular and Mediastinum   Hypertension    Patient discontinued amlodipine once she tapered off steroid thinking she would no longer need it. Blood pressure is elevated to 150/90 today. She is asymptomatic. She is agreeable to going back on amlodipine 10 mg which previously had her well controlled.       Relevant Medications   amLODipine (NORVASC) 5 MG tablet     Nervous and Auditory   Neuropathy - Primary    Complains of 3 week history of bilateral numbness/tingling in her feet. Worse at night. Denies weakness. No numbness/tingling anywhere else.  Will screen for diabetes given her long-term steroid use. Will  also check ESR, B12, RPR, TSH       Relevant Orders   Vitamin B12 (Completed)   POC Hbg A1C (Completed)   TSH (Completed)   Sed Rate (ESR) (Completed)   RPR (Completed)     Musculoskeletal and Integument   Myositis    Seen by rheumatology in March. Did not feel any further work-up was needed and advised her to slowly taper off Prednisone. She has felt much better since discontinuing prednisone. She has lost almost 25 pounds. Requests CK level be checked for her peace of mind since discontinuing Prednisone. CK level normal today.       Relevant Orders   CK, total (Completed)      Meds ordered this encounter  Medications  . amLODipine (NORVASC) 5 MG tablet    Sig: Take 1 tablet (5 mg total) by mouth daily.    Dispense:  90 tablet    Refill:  1    Follow-up: Return in about 6 months (around 12/09/2020) for HTN .    Delice Bison, DO

## 2020-06-14 NOTE — Progress Notes (Signed)
Internal Medicine Clinic Attending  Case discussed with Dr. Bloomfield  At the time of the visit.  We reviewed the resident's history and exam and pertinent patient test results.  I agree with the assessment, diagnosis, and plan of care documented in the resident's note.  

## 2020-06-15 ENCOUNTER — Telehealth: Payer: Self-pay

## 2020-06-15 NOTE — Telephone Encounter (Signed)
Patient was called and informed of normal lab results.   Numbness/tingling in her feet had resolved after resuming blood pressure medication.

## 2020-06-15 NOTE — Telephone Encounter (Signed)
Requesting test results, please call pt back.  

## 2020-07-18 ENCOUNTER — Encounter: Payer: Medicaid Other | Admitting: Obstetrics and Gynecology

## 2020-08-29 ENCOUNTER — Encounter: Payer: Medicaid Other | Admitting: Internal Medicine

## 2020-09-02 ENCOUNTER — Encounter: Payer: Self-pay | Admitting: Internal Medicine

## 2020-09-02 ENCOUNTER — Ambulatory Visit (INDEPENDENT_AMBULATORY_CARE_PROVIDER_SITE_OTHER): Payer: Medicaid Other | Admitting: Internal Medicine

## 2020-09-02 ENCOUNTER — Other Ambulatory Visit: Payer: Self-pay

## 2020-09-02 VITALS — BP 130/89 | HR 82 | Temp 98.2°F | Ht 66.0 in | Wt 207.5 lb

## 2020-09-02 DIAGNOSIS — G629 Polyneuropathy, unspecified: Secondary | ICD-10-CM | POA: Diagnosis not present

## 2020-09-02 DIAGNOSIS — R5383 Other fatigue: Secondary | ICD-10-CM

## 2020-09-02 MED ORDER — METOPROLOL TARTRATE 25 MG PO TABS: 25.0000 mg | ORAL_TABLET | Freq: Two times a day (BID) | ORAL | 2 refills | Status: DC

## 2020-09-02 MED ORDER — AMLODIPINE BESYLATE 5 MG PO TABS: 5.0000 mg | ORAL_TABLET | Freq: Every day | ORAL | 1 refills | Status: DC

## 2020-09-02 NOTE — Patient Instructions (Signed)
Ms. Jauregui, It was nice seeing you again. Sorry to hear you are still having a difficult time with fatigue and pain in your legs/feet. I will check some labs again today to ensure they are normal. I also will send you fro nerve conduction testing to make sure it is not a nerve issue.   Please follow-up with your rheumatologist to also discuss your symptoms. Phone number is: [phone]   I'll let you know when I have these results back.  Take care, Dr. Chesley Mires

## 2020-09-03 LAB — BMP8+ANION GAP
Anion Gap: 13 mmol/L (ref 10.0–18.0)
BUN/Creatinine Ratio: 9 (ref 9–23)
BUN: 6 mg/dL (ref 6–24)
CO2: 24 mmol/L (ref 20–29)
Calcium: 9.3 mg/dL (ref 8.7–10.2)
Chloride: 101 mmol/L (ref 96–106)
Creatinine, Ser: 0.69 mg/dL (ref 0.57–1.00)
GFR calc Af Amer: 126 mL/min/{1.73_m2} (ref >59)
GFR calc non Af Amer: 109 mL/min/{1.73_m2} (ref >59)
Glucose: 92 mg/dL (ref 65–99)
Potassium: 4.1 mmol/L (ref 3.5–5.2)
Sodium: 138 mmol/L (ref 134–144)

## 2020-09-03 LAB — CK: Total CK: 104 U/L (ref 32–182)

## 2020-09-03 LAB — SEDIMENTATION RATE: Sed Rate: 37 mm/hr — ABNORMAL HIGH (ref 0–32)

## 2020-09-04 ENCOUNTER — Encounter: Payer: Self-pay | Admitting: Internal Medicine

## 2020-09-04 NOTE — Assessment & Plan Note (Addendum)
Patient presents with 5 month history of fatigue and sensation that she is walking on nails when she first tries to get up and walk. 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 She presented with similar symptoms 3 months ago, at which time CBC, TSH, B12, RPR, A1C, sed rate and CK were unremarkable.  Repeat CK and BMP unremarkable. Sed rate mildly elevated. Neurologic exam reassuring on exam.  She is due to follow-up with rheumatologist this month so will have her discuss symptoms with them since onset was so soon after stopping steroids.  Will also send for nerve conduction testing to determine if her symptoms are due to a true neuropathy or not.

## 2020-09-04 NOTE — Progress Notes (Signed)
Acute Office Visit  Subjective:    Patient ID: Kayla Adkins, female    DOB: May 28, 1981, 39 y.o.   MRN: [id number]  Chief Complaint  Patient presents with  . Pain    both feet  . Fatigue    Since stopping Prednisone.  . Medication Refill    B/P Med     HPI Patient is in today for fatigue and pain in both feet for the last 4-5 months. Please see problem based charting for further details.   Past Medical History:  Diagnosis Date  . Hypertension   . Rhabdomyolysis 07/16/2018  . Vertigo     Past Surgical History:  Procedure Laterality Date  . CESAREAN SECTION    . MUSCLE BIOPSY Right 07/24/2018   Procedure: MUSCLE BIOPSY THIGH;  Surgeon: Judeth Horn, MD;  Location: [place];  Service: General;  Laterality: Right;    No family history on file.  Social History   Socioeconomic History  . Marital status: Married    Spouse name: Not on file  . Number of children: Not on file  . Years of education: Not on file  . Highest education level: Not on file  Occupational History  . Not on file  Tobacco Use  . Smoking status: Former Smoker    Packs/day: 0.25    Years: 2.00    Pack years: 0.50    Types: Cigarettes    Quit date: 08/05/2013    Years since quitting: 7.0  . Smokeless tobacco: Never Used  Substance and Sexual Activity  . Alcohol use: Yes    Comment: sometimes  . Drug use: No  . Sexual activity: Not on file  Other Topics Concern  . Not on file  Social History Narrative  . Not on file   Social Determinants of Health   Financial Resource Strain:   . Difficulty of Paying Living Expenses: Not on file  Food Insecurity:   . Worried About Charity fundraiser in the Last Year: Not on file  . Ran Out of Food in the Last Year: Not on file  Transportation Needs:   . Lack of Transportation (Medical): Not on file  . Lack of Transportation (Non-Medical): Not on file  Physical Activity:   . Days of Exercise per Week: Not on file  . Minutes of Exercise per Session:  Not on file  Stress:   . Feeling of Stress : Not on file  Social Connections:   . Frequency of Communication with Friends and Family: Not on file  . Frequency of Social Gatherings with Friends and Family: Not on file  . Attends Religious Services: Not on file  . Active Member of Clubs or Organizations: Not on file  . Attends Archivist Meetings: Not on file  . Marital Status: Not on file  Intimate Partner Violence:   . Fear of Current or Ex-Partner: Not on file  . Emotionally Abused: Not on file  . Physically Abused: Not on file  . Sexually Abused: Not on file    Outpatient Medications Prior to Visit  Medication Sig Dispense Refill  . acetaminophen (TYLENOL) 500 MG tablet Take 1 tablet (500 mg total) by mouth every 6 (six) hours as needed for mild pain. 30 tablet 0  . cyclobenzaprine (FLEXERIL) 10 MG tablet Take 1 tablet (10 mg total) by mouth 2 (two) times daily as needed for muscle spasms. 20 tablet 0  . famotidine (PEPCID) 20 MG tablet Take 1 tablet (20 mg total) by mouth  2 (two) times daily. 14 tablet 0  . HYDROcodone-acetaminophen (NORCO/VICODIN) 5-325 MG tablet Take 1 tablet by mouth every 4 (four) hours as needed. (Patient not taking: Reported on 07/16/2018) 10 tablet 0  . ibuprofen (ADVIL) 600 MG tablet Take 1 tablet (600 mg total) by mouth every 6 (six) hours as needed. 20 tablet 0  . meclizine (ANTIVERT) 25 MG tablet Take 1 tablet (25 mg total) by mouth 3 (three) times daily as needed for dizziness. 30 tablet 0  . ondansetron (ZOFRAN ODT) 4 MG disintegrating tablet Take 1 tablet (4 mg total) by mouth every 8 (eight) hours as needed for nausea. 10 tablet 0  . ondansetron (ZOFRAN) 4 MG tablet Take 1 tablet (4 mg total) by mouth every 6 (six) hours. (Patient not taking: Reported on 07/16/2018) 12 tablet 0  . predniSONE (DELTASONE) 5 MG tablet Take 1 tablet (5 mg total) by mouth daily with breakfast. 60 tablet 1  . amLODipine (NORVASC) 5 MG tablet Take 1 tablet (5 mg total)  by mouth daily. 90 tablet 1  . metoprolol tartrate (LOPRESSOR) 25 MG tablet Take 1 tablet (25 mg total) by mouth 2 (two) times daily. 60 tablet 2   No facility-administered medications prior to visit.    No Known Allergies  Review of Systems  Constitutional: Negative for fever and unexpected weight change.  Respiratory: Negative for shortness of breath.   Cardiovascular: Negative for chest pain.  Gastrointestinal: Negative for constipation, diarrhea and nausea.  Musculoskeletal: Negative for arthralgias, back pain, joint swelling and neck pain.  Skin: Negative for color change and rash.  Neurological: Negative for dizziness, weakness, numbness and headaches.       Objective:    Physical Exam  BP 130/89 (Cuff Size: Large)   Pulse 82   Temp 98.2 F (36.8 C) (Oral)   Ht _0  (1.676 m)   Wt 207 lb 8 oz (94.1 kg)   LMP 08/10/2020   SpO2 100% Comment: room air  BMI 33.49 kg/m  Wt Readings from Last 3 Encounters:  09/02/20 207 lb 8 oz (94.1 kg)  06/08/20 207 lb 4.8 oz (94 kg)  02/29/20 230 lb 1.6 oz (104.4 kg)    Health Maintenance Due  Topic Date Due  . COVID-19 Vaccine (1) Never done  . TETANUS/TDAP  Never done  . PAP SMEAR-Modifier  Never done  . INFLUENZA VACCINE  07/03/2020    There are no preventive care reminders to display for this patient.   Lab Results  Component Value Date   TSH 1.160 06/08/2020   Lab Results  Component Value Date   WBC 6.9 02/26/2020   HGB 12.4 02/26/2020   HCT 40.0 02/26/2020   MCV 94.8 02/26/2020   PLT 274 02/26/2020   Lab Results  Component Value Date   NA 138 09/02/2020   K 4.1 09/02/2020   CO2 24 09/02/2020   GLUCOSE 92 09/02/2020   BUN 6 09/02/2020   CREATININE 0.69 09/02/2020   BILITOT 0.2 05/19/2019   ALKPHOS 78 05/19/2019   AST 12 05/19/2019   ALT 13 05/19/2019   PROT 7.1 05/19/2019   ALBUMIN 4.5 05/19/2019   CALCIUM 9.3 09/02/2020   ANIONGAP 11 02/26/2020   No results found for: CHOL No results found for:  HDL No results found for: LDLCALC No results found for: TRIG No results found for: [facility] Lab Results  Component Value Date   HGBA1C 5.6 06/08/2020       Assessment & Plan:   Problem List  Items Addressed This Visit      Nervous and Auditory   Neuropathy - Primary   Relevant Orders   BMP8+Anion Gap (Completed)   CK, total (Completed)   Sed Rate (ESR) (Completed)   NCV with EMG(electromyography)       Meds ordered this encounter  Medications  . amLODipine (NORVASC) 5 MG tablet    Sig: Take 1 tablet (5 mg total) by mouth daily.    Dispense:  90 tablet    Refill:  1  . metoprolol tartrate (LOPRESSOR) 25 MG tablet    Sig: Take 1 tablet (25 mg total) by mouth 2 (two) times daily.    Dispense:  60 tablet    Refill:  2     Malaysha Arlen D Nechemia Chiappetta, DO

## 2020-09-09 NOTE — Progress Notes (Signed)
Internal Medicine Clinic Attending  Case discussed with Dr. Bloomfield  At the time of the visit.  We reviewed the resident's history and exam and pertinent patient test results.  I agree with the assessment, diagnosis, and plan of care documented in the resident's note.  

## 2021-01-19 ENCOUNTER — Other Ambulatory Visit: Payer: Self-pay | Admitting: Internal Medicine

## 2021-01-31 ENCOUNTER — Encounter: Payer: Medicaid Other | Admitting: Internal Medicine

## 2021-01-31 NOTE — Progress Notes (Deleted)
   Office Visit   Patient ID: Kayla Adkins, female    DOB: [birth year], 40 y.o.   MRN: [id number]  Subjective:  CC: chronic hypertension, neuropathy  HPI 40 y.o. presents today for follow up of her chronic medical conditions.  Neuropathy LOV 09/02/21 with Dr. Bloomfield--recommended rheum follow up and nerve conduction testing      ACTIVE MEDICATIONS   Outpatient Medications Prior to Visit  Medication Sig Dispense Refill  . acetaminophen (TYLENOL) 500 MG tablet Take 1 tablet (500 mg total) by mouth every 6 (six) hours as needed for mild pain. 30 tablet 0  . amLODipine (NORVASC) 5 MG tablet Take 1 tablet (5 mg total) by mouth daily. 90 tablet 1  . cyclobenzaprine (FLEXERIL) 10 MG tablet Take 1 tablet (10 mg total) by mouth 2 (two) times daily as needed for muscle spasms. 20 tablet 0  . famotidine (PEPCID) 20 MG tablet Take 1 tablet (20 mg total) by mouth 2 (two) times daily. 14 tablet 0  . HYDROcodone-acetaminophen (NORCO/VICODIN) 5-325 MG tablet Take 1 tablet by mouth every 4 (four) hours as needed. (Patient not taking: Reported on 07/16/2018) 10 tablet 0  . ibuprofen (ADVIL) 600 MG tablet Take 1 tablet (600 mg total) by mouth every 6 (six) hours as needed. 20 tablet 0  . meclizine (ANTIVERT) 25 MG tablet Take 1 tablet (25 mg total) by mouth 3 (three) times daily as needed for dizziness. 30 tablet 0  . metoprolol tartrate (LOPRESSOR) 25 MG tablet TAKE 1 TABLET(25 MG) BY MOUTH TWICE DAILY 60 tablet 5  . ondansetron (ZOFRAN ODT) 4 MG disintegrating tablet Take 1 tablet (4 mg total) by mouth every 8 (eight) hours as needed for nausea. 10 tablet 0  . ondansetron (ZOFRAN) 4 MG tablet Take 1 tablet (4 mg total) by mouth every 6 (six) hours. (Patient not taking: Reported on 07/16/2018) 12 tablet 0  . predniSONE (DELTASONE) 5 MG tablet Take 1 tablet (5 mg total) by mouth daily with breakfast. 60 tablet 1   No facility-administered medications prior to visit.     ROS  Review of  Systems  Objective:   There were no vitals taken for this visit. Wt Readings from Last 3 Encounters:  09/02/20 207 lb 8 oz (94.1 kg)  06/08/20 207 lb 4.8 oz (94 kg)  02/29/20 230 lb 1.6 oz (104.4 kg)   BP Readings from Last 3 Encounters:  09/02/20 130/89  06/08/20 (!) 150/91  02/29/20 126/79   Physical Exam  Health Maintenance:   Health Maintenance  Topic Date Due  . COVID-19 Vaccine (1) Never done  . TETANUS/TDAP  Never done  . PAP SMEAR-Modifier  Never done  . INFLUENZA VACCINE  07/03/2020  . Hepatitis C Screening  Completed  . HIV Screening  Completed  . HPV VACCINES  Aged Out     Assessment & Plan:   Problem List Items Addressed This Visit   None       Pt discussed with ***  Elige Radon, MD Internal Medicine Resident PGY-2 [facility] Pager: [phone] 01/31/2021 12:54 PM

## 2021-02-01 ENCOUNTER — Encounter: Payer: Medicaid Other | Admitting: Internal Medicine

## 2021-02-01 ENCOUNTER — Telehealth: Payer: Self-pay | Admitting: *Deleted

## 2021-02-01 NOTE — Telephone Encounter (Signed)
Call placed to patient for today's missed appt. Recording states number is not in service.

## 2021-05-12 ENCOUNTER — Encounter: Payer: Self-pay | Admitting: *Deleted

## 2021-05-17 ENCOUNTER — Encounter: Payer: Medicaid Other | Admitting: Internal Medicine

## 2021-08-27 NOTE — Progress Notes (Deleted)
GYNECOLOGY ANNUAL PREVENTATIVE CARE ENCOUNTER NOTE  History:     Kaylise Blakeley is a 40 y.o. No obstetric history on file. female here for a routine annual gynecologic exam.  Current complaints: ***.      Denies abnormal vaginal bleeding, discharge, pelvic pain, problems with intercourse or other gynecologic concerns.      Gynecologic History No LMP recorded. Contraception: {method:5051} Last Pap: Never done per pt (and chart review) Last Mammogram: not yet done  Obstetric History OB History  No obstetric history on file.    Past Medical History:  Diagnosis Date   Hypertension    Rhabdomyolysis 07/16/2018   Vertigo     Past Surgical History:  Procedure Laterality Date   CESAREAN SECTION     MUSCLE BIOPSY Right 07/24/2018   Procedure: MUSCLE BIOPSY THIGH;  Surgeon: Jimmye Norman, MD;  Location: [facility];  Service: General;  Laterality: Right;    Current Outpatient Medications on File Prior to Visit  Medication Sig Dispense Refill   acetaminophen (TYLENOL) 500 MG tablet Take 1 tablet (500 mg total) by mouth every 6 (six) hours as needed for mild pain. 30 tablet 0   amLODipine (NORVASC) 5 MG tablet Take 1 tablet (5 mg total) by mouth daily. 90 tablet 1   cyclobenzaprine (FLEXERIL) 10 MG tablet Take 1 tablet (10 mg total) by mouth 2 (two) times daily as needed for muscle spasms. 20 tablet 0   famotidine (PEPCID) 20 MG tablet Take 1 tablet (20 mg total) by mouth 2 (two) times daily. 14 tablet 0   HYDROcodone-acetaminophen (NORCO/VICODIN) 5-325 MG tablet Take 1 tablet by mouth every 4 (four) hours as needed. (Patient not taking: Reported on 07/16/2018) 10 tablet 0   ibuprofen (ADVIL) 600 MG tablet Take 1 tablet (600 mg total) by mouth every 6 (six) hours as needed. 20 tablet 0   meclizine (ANTIVERT) 25 MG tablet Take 1 tablet (25 mg total) by mouth 3 (three) times daily as needed for dizziness. 30 tablet 0   metoprolol tartrate (LOPRESSOR) 25 MG tablet TAKE 1 TABLET(25 MG) BY  MOUTH TWICE DAILY 60 tablet 5   ondansetron (ZOFRAN ODT) 4 MG disintegrating tablet Take 1 tablet (4 mg total) by mouth every 8 (eight) hours as needed for nausea. 10 tablet 0   ondansetron (ZOFRAN) 4 MG tablet Take 1 tablet (4 mg total) by mouth every 6 (six) hours. (Patient not taking: Reported on 07/16/2018) 12 tablet 0   predniSONE (DELTASONE) 5 MG tablet Take 1 tablet (5 mg total) by mouth daily with breakfast. 60 tablet 1   No current facility-administered medications on file prior to visit.    No Known Allergies  Social History:  reports that she quit smoking about 8 years ago. Her smoking use included cigarettes. She has a 0.50 pack-year smoking history. She has never used smokeless tobacco. She reports current alcohol use. She reports that she does not use drugs.  No family history on file.  The following portions of the patient's history were reviewed and updated as appropriate: allergies, current medications, past family history, past medical history, past social history, past surgical history and problem list.  Review of Systems Pertinent items noted in HPI and remainder of comprehensive ROS otherwise negative.  Physical Exam:  There were no vitals taken for this visit. CONSTITUTIONAL: Well-developed, well-nourished female in no acute distress.  HENT:  Normocephalic, atraumatic, External right and left ear normal.  EYES: Conjunctivae and EOM are normal. Pupils are equal, round, and  reactive to light. No scleral icterus.  NECK: Normal range of motion, supple, no masses.  Normal thyroid.  SKIN: Skin is warm and dry. No rash noted. Not diaphoretic. No erythema. No pallor. MUSCULOSKELETAL: Normal range of motion. No tenderness.  No cyanosis, clubbing, or edema. NEUROLOGIC: Alert and oriented to person, place, and time. Normal reflexes, muscle tone coordination.  PSYCHIATRIC: Normal mood and affect. Normal behavior. Normal judgment and thought content.  CARDIOVASCULAR: Normal  heart rate noted, regular rhythm RESPIRATORY: Clear to auscultation bilaterally. Effort and breath sounds normal, no problems with respiration noted.  BREASTS: Symmetric in size. No masses, tenderness, skin changes, nipple drainage, or lymphadenopathy bilaterally. Performed in the presence of a chaperone. ABDOMEN: Soft, no distention noted.  No tenderness, rebound or guarding.  PELVIC: {PELVIC:22980}. Performed in the presence of a chaperone.    Assessment and Plan:    1. Encounter for annual routine gynecological examination - Cervical cancer screening: Discussed guidelines. Pap with HPV done  - Gardasil: {Blank single:19197::"completed","has not yet had. Counseling provided and she declines","Has not yet had. Counseling provided and pt accepts"} - STD Testing: *** - Birth Control: Discussed options and their risks, benefits and common side effects; discussed VTE with estrogen containing options. Desires: {Birth control type:23956} - Breast Health: Encouraged self breast awareness/SBE. Teaching provided. Discussed limits of clinical breast exam for detecting breast cancer. Rx given for MXR - F/U 12 months and prn   2. Encounter for screening for malignant neoplasm of breast, unspecified screening modality - rx for mxr given     Routine preventative health maintenance measures emphasized. Please refer to After Visit Summary for other counseling recommendations.      Milas Hock, MD, FACOG Obstetrician & Gynecologist, [facility], [facility]

## 2021-08-29 ENCOUNTER — Ambulatory Visit: Payer: Medicaid Other | Admitting: Obstetrics and Gynecology

## 2021-08-29 DIAGNOSIS — Z1239 Encounter for other screening for malignant neoplasm of breast: Secondary | ICD-10-CM

## 2021-08-29 DIAGNOSIS — Z01419 Encounter for gynecological examination (general) (routine) without abnormal findings: Secondary | ICD-10-CM

## 2021-08-30 ENCOUNTER — Telehealth: Payer: Self-pay

## 2021-08-30 NOTE — Telephone Encounter (Signed)
Pt is requesting a call back .. pt stated that when she woke up the ( left ) side of her face is swollen ..  pt statd that she did not eat anything new yesterday , nor take any new medication

## 2021-08-30 NOTE — Telephone Encounter (Signed)
RTC, patient c/o left jaw swelling.  States she has broken teeth on the left side and she needs to see a dentist, but wants to to be evaluated for need for ABX until she can get in with a dentist. RN asked pt if she is experiencing any teeth or jaw pain or drainage from teeth or gums, she states no. Appt made for tomorrow morning 08/31/21 0845 w/ yellow team, Dr. Loleta Books, RN,BSN

## 2021-08-31 ENCOUNTER — Other Ambulatory Visit: Payer: Self-pay

## 2021-08-31 ENCOUNTER — Encounter: Payer: Self-pay | Admitting: Internal Medicine

## 2021-08-31 ENCOUNTER — Ambulatory Visit (INDEPENDENT_AMBULATORY_CARE_PROVIDER_SITE_OTHER): Payer: Medicaid Other | Admitting: Internal Medicine

## 2021-08-31 VITALS — BP 148/83 | HR 94 | Temp 98.2°F | Ht 66.0 in | Wt 187.0 lb

## 2021-08-31 DIAGNOSIS — M272 Inflammatory conditions of jaws: Secondary | ICD-10-CM

## 2021-08-31 DIAGNOSIS — R252 Cramp and spasm: Secondary | ICD-10-CM

## 2021-08-31 DIAGNOSIS — M609 Myositis, unspecified: Secondary | ICD-10-CM

## 2021-08-31 DIAGNOSIS — Z0000 Encounter for general adult medical examination without abnormal findings: Secondary | ICD-10-CM

## 2021-08-31 DIAGNOSIS — Z23 Encounter for immunization: Secondary | ICD-10-CM | POA: Diagnosis not present

## 2021-08-31 DIAGNOSIS — I10 Essential (primary) hypertension: Secondary | ICD-10-CM

## 2021-08-31 MED ORDER — AMOXICILLIN-POT CLAVULANATE 875-125 MG PO TABS: 1.0000 | ORAL_TABLET | Freq: Two times a day (BID) | ORAL | 0 refills | Status: AC

## 2021-08-31 MED ORDER — AMLODIPINE BESYLATE 5 MG PO TABS: 5.0000 mg | ORAL_TABLET | Freq: Every day | ORAL | 1 refills | Status: DC

## 2021-08-31 MED ORDER — METOPROLOL TARTRATE 25 MG PO TABS: ORAL_TABLET | ORAL | 5 refills | Status: DC

## 2021-08-31 NOTE — Progress Notes (Signed)
   CC: swollen jaw and broken teeth  HPI:[name] is a 40 y.o. female who presents for evaluation of swollen jaw and broken teeth. Please see individual problem based A/P for details.  Please see encounters tab for problem based charting.  Depression, PHQ-9: Based on the patients  Flowsheet Row Office Visit from 08/31/2021 in [facility]  PHQ-9 Total Score 0      score we have 0.  Past Medical History:  Diagnosis Date   Hypertension    Rhabdomyolysis 07/16/2018   Vertigo    Review of Systems:   Review of Systems  Constitutional: Negative.   HENT: Negative.    Eyes: Negative.   Respiratory: Negative.    Cardiovascular: Negative.   Gastrointestinal: Negative.   Genitourinary: Negative.   Musculoskeletal: Negative.   Skin: Negative.     Physical Exam: Vitals:   08/31/21 0845  BP: (!) 148/83  Pulse: 94  Temp: 98.2 F (36.8 C)  TempSrc: Oral  SpO2: 100%  Weight: 187 lb (84.8 kg)  Height: 5\' 6"  (1.676 m)     General: alert and oriented, no acute distress. afebrile HEENT: Conjunctiva nl , antiicteric sclerae, moist. Soft tissue swelling extending from below left eye to maxilla. No pain with palpation. No crepitus or purulent drainage noted. Extra ocular movements intact. Airway clear, patient able to speak in full sentences and swallow without difficulty. mucous membranes, no exudate or erythema Cardiovascular: Normal rate, regular rhythm.  No murmurs, rubs, or gallops Pulmonary : Equal breath sounds, No wheezes, rales, or rhonchi Abdominal: soft, nontender,  bowel sounds present Ext: No edema in lower extremities, no tenderness to palpation of lower extremities.   Assessment & Plan:   See Encounters Tab for problem based charting.  Patient seen with Dr. 

## 2021-08-31 NOTE — Patient Instructions (Addendum)
Dear Mrs. Derrell Lolling,  Today we discussed your facial swelling. We will start you on Augmentin 875mg  to take twice daily for 7 days. This can help with the infection/inflammation, but it will be important to follow up with a dentist.   Please restart taking your blood pressure medications.

## 2021-09-01 LAB — CBC WITH DIFFERENTIAL/PLATELET
Basophils Absolute: 0 10*3/uL (ref 0.0–0.2)
Basos: 1 %
EOS (ABSOLUTE): 0.1 10*3/uL (ref 0.0–0.4)
Eos: 2 %
Hematocrit: 33.6 % — ABNORMAL LOW (ref 34.0–46.6)
Hemoglobin: 11 g/dL — ABNORMAL LOW (ref 11.1–15.9)
Immature Grans (Abs): 0 10*3/uL (ref 0.0–0.1)
Immature Granulocytes: 0 %
Lymphocytes Absolute: 1.9 10*3/uL (ref 0.7–3.1)
Lymphs: 43 %
MCH: 28.7 pg (ref 26.6–33.0)
MCHC: 32.7 g/dL (ref 31.5–35.7)
MCV: 88 fL (ref 79–97)
Monocytes Absolute: 0.2 10*3/uL (ref 0.1–0.9)
Monocytes: 5 %
Neutrophils Absolute: 2.2 10*3/uL (ref 1.4–7.0)
Neutrophils: 49 %
Platelets: 324 10*3/uL (ref 150–450)
RBC: 3.83 x10E6/uL (ref 3.77–5.28)
RDW: 15.8 % — ABNORMAL HIGH (ref 11.7–15.4)
WBC: 4.5 10*3/uL (ref 3.4–10.8)

## 2021-09-01 LAB — CK: Total CK: 142 U/L (ref 32–182)

## 2021-09-01 LAB — BMP8+ANION GAP
Anion Gap: 14 mmol/L (ref 10.0–18.0)
BUN/Creatinine Ratio: 10 (ref 9–23)
BUN: 8 mg/dL (ref 6–24)
CO2: 23 mmol/L (ref 20–29)
Calcium: 9.6 mg/dL (ref 8.7–10.2)
Chloride: 104 mmol/L (ref 96–106)
Creatinine, Ser: 0.79 mg/dL (ref 0.57–1.00)
Glucose: 93 mg/dL (ref 70–99)
Potassium: 3.8 mmol/L (ref 3.5–5.2)
Sodium: 141 mmol/L (ref 134–144)
eGFR: 96 mL/min/{1.73_m2} (ref >59)

## 2021-09-01 LAB — LIPID PANEL
Chol/HDL Ratio: 3.4 ratio (ref 0.0–4.4)
Cholesterol, Total: 158 mg/dL (ref 100–199)
HDL: 46 mg/dL (ref >39)
LDL Chol Calc (NIH): 98 mg/dL (ref 0–99)
Triglycerides: 74 mg/dL (ref 0–149)
VLDL Cholesterol Cal: 14 mg/dL (ref 5–40)

## 2021-09-10 ENCOUNTER — Encounter: Payer: Self-pay | Admitting: Internal Medicine

## 2021-09-10 DIAGNOSIS — Z0000 Encounter for general adult medical examination without abnormal findings: Secondary | ICD-10-CM | POA: Insufficient documentation

## 2021-09-10 DIAGNOSIS — M272 Inflammatory conditions of jaws: Secondary | ICD-10-CM | POA: Insufficient documentation

## 2021-09-10 NOTE — Assessment & Plan Note (Signed)
Pt reports she awoke with swelling of left side of face from below her eye to her jaw. Endorsing discomfort in broke tooth which started yesterday and worsens with eating. Has tried tylenol and benadryl to help with discomfort but not noticed improvement. Some increased clear nasal discharge from left, no drainage from tooth. Some changes in sensation down to left side of neck, no pain with swallowing, no trouble swallowing or breathing. No fever or chills. No new foods or medications, no hx trauma - edema and erythema, area of induration and edema located at broken tooth - given dentist options for broken teeth.  - abx, dentist

## 2021-09-10 NOTE — Assessment & Plan Note (Signed)
Patient reports she has had new active lifestyle and some crmaping in legs - requesting CK chescked

## 2021-09-10 NOTE — Assessment & Plan Note (Signed)
Flu shot Med refill

## 2021-09-10 NOTE — Assessment & Plan Note (Signed)
HTN 148/83, did not take meds, wanted to see without meds. asymptomatic Amlodipine 5mg 

## 2021-09-11 NOTE — Progress Notes (Signed)
Internal Medicine Clinic Attending  I saw and evaluated the patient.  I personally confirmed the key portions of the history and exam documented by Dr. Gawaluck and I reviewed pertinent patient test results.  The assessment, diagnosis, and plan were formulated together and I agree with the documentation in the resident's note.  

## 2021-09-12 ENCOUNTER — Other Ambulatory Visit (HOSPITAL_BASED_OUTPATIENT_CLINIC_OR_DEPARTMENT_OTHER): Payer: Self-pay | Admitting: Internal Medicine

## 2021-09-12 DIAGNOSIS — Z0000 Encounter for general adult medical examination without abnormal findings: Secondary | ICD-10-CM

## 2021-10-02 ENCOUNTER — Inpatient Hospital Stay (HOSPITAL_BASED_OUTPATIENT_CLINIC_OR_DEPARTMENT_OTHER): Admission: RE | Admit: 2021-10-02 | Payer: Medicaid Other | Source: Ambulatory Visit

## 2021-10-03 ENCOUNTER — Telehealth (HOSPITAL_BASED_OUTPATIENT_CLINIC_OR_DEPARTMENT_OTHER): Payer: Self-pay

## 2021-11-02 ENCOUNTER — Encounter: Payer: Medicaid Other | Admitting: Internal Medicine

## 2021-11-03 ENCOUNTER — Telehealth: Payer: Self-pay

## 2021-11-03 NOTE — Telephone Encounter (Signed)
Requesting to speak with a nurse about getting antibiotic for tooth pain. Please call pt back.

## 2021-11-03 NOTE — Telephone Encounter (Signed)
Returned call to patient. She was seen on 9/29 for tooth pain and swelling and given antibiotic. She got better but now left side is swelling again and "discomfort" is back. Appt with Dentist is not till 12/23. She is requesting refill on antibiotic at [facility].

## 2021-11-07 NOTE — Telephone Encounter (Signed)
She is coming in [place] examine her then and send some in if need be. Thank you!

## 2021-11-10 ENCOUNTER — Encounter: Payer: Medicaid Other | Admitting: Internal Medicine

## 2021-12-05 ENCOUNTER — Inpatient Hospital Stay (HOSPITAL_BASED_OUTPATIENT_CLINIC_OR_DEPARTMENT_OTHER): Admission: RE | Admit: 2021-12-05 | Payer: Medicaid Other | Source: Ambulatory Visit

## 2022-03-08 ENCOUNTER — Other Ambulatory Visit: Payer: Self-pay

## 2022-03-08 ENCOUNTER — Ambulatory Visit: Payer: Medicaid Other | Admitting: Internal Medicine

## 2022-03-08 ENCOUNTER — Encounter: Payer: Self-pay | Admitting: Internal Medicine

## 2022-03-08 VITALS — BP 135/86 | HR 79 | Temp 98.4°F | Resp 24 | Ht 66.0 in | Wt 185.0 lb

## 2022-03-08 DIAGNOSIS — R42 Dizziness and giddiness: Secondary | ICD-10-CM

## 2022-03-08 DIAGNOSIS — Z124 Encounter for screening for malignant neoplasm of cervix: Secondary | ICD-10-CM

## 2022-03-08 DIAGNOSIS — I10 Essential (primary) hypertension: Secondary | ICD-10-CM | POA: Diagnosis not present

## 2022-03-08 DIAGNOSIS — M272 Inflammatory conditions of jaws: Secondary | ICD-10-CM | POA: Diagnosis not present

## 2022-03-08 DIAGNOSIS — Z0000 Encounter for general adult medical examination without abnormal findings: Secondary | ICD-10-CM

## 2022-03-08 MED ORDER — MECLIZINE HCL 25 MG PO TABS: 25.0000 mg | ORAL_TABLET | Freq: Three times a day (TID) | ORAL | 0 refills | Status: DC | PRN

## 2022-03-08 MED ORDER — AMOXICILLIN-POT CLAVULANATE 875-125 MG PO TABS: 1.0000 | ORAL_TABLET | Freq: Two times a day (BID) | ORAL | 0 refills | Status: AC

## 2022-03-08 NOTE — Assessment & Plan Note (Signed)
BP near goal of <130, however patient in pain today due to toothache. Will continue current treatment and recheck at next visit. ?- continue amlodipine 10 metoprolol 25 ?

## 2022-03-08 NOTE — Progress Notes (Signed)
? ?  CC: tooth pain ? ?HPI: ? ?[name] is a 41 y.o. PMH noted below, who presents to the [facility] with complaints of tooth pain. To see the management of his acute and chronic conditions, please refer to the A&P note under the encounters tab.  ? ?Past Medical History:  ?Diagnosis Date  ? Hypertension   ? Rhabdomyolysis 07/16/2018  ? Vertigo   ? ?Review of Systems:  positive for facial swelling, tooth pain, negative for dysphagia, odynophagia, shortness of breath, fevers, chills ? ?Physical Exam: ?Gen: middle aged woman in NAD ?HEENT: normocephalic atraumatic, left sided maxillary and submandibular swelling with no fluctuance or erythema, mild tenderness, poor dentition with dental caries noted, patent oropharynx ?CV: RRR, no m/r/g   ?Resp: CTAB, normal WOB  ?GI: soft, nontender ?MSK: moves all extremities without difficulty ?Skin:warm and dry, no lower extremity edema ?Neuro:alert answering questions appropriately ?Psych: normal affect ? ? ?Assessment & Plan:  ? ?See Encounters Tab for problem based charting. ? ?Patient discussed with Dr.  Cain Sieve   ? ?

## 2022-03-08 NOTE — Patient Instructions (Signed)
Kayla Adkins ? ?It was a pleasure seeing you in the clinic today.  ? ?We talked about your tooth swelling, your need for a pap smear, and your vertigo ? ?Vertigo- I refilled your meclizine ?Pap smear- I placed a referral to the OBGYn ?Tooth swelling- take the antibiotics I prescribed and call your dentist to get an appointment ASAP. ? ?If you have any fevers, chills, or difficulty swallowing I recommend that you seek urgent medical attention as your infection may have gotten wrose. ? ?Please call our clinic at [phone] if you have any questions or concerns. The best time to call is Monday-Friday from 9am-4pm, but there is someone available 24/7 at the same number. If you need medication refills, please notify your pharmacy one week in advance and they will send [place] a request. ?  ?Thank you for letting us take part in your care. We look forward to seeing you next time! ? ?

## 2022-03-08 NOTE — Assessment & Plan Note (Signed)
Replaced OBGYN referral ?

## 2022-03-08 NOTE — Assessment & Plan Note (Signed)
Patient has tooth pain and swelling. No fever or chills and is able to eat, drink, swallow, manage her secretions, and has no difficulty breathing. Has made a dentist appointment previously for a month or two away and plans to call and be seen sooner. ? ?A/P ?- 7 days of augmentin ?- no abscess or fluid collection noted on exam ?- needs to see the dentist for source control of current infection, recommended patient call the dentist to get in ASAP ?

## 2022-03-08 NOTE — Assessment & Plan Note (Signed)
Refilled meclizine.  

## 2022-03-20 NOTE — Progress Notes (Signed)
Internal Medicine Clinic Attending ° °Case discussed with Dr. DeMaio  At the time of the visit.  We reviewed the resident’s history and exam and pertinent patient test results.  I agree with the assessment, diagnosis, and plan of care documented in the resident’s note. ° ° °

## 2022-03-30 ENCOUNTER — Emergency Department (HOSPITAL_COMMUNITY)
Admission: EM | Admit: 2022-03-30 | Discharge: 2022-03-30 | Disposition: A | Discharge status: Home / Self Care | Payer: Medicaid Other | Attending: Emergency Medicine | Admitting: Emergency Medicine

## 2022-03-30 ENCOUNTER — Encounter (HOSPITAL_COMMUNITY): Payer: Self-pay | Admitting: Emergency Medicine

## 2022-03-30 DIAGNOSIS — Y92410 Unspecified street and highway as the place of occurrence of the external cause: Secondary | ICD-10-CM | POA: Insufficient documentation

## 2022-03-30 DIAGNOSIS — Z041 Encounter for examination and observation following transport accident: Secondary | ICD-10-CM | POA: Diagnosis not present

## 2022-03-30 DIAGNOSIS — M79629 Pain in unspecified upper arm: Secondary | ICD-10-CM | POA: Insufficient documentation

## 2022-03-30 MED ORDER — IBUPROFEN 600 MG PO TABS: 600.0000 mg | ORAL_TABLET | Freq: Four times a day (QID) | ORAL | 0 refills | Status: DC | PRN

## 2022-03-30 MED ORDER — ACETAMINOPHEN 500 MG PO TABS: 500.0000 mg | ORAL_TABLET | Freq: Four times a day (QID) | ORAL | 0 refills | Status: DC | PRN
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
## 2022-03-30 NOTE — ED Notes (Signed)
Patient given ice pack
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
## 2022-04-02 ENCOUNTER — Telehealth: Payer: Self-pay | Admitting: *Deleted

## 2022-04-02 NOTE — Telephone Encounter (Signed)
Transition Care Management Follow-up Telephone Call ?Date of discharge and from where: 03/30/2022 [facility] ?How have you been since you were released from the hospital? "Still sore" ?Any questions or concerns? No ? ?Items Reviewed: ?Did the pt receive and understand the discharge instructions provided? Yes  ?Medications obtained and verified? Yes  ?Other? No  ?Any new allergies since your discharge? No  ?Dietary orders reviewed? No ?Do you have support at home? No  ? ? ?Functional Questionnaire: (I = Independent and D = Dependent) ?ADLs: I ? ?Bathing/Dressing- I ? ?Meal Prep- I ? ?Eating- I ? ?Maintaining continence- I ? ?Transferring/Ambulation- I ? ?Managing Meds- I ? ?Follow up appointments reviewed: ? ?PCP Hospital f/u appt confirmed? No  ?Specialist Hospital f/u appt confirmed? No   ?Are transportation arrangements needed? No  ?If their condition worsens, is the pt aware to call PCP or go to the Emergency Dept.? Yes ?Was the patient provided with contact information for the PCP's office or ED? Yes ?Was to pt encouraged to call back with questions or concerns? Yes  ?

## 2022-04-05 ENCOUNTER — Telehealth: Payer: Self-pay

## 2022-04-05 NOTE — Telephone Encounter (Signed)
Thanks
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
## 2022-04-11 ENCOUNTER — Encounter: Payer: Medicaid Other | Admitting: Internal Medicine

## 2022-04-12 ENCOUNTER — Encounter: Payer: Self-pay | Admitting: Internal Medicine

## 2022-04-24 ENCOUNTER — Ambulatory Visit: Payer: Medicaid Other | Admitting: Internal Medicine

## 2022-04-24 ENCOUNTER — Encounter: Payer: Self-pay | Admitting: Internal Medicine

## 2022-04-24 ENCOUNTER — Other Ambulatory Visit: Payer: Self-pay

## 2022-04-24 VITALS — BP 113/75 | HR 82 | Temp 98.6°F | Resp 24 | Ht 66.0 in | Wt 191.5 lb

## 2022-04-24 DIAGNOSIS — M79605 Pain in left leg: Secondary | ICD-10-CM

## 2022-04-24 DIAGNOSIS — M79606 Pain in leg, unspecified: Secondary | ICD-10-CM | POA: Insufficient documentation

## 2022-04-24 DIAGNOSIS — M79604 Pain in right leg: Secondary | ICD-10-CM
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
## 2022-04-24 NOTE — Patient Instructions (Addendum)
I will call you with the results of your labs when they return and to discuss further plans.
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
## 2022-04-25 LAB — CMP14 + ANION GAP
ALT: 9 [IU]/L (ref 0–32)
AST: 11 [IU]/L (ref 0–40)
Albumin/Globulin Ratio: 1.6 (ref 1.2–2.2)
Albumin: 4.2 g/dL (ref 3.8–4.8)
Alkaline Phosphatase: 78 [IU]/L (ref 44–121)
Anion Gap: 16 mmol/L (ref 10.0–18.0)
BUN/Creatinine Ratio: 13 (ref 9–23)
BUN: 10 mg/dL (ref 6–24)
Bilirubin Total: <0.2 mg/dL (ref 0.0–1.2)
CO2: 21 mmol/L (ref 20–29)
Calcium: 9.2 mg/dL (ref 8.7–10.2)
Chloride: 102 mmol/L (ref 96–106)
Creatinine, Ser: 0.75 mg/dL (ref 0.57–1.00)
Globulin, Total: 2.6 g/dL (ref 1.5–4.5)
Glucose: 86 mg/dL (ref 70–99)
Potassium: 4 mmol/L (ref 3.5–5.2)
Sodium: 139 mmol/L (ref 134–144)
Total Protein: 6.8 g/dL (ref 6.0–8.5)
eGFR: 103 mL/min/{1.73_m2}

## 2022-04-25 LAB — URINALYSIS, ROUTINE W REFLEX MICROSCOPIC
Bilirubin, UA: NEGATIVE
Glucose, UA: NEGATIVE
Ketones, UA: NEGATIVE
Nitrite, UA: NEGATIVE
RBC, UA: NEGATIVE
Specific Gravity, UA: 1.026 (ref 1.005–1.030)
Urobilinogen, Ur: 1 mg/dL (ref 0.2–1.0)
pH, UA: 5.5 (ref 5.0–7.5)

## 2022-04-25 LAB — CBC WITH DIFFERENTIAL/PLATELET
Basophils Absolute: 0 10*3/uL (ref 0.0–0.2)
Basos: 0 %
EOS (ABSOLUTE): 0.1 10*3/uL (ref 0.0–0.4)
Eos: 3 %
Hematocrit: 31.9 % — ABNORMAL LOW (ref 34.0–46.6)
Hemoglobin: 10.2 g/dL — ABNORMAL LOW (ref 11.1–15.9)
Immature Grans (Abs): 0 10*3/uL (ref 0.0–0.1)
Immature Granulocytes: 0 %
Lymphocytes Absolute: 2.5 10*3/uL (ref 0.7–3.1)
Lymphs: 48 %
MCH: 27.9 pg (ref 26.6–33.0)
MCHC: 32 g/dL (ref 31.5–35.7)
MCV: 87 fL (ref 79–97)
Monocytes Absolute: 0.4 10*3/uL (ref 0.1–0.9)
Monocytes: 7 %
Neutrophils Absolute: 2.2 10*3/uL (ref 1.4–7.0)
Neutrophils: 42 %
Platelets: 282 10*3/uL (ref 150–450)
RBC: 3.65 x10E6/uL — ABNORMAL LOW (ref 3.77–5.28)
RDW: 15.4 % (ref 11.7–15.4)
WBC: 5.3 10*3/uL (ref 3.4–10.8)

## 2022-04-25 LAB — MICROSCOPIC EXAMINATION
Casts: NONE SEEN /lpf
RBC, Urine: NONE SEEN /hpf (ref 0–2)
WBC, UA: >30 /hpf — AB (ref 0–5)

## 2022-04-25 LAB — CK: Total CK: 121 U/L (ref 32–182)

## 2022-04-25 LAB — SEDIMENTATION RATE: Sed Rate: 32 mm/hr (ref 0–32)

## 2022-04-25 LAB — C-REACTIVE PROTEIN: CRP: 3 mg/L (ref 0–10)

## 2022-04-25 NOTE — Progress Notes (Signed)
Lab results and follow up recommendations reviewed with pt.

## 2022-05-03 ENCOUNTER — Ambulatory Visit: Payer: Medicaid Other | Admitting: Women's Health

## 2022-05-03 NOTE — Progress Notes (Signed)
Internal Medicine Clinic Attending  Case discussed with the resident at the time of the visit.  We reviewed the resident's history and exam and pertinent patient test results.  I agree with the assessment, diagnosis, and plan of care documented in the resident's note.  

## 2022-06-18 ENCOUNTER — Ambulatory Visit (INDEPENDENT_AMBULATORY_CARE_PROVIDER_SITE_OTHER): Payer: Medicaid Other | Admitting: *Deleted

## 2022-06-18 DIAGNOSIS — Z111 Encounter for screening for respiratory tuberculosis: Secondary | ICD-10-CM

## 2022-06-20 LAB — TB SKIN TEST: TB Skin Test: NEGATIVE

## 2022-07-19 ENCOUNTER — Emergency Department (HOSPITAL_COMMUNITY): Payer: Medicaid Other

## 2022-07-19 ENCOUNTER — Encounter (HOSPITAL_COMMUNITY): Payer: Self-pay

## 2022-07-19 ENCOUNTER — Emergency Department (HOSPITAL_COMMUNITY)
Admission: EM | Admit: 2022-07-19 | Discharge: 2022-07-19 | Disposition: A | Discharge status: Home / Self Care | Payer: Medicaid Other | Attending: Emergency Medicine | Admitting: Emergency Medicine

## 2022-07-19 DIAGNOSIS — S92515A Nondisplaced fracture of proximal phalanx of left lesser toe(s), initial encounter for closed fracture: Secondary | ICD-10-CM | POA: Insufficient documentation

## 2022-07-19 DIAGNOSIS — X58XXXA Exposure to other specified factors, initial encounter: Secondary | ICD-10-CM | POA: Diagnosis not present

## 2022-07-19 DIAGNOSIS — R2231 Localized swelling, mass and lump, right upper limb: Secondary | ICD-10-CM | POA: Insufficient documentation

## 2022-07-19 DIAGNOSIS — Z79899 Other long term (current) drug therapy: Secondary | ICD-10-CM | POA: Insufficient documentation

## 2022-07-19 DIAGNOSIS — S99922A Unspecified injury of left foot, initial encounter: Secondary | ICD-10-CM | POA: Diagnosis present

## 2022-07-19 MED ORDER — IBUPROFEN 200 MG PO TABS
600.0000 mg | ORAL_TABLET | Freq: Once | ORAL | Status: AC
  Administered 2022-07-19: 600 mg via ORAL
  Filled 2022-07-19: qty 3

## 2022-07-19 MED ORDER — DIPHENHYDRAMINE HCL 25 MG PO CAPS
25.0000 mg | ORAL_CAPSULE | Freq: Once | ORAL | Status: AC
  Administered 2022-07-19: 25 mg via ORAL
  Filled 2022-07-19: qty 1

## 2022-07-19 NOTE — ED Triage Notes (Signed)
Pt states she struck her left foot on her bed 2 days ago. C/o pain to lateral side of foot and pinky toe. Pt reports she was also stung by a bee yesterday on her right hand, and now has some swelling to the area.

## 2022-07-19 NOTE — Discharge Instructions (Addendum)
Note your work-up today was overall consistent with fracture of the base of your pinky toe.  We have treated this today with buddy taping of your fourth and fifth toes affected foot as well as put you in a postoperative shoe.  Wear the shoe until you are able to follow-up with orthopedics.  I have attached the information for you to call probably tomorrow morning to set up an appointment as soon as they have an availability.  This is important to make sure that you have proper healing of your toe.  Continue to rest, ice, elevate affected toe.  You can take ibuprofen as needed for pain/inflammation.  Avoid strenuous activities that may cause the fracture to worsen or not heal.  Please do not hesitate to return to the emergency department if the worrisome signs and symptoms we discussed become apparent.

## 2022-07-19 NOTE — ED Provider Notes (Signed)
[facility]-EMERGENCY DEPT Provider Note   CSN: [id number] Arrival date & time: 07/19/22  1641     History  Chief Complaint  Patient presents with   Foot Injury   Toe Injury    [facility] is a 41 y.o. female.   Foot Injury   41 year old female presents emergency department with complaints of left pinky toe pain.  She states that she was walking from the shower to her bedroom 2 days ago when she struck her left pinky toe on her bed frame.  She had to go to work so she did not get the toe evaluated then.  She presents emergency department with worsening pain as well as requesting x-ray to rule out fracture.  She also states that she was stung by a bee yesterday and has had some swelling to the dorsal aspect of her right hand.  Denies weakness or sensory deficits in affected toe.  Denies fever, known breaks in skin.  Denies feelings of throat closing and under, difficulty breathing, difficulty swallowing, history of anaphylaxis.  Past medical history rhabdomyolysis, hypertension  Home Medications Prior to Admission medications   Medication Sig Start Date End Date Taking? Authorizing Provider  acetaminophen (TYLENOL) 500 MG tablet Take 1 tablet (500 mg total) by mouth every 6 (six) hours as needed for mild pain. 03/30/22   Redwine, Madison A, PA-C  amLODipine (NORVASC) 5 MG tablet Take 1 tablet (5 mg total) by mouth daily. 08/31/21 08/31/22  Adron Bene, MD  ibuprofen (ADVIL) 600 MG tablet Take 1 tablet (600 mg total) by mouth every 6 (six) hours as needed. 03/30/22   Redwine, Madison A, PA-C  meclizine (ANTIVERT) 25 MG tablet Take 1 tablet (25 mg total) by mouth 3 (three) times daily as needed for dizziness. 03/08/22   Demaio, Alexa, MD  metoprolol tartrate (LOPRESSOR) 25 MG tablet TAKE 1 TABLET(25 MG) BY MOUTH TWICE DAILY 08/31/21   Adron Bene, MD      Allergies    Patient has no known allergies.    Review of Systems   Review of Systems  All other  systems reviewed and are negative.   Physical Exam Updated Vital Signs BP (!) 137/97   Pulse 76   Temp 99.2 F (37.3 C) (Oral)   Resp 16   Ht 5\' 6"  (1.676 m)   Wt 80.7 kg   SpO2 100%   BMI 28.73 kg/m  Physical Exam Vitals and nursing note reviewed.  Constitutional:      General: She is not in acute distress.    Appearance: She is well-developed.     Comments: No stridor noticed on exam.  Patient tolerating oral secretions without difficulty.  She is breathing unlabored.  HENT:     Head: Normocephalic and atraumatic.  Eyes:     Conjunctiva/sclera: Conjunctivae normal.  Cardiovascular:     Rate and Rhythm: Normal rate and regular rhythm.     Heart sounds: No murmur heard. Pulmonary:     Effort: Pulmonary effort is normal. No respiratory distress.     Breath sounds: Normal breath sounds.  Abdominal:     Palpations: Abdomen is soft.     Tenderness: There is no abdominal tenderness.  Musculoskeletal:        General: No swelling.     Cervical back: Neck supple. No rigidity or tenderness.     Right lower leg: No edema.     Left lower leg: No edema.     Comments: Patient has ecchymosis and  mild swelling noted on her left fifth toe.  No observable step-off or deformity noticed.  No obvious lesion or breaks in skin.  Patient has tenderness to palpation of base of pinky toe.  She is able to move all 5 digits of affected foot without difficulty.  She is complaining of mild sensory deficits in affected toe.  Dorsalis pedis pulses full and intact bilaterally.  Cap refill less than 2 seconds.  Patient has swelling noted on the dorsal aspect of her right hand.  Area where bee was reportedly stung shows no evidence of foreign body retention.  Swelling has decreased compared to yesterday per patient.  She is requesting Benadryl at this time.  Skin:    General: Skin is warm and dry.     Capillary Refill: Capillary refill takes less than 2 seconds.  Neurological:     Mental Status: She is  alert.  Psychiatric:        Mood and Affect: Mood normal.     ED Results / Procedures / Treatments   Labs (all labs ordered are listed, but only abnormal results are displayed) Labs Reviewed - No data to display  EKG None  Radiology DG Foot Complete Left  Result Date: 07/19/2022 CLINICAL DATA:  Trauma, pain EXAM: LEFT FOOT - COMPLETE 3+ VIEW COMPARISON:  None Available. FINDINGS: There is an essentially undisplaced fracture in the proximal shaft of proximal phalanx of fifth toe. There is no dislocation. Small plantar spur is seen in calcaneus. IMPRESSION: Essentially nondisplaced recent fracture is seen in the base of proximal phalanx of left fifth toe. Electronically Signed   By: Ernie Avena M.D.   On: 07/19/2022 17:21    Procedures Procedures    Medications Ordered in ED Medications  diphenhydrAMINE (BENADRYL) capsule 25 mg (has no administration in time range)  ibuprofen (ADVIL) tablet 600 mg (has no administration in time range)    ED Course/ Medical Decision Making/ A&P                           Medical Decision Making Amount and/or Complexity of Data Reviewed Radiology: ordered.  Risk OTC drugs.   This patient presents to the ED for concern of left toe pain, this involves an extensive number of treatment options, and is a complaint that carries with it a high risk of complications and morbidity.  The differential diagnosis includes fracture, strain/sprain, osteomyelitis, necrotizing fasciitis, compartment syndrome, osseous DO arthritis, rheumatoid arthritis, gout, septic arthritis, anaphylaxis, localized skin reaction, anaphylaxis   Co morbidities that complicate the patient evaluation  See above   Additional history obtained:  Additional history obtained from EMR   Lab Tests:  N/a   Imaging Studies ordered:  I ordered imaging studies including left foot x-ray I independently visualized and interpreted imaging which showed nondisplaced  proximal phalanx of base of left fifth digit I agree with the radiologist interpretation   Cardiac Monitoring: / EKG:  The patient was maintained on a cardiac monitor.  I personally viewed and interpreted the cardiac monitored which showed an underlying rhythm of: Sinus rhythm   Consultations Obtained:  N/a   Problem List / ED Course / Critical interventions / Medication management  Left hip fracture I ordered medication including ibuprofen for pain.  Benadryl for localized skin reaction secondary to bee sting   Reevaluation of the patient after these medicines showed that the patient improved I have reviewed the patients home medicines and have made adjustments  as needed   Social Determinants of Health:  Current cigarette use.  Denies illicit drug use.   Test / Admission - Considered:  Left toe fracture, local skin reaction Vitals signs significant for mild hypertension with a blood pressure 137/97.  Recommend close follow with PCP regarding mild elevation of blood pressure.. Otherwise within normal range and stable throughout visit. Imaging studies significant for: See above Patient symptoms of pain secondary to fracture of toe.  Patient was treated with ibuprofen while in emergency department.  She was placed in a postop shoe with buddy tape of her fifth and fourth digits of affected foot.  Symptomatic therapy rest recommended with rest, ice, elevation, NSAID therapy.  Close follow-up with orthopedics recommended in 7 to 10 days for reevaluation of the toe.  Patient was treated with Benadryl for localized skin reaction secondary to hymenoptera toxin yesterday.  Doubt anaphylaxis given lack of progression of symptoms. Worrisome signs and symptoms were discussed with the patient, and the patient acknowledged understanding to return to the ED if noticed. Patient was stable upon discharge.         Final Clinical Impression(s) / ED Diagnoses Final diagnoses:  Closed  nondisplaced fracture of proximal phalanx of lesser toe of left foot, initial encounter    Rx / DC Orders ED Discharge Orders     None         Peter Garter, PA 07/19/22 1736    Phoebe Sharps, DO 07/20/22 0017

## 2022-09-05 ENCOUNTER — Encounter: Payer: Self-pay | Admitting: Internal Medicine

## 2022-09-05 ENCOUNTER — Encounter: Payer: Medicaid Other | Admitting: Family Medicine

## 2022-09-26 ENCOUNTER — Other Ambulatory Visit: Payer: Self-pay | Admitting: Internal Medicine

## 2022-10-04 ENCOUNTER — Encounter: Payer: Self-pay | Admitting: Internal Medicine

## 2022-10-04 ENCOUNTER — Encounter: Payer: Medicaid Other | Admitting: Student

## 2022-11-12 ENCOUNTER — Encounter: Payer: Medicaid Other | Admitting: Internal Medicine

## 2022-11-12 NOTE — Progress Notes (Deleted)
HTN: BP at goal, *. Patient reports good adherence to her medication regimen of amlodipine 5 mg daily and metoprolol 25 mg daily. Plan:  NEUROP:

## 2022-11-18 ENCOUNTER — Other Ambulatory Visit: Payer: Self-pay

## 2022-11-18 ENCOUNTER — Encounter (HOSPITAL_COMMUNITY): Payer: Self-pay | Admitting: Emergency Medicine

## 2022-11-18 ENCOUNTER — Emergency Department (HOSPITAL_COMMUNITY)
Admission: EM | Admit: 2022-11-18 | Discharge: 2022-11-18 | Disposition: A | Discharge status: Home / Self Care | Payer: Medicaid Other | Attending: Emergency Medicine | Admitting: Emergency Medicine

## 2022-11-18 DIAGNOSIS — Z20822 Contact with and (suspected) exposure to covid-19: Secondary | ICD-10-CM | POA: Insufficient documentation

## 2022-11-18 DIAGNOSIS — J101 Influenza due to other identified influenza virus with other respiratory manifestations: Secondary | ICD-10-CM | POA: Insufficient documentation

## 2022-11-18 DIAGNOSIS — E876 Hypokalemia: Secondary | ICD-10-CM | POA: Insufficient documentation

## 2022-11-18 DIAGNOSIS — R059 Cough, unspecified: Secondary | ICD-10-CM | POA: Diagnosis present

## 2022-11-18 DIAGNOSIS — J111 Influenza due to unidentified influenza virus with other respiratory manifestations: Secondary | ICD-10-CM

## 2022-11-18 LAB — CBC WITH DIFFERENTIAL/PLATELET
Abs Immature Granulocytes: 0.01 10*3/uL (ref 0.00–0.07)
Basophils Absolute: 0 10*3/uL (ref 0.0–0.1)
Basophils Relative: 0 %
Eosinophils Absolute: 0 10*3/uL (ref 0.0–0.5)
Eosinophils Relative: 0 %
HCT: 38.6 % (ref 36.0–46.0)
Hemoglobin: 12.1 g/dL (ref 12.0–15.0)
Immature Granulocytes: 0 %
Lymphocytes Relative: 41 %
Lymphs Abs: 1.7 10*3/uL (ref 0.7–4.0)
MCH: 27.4 pg (ref 26.0–34.0)
MCHC: 31.3 g/dL (ref 30.0–36.0)
MCV: 87.5 fL (ref 80.0–100.0)
Monocytes Absolute: 0.4 10*3/uL (ref 0.1–1.0)
Monocytes Relative: 9 %
Neutro Abs: 2 10*3/uL (ref 1.7–7.7)
Neutrophils Relative %: 50 %
Platelets: 283 10*3/uL (ref 150–400)
RBC: 4.41 MIL/uL (ref 3.87–5.11)
RDW: 15.5 % (ref 11.5–15.5)
WBC: 4 10*3/uL (ref 4.0–10.5)
nRBC: 0 % (ref 0.0–0.2)

## 2022-11-18 LAB — RESP PANEL BY RT-PCR (RSV, FLU A&B, COVID)  RVPGX2
Influenza A by PCR: POSITIVE — AB
Influenza B by PCR: NEGATIVE
Resp Syncytial Virus by PCR: NEGATIVE
SARS Coronavirus 2 by RT PCR: NEGATIVE

## 2022-11-18 LAB — BASIC METABOLIC PANEL
Anion gap: 11 (ref 5–15)
BUN: 6 mg/dL (ref 6–20)
CO2: 27 mmol/L (ref 22–32)
Calcium: 9.2 mg/dL (ref 8.9–10.3)
Chloride: 99 mmol/L (ref 98–111)
Creatinine, Ser: 1.06 mg/dL — ABNORMAL HIGH (ref 0.44–1.00)
GFR, Estimated: >60 mL/min (ref >60)
Glucose, Bld: 115 mg/dL — ABNORMAL HIGH (ref 70–99)
Potassium: 2.7 mmol/L — CL (ref 3.5–5.1)
Sodium: 137 mmol/L (ref 135–145)

## 2022-11-18 LAB — MAGNESIUM: Magnesium: 1.8 mg/dL (ref 1.7–2.4)

## 2022-11-18 MED ORDER — IBUPROFEN 400 MG PO TABS
600.0000 mg | ORAL_TABLET | Freq: Once | ORAL | Status: AC
  Administered 2022-11-18: 600 mg via ORAL
  Filled 2022-11-18: qty 1

## 2022-11-18 MED ORDER — KETOROLAC TROMETHAMINE 30 MG/ML IJ SOLN
30.0000 mg | Freq: Once | INTRAMUSCULAR | Status: AC
  Administered 2022-11-18: 30 mg via INTRAVENOUS
  Filled 2022-11-18: qty 1

## 2022-11-18 MED ORDER — POTASSIUM CHLORIDE ER 10 MEQ PO TBCR: 20.0000 meq | EXTENDED_RELEASE_TABLET | Freq: Every day | ORAL | 0 refills | Status: DC

## 2022-11-18 MED ORDER — POTASSIUM CHLORIDE CRYS ER 20 MEQ PO TBCR
40.0000 meq | EXTENDED_RELEASE_TABLET | Freq: Once | ORAL | Status: AC
  Administered 2022-11-18: 40 meq via ORAL
  Filled 2022-11-18: qty 2

## 2022-11-18 MED ORDER — ONDANSETRON 4 MG PO TBDP
8.0000 mg | ORAL_TABLET | Freq: Once | ORAL | Status: AC
  Administered 2022-11-18: 8 mg via ORAL
  Filled 2022-11-18: qty 2

## 2022-11-18 MED ORDER — SODIUM CHLORIDE 0.9 % IV BOLUS
1000.0000 mL | Freq: Once | INTRAVENOUS | Status: AC
  Administered 2022-11-18: 1000 mL via INTRAVENOUS

## 2022-11-18 MED ORDER — POTASSIUM CHLORIDE 10 MEQ/100ML IV SOLN
10.0000 meq | Freq: Once | INTRAVENOUS | Status: AC
  Administered 2022-11-18: 10 meq via INTRAVENOUS
  Filled 2022-11-18: qty 100
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
## 2022-11-18 NOTE — ED Triage Notes (Signed)
Patient here from home c/o cold like symptoms starting on Tuesday. States her granddaughter came home sick and since she's been having n/v/d, poor PO intake. Endorses fever,chill, ear ache, body aches, coughing, congestion.

## 2022-11-18 NOTE — ED Provider Notes (Signed)
[facility] EMERGENCY DEPARTMENT Provider Note   CSN: [id number] Arrival date & time: 11/18/22  1057     History  No chief complaint on file.   Kayla Adkins is a 41 y.o. female presenting to the ED with 6 days of nausea, chills, cough, congestion, headache, diarrhea, poor appetite.  She reports that her granddaughter was sick with similar symptoms the house as well as her husband.  She reports she has had difficulty keeping anything down due to a loss of appetite, only ate a few grapes yesterday.  She is able to drink water.  She has been having nonbloody diarrhea.  HPI     Home Medications Prior to Admission medications   Medication Sig Start Date End Date Taking? Authorizing Provider  potassium chloride (KLOR-CON) 10 MEQ tablet Take 2 tablets (20 mEq total) by mouth daily for 10 days. 11/19/22 11/29/22 Yes Ulah Olmo, Kermit Balo, MD  acetaminophen (TYLENOL) 500 MG tablet Take 1 tablet (500 mg total) by mouth every 6 (six) hours as needed for mild pain. 03/30/22   Redwine, Madison A, PA-C  amLODipine (NORVASC) 5 MG tablet TAKE 1 TABLET(5 MG) BY MOUTH DAILY 09/28/22   Champ Mungo, DO  ibuprofen (ADVIL) 600 MG tablet Take 1 tablet (600 mg total) by mouth every 6 (six) hours as needed. 03/30/22   Redwine, Madison A, PA-C  meclizine (ANTIVERT) 25 MG tablet Take 1 tablet (25 mg total) by mouth 3 (three) times daily as needed for dizziness. 03/08/22   Demaio, Alexa, MD  metoprolol tartrate (LOPRESSOR) 25 MG tablet TAKE 1 TABLET(25 MG) BY MOUTH TWICE DAILY 09/28/22   Champ Mungo, DO      Allergies    Patient has no known allergies.    Review of Systems   Review of Systems  Physical Exam Updated Vital Signs BP 124/87   Pulse (!) 108   Temp 98.5 F (36.9 C)   Resp 19   SpO2 100%  Physical Exam Constitutional:      General: She is not in acute distress. HENT:     Head: Normocephalic and atraumatic.  Eyes:     Conjunctiva/sclera: Conjunctivae normal.     Pupils:  Pupils are equal, round, and reactive to light.  Cardiovascular:     Rate and Rhythm: Normal rate and regular rhythm.  Pulmonary:     Effort: Pulmonary effort is normal. No respiratory distress.  Abdominal:     General: There is no distension.     Tenderness: There is no abdominal tenderness.  Skin:    General: Skin is warm and dry.  Neurological:     General: No focal deficit present.     Mental Status: She is alert. Mental status is at baseline.  Psychiatric:        Mood and Affect: Mood normal.        Behavior: Behavior normal.     ED Results / Procedures / Treatments   Labs (all labs ordered are listed, but only abnormal results are displayed) Labs Reviewed  RESP PANEL BY RT-PCR (RSV, FLU A&B, COVID)  RVPGX2 - Abnormal; Notable for the following components:      Result Value   Influenza A by PCR POSITIVE (*)    All other components within normal limits  BASIC METABOLIC PANEL - Abnormal; Notable for the following components:   Potassium 2.7 (*)    Glucose, Bld 115 (*)    Creatinine, Ser 1.06 (*)    All other components within normal limits  CBC WITH DIFFERENTIAL/PLATELET  MAGNESIUM    EKG None  Radiology No results found.  Procedures Procedures    Medications Ordered in ED Medications  ondansetron (ZOFRAN-ODT) disintegrating tablet 8 mg (8 mg Oral Given 11/18/22 1304)  ibuprofen (ADVIL) tablet 600 mg (600 mg Oral Given 11/18/22 1304)  potassium chloride SA (KLOR-CON M) CR tablet 40 mEq (40 mEq Oral Given 11/18/22 1304)  potassium chloride 10 mEq in 100 mL IVPB (10 mEq Intravenous New Bag/Given 11/18/22 1523)  ketorolac (TORADOL) 30 MG/ML injection 30 mg (30 mg Intravenous Given 11/18/22 1522)  sodium chloride 0.9 % bolus 1,000 mL (1,000 mLs Intravenous New Bag/Given 11/18/22 1522)    ED Course/ Medical Decision Making/ A&P                           Medical Decision Making Risk Prescription drug management.   Patient is here with constellation of  symptoms that are strongly suggestive of viral syndrome.  She tested positive for influenza.  I suspect this is the acute cause of her symptoms.  I have a lower suspicion for bacterial infection and do not believe there is an indication for antibiotics.  Her lab work was personally reviewed and interpreted by myself, notable only for hypokalemia with potassium 2.7, likely related to poor oral intake and diarrhea.  I have given her oral and IV supplementation, will prescribe potassium for home.  She has a PCP appointment in 2 or 3 days and can have her potassium rechecked.  Otherwise it will be conservative management for the next 3 to 4 days, and I suspect she will be feeling better as the virus and its course.  There is no indication for Tamiflu.  There is no indication for hospitalization at this time.  We discussed drinking Gatorade or something that has more potassium in it.  Staying hydrated.  A work note was provided.  She verbalized understanding.        Final Clinical Impression(s) / ED Diagnoses Final diagnoses:  Influenza  Hypokalemia    Rx / DC Orders ED Discharge Orders          Ordered    potassium chloride (KLOR-CON) 10 MEQ tablet  Daily        11/18/22 1407              Terald Sleeper, MD 11/18/22 1636

## 2022-11-18 NOTE — Discharge Instructions (Addendum)
You tested positive for the flu.  Your potassium level was low.  We gave you potassium in the ER and I prescribed 10 more days of pills.  Please call your doctors office to ask for a recheck of your potassium blood work this week.  Keep hydrated at home, I recommend drinking water and/or Gatorade, which has potassium in it.  You likely will feel better over the next 2 to 3 days as the virus finishes his course.  You can use over-the-counter cold and flu type medications to help with your symptoms and body aches.

## 2022-11-19 ENCOUNTER — Telehealth: Payer: Self-pay | Admitting: *Deleted

## 2022-11-19 NOTE — Telephone Encounter (Signed)
Patient called in stating she was seen in ED yesterday and dx with Flu. Also, K+ was noted to be 2.7. She took the first dose of K+ 20 mcg this AM. Has appt for tomorrow but wonders if she should take more doses before having repeat BMP. Please advise if patient should r/s appt for next week.

## 2022-11-19 NOTE — Telephone Encounter (Signed)
Per today's Attending, patient should keep her appt tomorrow. Left detailed message on patient's self-identified VM with this information.

## 2022-11-20 ENCOUNTER — Ambulatory Visit: Payer: Medicaid Other | Admitting: Student

## 2022-11-20 ENCOUNTER — Encounter: Payer: Self-pay | Admitting: Student

## 2022-11-20 VITALS — BP 142/93 | HR 86 | Temp 98.4°F | Ht 66.0 in | Wt 194.4 lb

## 2022-11-20 DIAGNOSIS — J101 Influenza due to other identified influenza virus with other respiratory manifestations: Secondary | ICD-10-CM | POA: Diagnosis not present

## 2022-11-20 DIAGNOSIS — Z87891 Personal history of nicotine dependence: Secondary | ICD-10-CM | POA: Diagnosis not present

## 2022-11-20 DIAGNOSIS — I10 Essential (primary) hypertension: Secondary | ICD-10-CM | POA: Diagnosis not present

## 2022-11-20 DIAGNOSIS — M609 Myositis, unspecified: Secondary | ICD-10-CM | POA: Diagnosis not present

## 2022-11-20 DIAGNOSIS — E876 Hypokalemia: Secondary | ICD-10-CM

## 2022-11-20 MED ORDER — AMLODIPINE-OLMESARTAN 5-20 MG PO TABS
1.0000 | ORAL_TABLET | Freq: Every day | ORAL | 0 refills | Status: DC
Start: 2022-11-20 — End: 2023-05-16

## 2022-11-20 MED ORDER — DEXTROMETHORPHAN POLISTIREX ER 30 MG/5ML PO SUER
15.0000 mg | ORAL | 0 refills | Status: DC | PRN
Start: 2022-11-20 — End: 2022-11-30

## 2022-11-20 NOTE — Patient Instructions (Addendum)
Kayla Adkins, Kayla Adkins you for allowing me to take part in your care today.  Here are your instructions.  1.  Regarding your potassium, I am checking your potassium today.  I will mostly checking your kidney function.  Please await phone call with lab results.  2.  Regarding your flu, and writing you Robitussin today for your cough. Please stay hydrated and take lots of rest.  This should resolve on its own.   3.  Regarding your blood pressure, I am taking you off of metoprolol and amlodipine.  I am instead starting you on a combination pill called amlodipine-olmesartan.  Please take this daily.  Please await my phone call for your kidney function before starting it.  Please keep a log of your blood pressure and bring it in at next visit in 1 month.  4.  Regarding next visit , please follow-up in 1 month for blood pressure follow up and bring your log with you.   Thank you, Dr. Allena Katz  If you have any other questions please contact the internal medicine clinic at [phone]

## 2022-11-20 NOTE — Assessment & Plan Note (Signed)
Patient has originally been on metoprolol tartrate 25 mg twice daily and amlodipine 5 mg daily.  Patient blood pressure still elevated today into the 140s.  Patient does report that she only takes her metoprolol 1 time a day.  Patient also reports that she is compliant with her amlodipine.  Patient does not check her blood pressure at home.  Patient instructed to do so.  She denies any chest pain or shortness of breath.  Patient states otherwise she is doing fine.  Given she is not as compliant with her metoprolol, I am inclined to start a medication that requires less dosing. -BMP pending -Start amlodipine-olmesartan 5-20 mg tablet daily -Can discontinue amlodipine and metoprolol tartrate  Addendum: -BMP showing baseline kidney function.  Potassium slightly decreased.  Patient safe to start her amlodipine-olmesartan tablet -Lipid panel within normal limits

## 2022-11-20 NOTE — Assessment & Plan Note (Signed)
Patient recently patient recently positive for influenza A on 11/18/2022.  Patient reports having cough and sore throat.  She reports her myalgias have resolved.  She denies any diarrhea, nausea, or vomiting.  Patient states that she would like something for cough.  Patient instructed to stay hydrated and take lots of rest. -Prescribed Robitussin -Supportive care

## 2022-11-20 NOTE — Assessment & Plan Note (Signed)
Patient has history of myositis.  Patient requesting CK checks today.  No concerns for muscle pains today. -CK pending  Addendum: -CK came back elevated greater than 69,000.  This is consistent with rhabdomyolysis.  Patient will need to go to nearest emergency department to have further evaluation and management.

## 2022-11-20 NOTE — Progress Notes (Signed)
CC: Cough  HPI:  [name] is a 41 y.o. female with a past medical history of vertigo, rhabdo, hypertension who presents with concerns of cough.  Please see assessment and plan for full HPI.  Past Medical History:  Diagnosis Date   Hypertension    Rhabdomyolysis 07/16/2018   Vertigo      Current Outpatient Medications:    amLODipine-olmesartan (AZOR) 5-20 MG tablet, Take 1 tablet by mouth daily., Disp: 90 tablet, Rfl: 0   dextromethorphan (ROBITUSSIN 12 HOUR COUGH) 30 MG/5ML liquid, Take 2.5 mLs (15 mg total) by mouth as needed for up to 10 days for cough., Disp: 89 mL, Rfl: 0   acetaminophen (TYLENOL) 500 MG tablet, Take 1 tablet (500 mg total) by mouth every 6 (six) hours as needed for mild pain., Disp: 30 tablet, Rfl: 0   ibuprofen (ADVIL) 600 MG tablet, Take 1 tablet (600 mg total) by mouth every 6 (six) hours as needed., Disp: 20 tablet, Rfl: 0   meclizine (ANTIVERT) 25 MG tablet, Take 1 tablet (25 mg total) by mouth 3 (three) times daily as needed for dizziness., Disp: 30 tablet, Rfl: 0   potassium chloride (KLOR-CON) 10 MEQ tablet, Take 2 tablets (20 mEq total) by mouth daily for 10 days., Disp: 20 tablet, Rfl: 0  Review of Systems:   Cardiovascular: Patient endorses cough MSK: Patient denies any muscle pains Physical Exam:  Vitals:   11/20/22 1530 11/20/22 1540  BP: (!) 142/95 (!) 142/93  Pulse: 91 86  Temp: 98.4 F (36.9 C)   TempSrc: Oral   SpO2: 100%   Weight: 194 lb 6.4 oz (88.2 kg)   Height: 5\' 6"  (1.676 m)     General: Patient is sitting comfortably in the room  Head: Normocephalic, atraumatic  Cardio: Regular rate and rhythm, no murmurs, rubs or gallops. 2+ pulses to bilateral upper and lower extremities  Pulmonary: Clear to ausculation bilaterally with no rales, rhonchi, and crackles    Assessment & Plan:   Hypertension Patient has originally been on metoprolol tartrate 25 mg twice daily and amlodipine 5 mg daily.  Patient blood pressure  still elevated today into the 140s.  Patient does report that she only takes her metoprolol 1 time a day.  Patient also reports that she is compliant with her amlodipine.  Patient does not check her blood pressure at home.  Patient instructed to do so.  She denies any chest pain or shortness of breath.  Patient states otherwise she is doing fine.  Given she is not as compliant with her metoprolol, I am inclined to start a medication that requires less dosing. -BMP pending -Start amlodipine-olmesartan 5-20 mg tablet daily -Can discontinue amlodipine and metoprolol tartrate  Myositis Patient has history of myositis.  Patient requesting CK checks today.  No concerns for muscle pains today. -CK pending  Hypokalemia Patient recently had diarrhea, and likely potassium on 11/18/2022 was at 2.7.  Patient discharged from emergency room with potassium supplements.  Patient's diarrhea has since resolved.  She denies any nausea or vomiting.  Will recheck potassium today. -BMP pending -Lipid panel pending  Influenza A Patient recently patient recently positive for influenza A on 11/18/2022.  Patient reports having cough and sore throat.  She reports her myalgias have resolved.  She denies any diarrhea, nausea, or vomiting.  Patient states that she would like something for cough.  Patient instructed to stay hydrated and take lots of rest. -Prescribed Robitussin -Supportive care  Patient seen with Dr. 11/20/2022  Modena Slater, DO PGY-1 Internal Medicine Resident  Pager: [phone]

## 2022-11-20 NOTE — Assessment & Plan Note (Addendum)
Patient recently had diarrhea, and likely potassium on 11/18/2022 was at 2.7.  Patient discharged from emergency room with potassium supplements.  Patient's diarrhea has since resolved.  She denies any nausea or vomiting.  Will recheck potassium today. -BMP pending -Lipid panel pending   Addendum: -BMP showed baseline kidney function.  Slightly decreased potassium at 3.3.  Lipid panel within normal limits.

## 2022-11-21 ENCOUNTER — Encounter (HOSPITAL_COMMUNITY): Payer: Self-pay | Admitting: Internal Medicine

## 2022-11-21 ENCOUNTER — Other Ambulatory Visit: Payer: Self-pay

## 2022-11-21 ENCOUNTER — Telehealth: Payer: Self-pay | Admitting: *Deleted

## 2022-11-21 ENCOUNTER — Inpatient Hospital Stay (HOSPITAL_COMMUNITY)
Admission: EM | Admit: 2022-11-21 | Discharge: 2022-11-30 | DRG: 558 | Disposition: A | Discharge status: Home / Self Care | Payer: Medicaid Other | Attending: Internal Medicine | Admitting: Internal Medicine

## 2022-11-21 DIAGNOSIS — R7989 Other specified abnormal findings of blood chemistry: Secondary | ICD-10-CM | POA: Diagnosis present

## 2022-11-21 DIAGNOSIS — M6282 Rhabdomyolysis: Principal | ICD-10-CM | POA: Diagnosis present

## 2022-11-21 DIAGNOSIS — E876 Hypokalemia: Secondary | ICD-10-CM | POA: Diagnosis present

## 2022-11-21 DIAGNOSIS — N920 Excessive and frequent menstruation with regular cycle: Secondary | ICD-10-CM | POA: Diagnosis present

## 2022-11-21 DIAGNOSIS — J101 Influenza due to other identified influenza virus with other respiratory manifestations: Secondary | ICD-10-CM | POA: Diagnosis present

## 2022-11-21 DIAGNOSIS — B9789 Other viral agents as the cause of diseases classified elsewhere: Secondary | ICD-10-CM | POA: Diagnosis present

## 2022-11-21 DIAGNOSIS — M6008 Infective myositis, other site: Secondary | ICD-10-CM | POA: Diagnosis present

## 2022-11-21 DIAGNOSIS — M609 Myositis, unspecified: Secondary | ICD-10-CM | POA: Diagnosis present

## 2022-11-21 DIAGNOSIS — R748 Abnormal levels of other serum enzymes: Secondary | ICD-10-CM | POA: Diagnosis present

## 2022-11-21 DIAGNOSIS — I10 Essential (primary) hypertension: Secondary | ICD-10-CM | POA: Diagnosis present

## 2022-11-21 LAB — URINALYSIS, ROUTINE W REFLEX MICROSCOPIC
Bilirubin Urine: NEGATIVE
Glucose, UA: NEGATIVE mg/dL
Ketones, ur: NEGATIVE mg/dL
Leukocytes,Ua: NEGATIVE
Nitrite: NEGATIVE
Protein, ur: 100 mg/dL — AB
Specific Gravity, Urine: 1.018 (ref 1.005–1.030)
pH: 6 (ref 5.0–8.0)

## 2022-11-21 LAB — CBC WITH DIFFERENTIAL/PLATELET
Abs Immature Granulocytes: 0 10*3/uL (ref 0.00–0.07)
Basophils Absolute: 0 10*3/uL (ref 0.0–0.1)
Basophils Relative: 1 %
Eosinophils Absolute: 0 10*3/uL (ref 0.0–0.5)
Eosinophils Relative: 0 %
HCT: 35.7 % — ABNORMAL LOW (ref 36.0–46.0)
Hemoglobin: 11.1 g/dL — ABNORMAL LOW (ref 12.0–15.0)
Lymphocytes Relative: 59 %
Lymphs Abs: 2.8 10*3/uL (ref 0.7–4.0)
MCH: 27.3 pg (ref 26.0–34.0)
MCHC: 31.1 g/dL (ref 30.0–36.0)
MCV: 87.9 fL (ref 80.0–100.0)
Monocytes Absolute: 0.5 10*3/uL (ref 0.1–1.0)
Monocytes Relative: 10 %
Neutro Abs: 1.4 10*3/uL — ABNORMAL LOW (ref 1.7–7.7)
Neutrophils Relative %: 30 %
Platelets: 239 10*3/uL (ref 150–400)
RBC: 4.06 MIL/uL (ref 3.87–5.11)
RDW: 15.5 % (ref 11.5–15.5)
WBC: 4.8 10*3/uL (ref 4.0–10.5)
nRBC: 0 % (ref 0.0–0.2)
nRBC: 0 /100 WBC

## 2022-11-21 LAB — BMP8+ANION GAP
Anion Gap: 15 mmol/L (ref 10.0–18.0)
BUN/Creatinine Ratio: 11 (ref 9–23)
BUN: 7 mg/dL (ref 6–24)
CO2: 25 mmol/L (ref 20–29)
Calcium: 9.5 mg/dL (ref 8.7–10.2)
Chloride: 101 mmol/L (ref 96–106)
Creatinine, Ser: 0.66 mg/dL (ref 0.57–1.00)
Glucose: 111 mg/dL — ABNORMAL HIGH (ref 70–99)
Potassium: 3.3 mmol/L — ABNORMAL LOW (ref 3.5–5.2)
Sodium: 141 mmol/L (ref 134–144)
eGFR: 112 mL/min/{1.73_m2} (ref >59)

## 2022-11-21 LAB — RENAL FUNCTION PANEL
Albumin: 3.8 g/dL (ref 3.5–5.0)
Anion gap: 7 (ref 5–15)
BUN: 6 mg/dL (ref 6–20)
CO2: 26 mmol/L (ref 22–32)
Calcium: 9.1 mg/dL (ref 8.9–10.3)
Chloride: 105 mmol/L (ref 98–111)
Creatinine, Ser: 0.7 mg/dL (ref 0.44–1.00)
GFR, Estimated: >60 mL/min (ref >60)
Glucose, Bld: 89 mg/dL (ref 70–99)
Phosphorus: 2.3 mg/dL — ABNORMAL LOW (ref 2.5–4.6)
Potassium: 3.2 mmol/L — ABNORMAL LOW (ref 3.5–5.1)
Sodium: 138 mmol/L (ref 135–145)

## 2022-11-21 LAB — I-STAT BETA HCG BLOOD, ED (MC, WL, AP ONLY): I-stat hCG, quantitative: <5 m[IU]/mL (ref <5)

## 2022-11-21 LAB — LIPID PANEL
Chol/HDL Ratio: 3.5 ratio (ref 0.0–4.4)
Cholesterol, Total: 122 mg/dL (ref 100–199)
HDL: 35 mg/dL — ABNORMAL LOW (ref >39)
LDL Chol Calc (NIH): 62 mg/dL (ref 0–99)
Triglycerides: 145 mg/dL (ref 0–149)
VLDL Cholesterol Cal: 25 mg/dL (ref 5–40)

## 2022-11-21 LAB — SEDIMENTATION RATE: Sed Rate: 51 mm/hr — ABNORMAL HIGH (ref 0–22)

## 2022-11-21 LAB — C-REACTIVE PROTEIN: CRP: 1 mg/dL — ABNORMAL HIGH (ref <1.0)

## 2022-11-21 LAB — CK: Total CK: 69714 U/L (ref 32–182)

## 2022-11-21 MED ORDER — AMLODIPINE BESYLATE 5 MG PO TABS
5.0000 mg | ORAL_TABLET | Freq: Every day | ORAL | Status: DC
  Administered 2022-11-21 – 2022-11-30 (×10): 5 mg via ORAL
  Filled 2022-11-21 (×10): qty 1

## 2022-11-21 MED ORDER — SODIUM CHLORIDE 0.9 % IV BOLUS
1000.0000 mL | Freq: Once | INTRAVENOUS | Status: AC
  Administered 2022-11-21: 1000 mL via INTRAVENOUS

## 2022-11-21 MED ORDER — ENOXAPARIN SODIUM 40 MG/0.4ML IJ SOSY
40.0000 mg | PREFILLED_SYRINGE | INTRAMUSCULAR | Status: DC
  Filled 2022-11-21: qty 0.4

## 2022-11-21 MED ORDER — LACTATED RINGERS IV SOLN: INTRAVENOUS | Status: AC

## 2022-11-21 MED ORDER — SODIUM CHLORIDE 0.9 % IV BOLUS: 250.0000 mL | Freq: Once | INTRAVENOUS | Status: DC

## 2022-11-21 MED ORDER — SODIUM CHLORIDE 0.9 % IV SOLN
1000.0000 mg | Freq: Every day | INTRAVENOUS | Status: DC
  Administered 2022-11-22 (×2): 1000 mg via INTRAVENOUS
  Filled 2022-11-21 (×3): qty 16

## 2022-11-21 MED ORDER — POTASSIUM CHLORIDE CRYS ER 20 MEQ PO TBCR: 40.0000 meq | EXTENDED_RELEASE_TABLET | Freq: Two times a day (BID) | ORAL | Status: DC

## 2022-11-21 MED ORDER — POTASSIUM PHOSPHATES 15 MMOLE/5ML IV SOLN
15.0000 mmol | Freq: Once | INTRAVENOUS | Status: AC
  Administered 2022-11-22: 15 mmol via INTRAVENOUS
  Filled 2022-11-21 (×2): qty 5

## 2022-11-21 MED ORDER — POTASSIUM CHLORIDE 20 MEQ PO PACK
40.0000 meq | PACK | Freq: Once | ORAL | Status: AC
  Administered 2022-11-21: 40 meq via ORAL
  Filled 2022-11-21: qty 2

## 2022-11-21 MED ORDER — MELATONIN 3 MG PO TABS
3.0000 mg | ORAL_TABLET | Freq: Every day | ORAL | Status: DC
  Administered 2022-11-21 – 2022-11-29 (×9): 3 mg via ORAL
  Filled 2022-11-21 (×9): qty 1

## 2022-11-21 MED ORDER — PREDNISONE 20 MG PO TABS: 60.0000 mg | ORAL_TABLET | Freq: Every day | ORAL | Status: DC

## 2022-11-21 NOTE — ED Provider Triage Note (Signed)
Emergency Medicine Provider Triage Evaluation Note  Teodora Baumgarten , a 41 y.o. female  was evaluated in triage.  Pt complains of myalgias x 2 days to the lower extremity.  History of the same, history of myositis.  Not on steroids.  Was seen by primary few days ago for hospitalization follow-up, CK was checked and was elevated at 64,000+ told to go to the ED for additional evaluation.  Drinking fluids, no nausea, vomiting, diarrhea, chest pain or shortness of breath..  Review of Systems  Per HPI  Physical Exam  BP (!) 151/102 (BP Location: Right Arm)   Pulse 88   Temp 98.4 F (36.9 C) (Oral)   Resp 16   SpO2 99%  Gen:   Awake, no distress   Resp:  Normal effort  MSK:   Moves extremities without difficulty  Other:    Medical Decision Making  Medically screening exam initiated at 1:09 PM.  Appropriate orders placed.  Gracelyn Coventry was informed that the remainder of the evaluation will be completed by another provider, this initial triage assessment does not replace that evaluation, and the importance of remaining in the ED until their evaluation is complete.  CK [id number] yesterday.  Will check labs, electrolytes, inflammatory markers and UA.   Theron Arista, PA-C 11/21/22 1310

## 2022-11-21 NOTE — ED Provider Notes (Signed)
[facility] EMERGENCY DEPARTMENT Provider Note   CSN: [id number] Arrival date & time: 11/21/22  1200     History Myositis  Chief Complaint  Patient presents with   Abnormal Lab    Kayla Adkins is a 41 y.o. female.  41 year old female with a past medical history of myositis presents to the ED with abnormal labs.  Patient reports she was diagnosed with influenza approximately 1 week ago.  She reports feeling overall well however noticed to have some ongoing cramping and pain to bilateral lower extremities.  She describes these as "like I have been running ", states that she had similar symptoms in the past when her CK was elevated.  She does have a prior history of myositis, due to an unknown cause. She has been recovering well from her influenza and fever free for several days. No chest pain,no shortness of breath, no other complaints.   The history is provided by the patient and medical records.  Abnormal Lab Time since result:  24 hours Patient referred by:  PCP Resulting agency:  External      Home Medications Prior to Admission medications   Medication Sig Start Date End Date Taking? Authorizing Provider  acetaminophen (TYLENOL) 500 MG tablet Take 1 tablet (500 mg total) by mouth every 6 (six) hours as needed for mild pain. 03/30/22  Yes Redwine, Madison A, PA-C  amLODipine-olmesartan (AZOR) 5-20 MG tablet Take 1 tablet by mouth daily. 11/20/22  Yes Leigh Aurora, DO  meclizine (ANTIVERT) 25 MG tablet Take 1 tablet (25 mg total) by mouth 3 (three) times daily as needed for dizziness. 03/08/22  Yes Demaio, Alexa, MD  potassium chloride (KLOR-CON) 10 MEQ tablet Take 2 tablets (20 mEq total) by mouth daily for 10 days. 11/19/22 11/29/22 Yes Trifan, Carola Rhine, MD  dextromethorphan (ROBITUSSIN 12 HOUR COUGH) 30 MG/5ML liquid Take 2.5 mLs (15 mg total) by mouth as needed for up to 10 days for cough. Patient not taking: Reported on 11/21/2022 11/20/22 11/30/22  Leigh Aurora, DO  ibuprofen (ADVIL) 600 MG tablet Take 1 tablet (600 mg total) by mouth every 6 (six) hours as needed. Patient not taking: Reported on 11/21/2022 03/30/22   Redwine, Madison A, PA-C      Allergies    Patient has no known allergies.    Review of Systems   Review of Systems  Constitutional:  Negative for fever.  HENT:  Negative for sore throat.   Respiratory:  Negative for shortness of breath.   Cardiovascular:  Negative for chest pain.  Gastrointestinal:  Negative for abdominal pain, nausea and vomiting.  Genitourinary:  Negative for flank pain.  Musculoskeletal:  Positive for myalgias.  Neurological:  Negative for numbness and headaches.  All other systems reviewed and are negative.   Physical Exam Updated Vital Signs BP (!) 140/100   Pulse 86   Temp 98.4 F (36.9 C) (Oral)   Resp 17   SpO2 100%  Physical Exam Vitals and nursing note reviewed.  Constitutional:      General: She is not in acute distress.    Appearance: She is well-developed.  HENT:     Head: Normocephalic and atraumatic.     Mouth/Throat:     Pharynx: No oropharyngeal exudate.  Eyes:     Pupils: Pupils are equal, round, and reactive to light.  Cardiovascular:     Rate and Rhythm: Regular rhythm.     Heart sounds: Normal heart sounds.     Comments: No pitting  edema, BL calf tenderness.  Pulmonary:     Effort: Pulmonary effort is normal. No respiratory distress.     Breath sounds: Normal breath sounds.     Comments: Lungs are clear throughout.  Abdominal:     General: Bowel sounds are normal. There is no distension.     Palpations: Abdomen is soft.     Tenderness: There is no abdominal tenderness.  Musculoskeletal:        General: No tenderness or deformity.     Cervical back: Normal range of motion.     Right lower leg: No edema.     Left lower leg: No edema.  Skin:    General: Skin is warm and dry.  Neurological:     Mental Status: She is alert and oriented to person, place, and time.      ED Results / Procedures / Treatments   Labs (all labs ordered are listed, but only abnormal results are displayed) Labs Reviewed  CBC WITH DIFFERENTIAL/PLATELET - Abnormal; Notable for the following components:      Result Value   Hemoglobin 11.1 (*)    HCT 35.7 (*)    Neutro Abs 1.4 (*)    All other components within normal limits  SEDIMENTATION RATE - Abnormal; Notable for the following components:   Sed Rate 51 (*)    All other components within normal limits  C-REACTIVE PROTEIN - Abnormal; Notable for the following components:   CRP 1.0 (*)    All other components within normal limits  CK - Abnormal; Notable for the following components:   Total CK >50,000 (*)    All other components within normal limits  URINALYSIS, ROUTINE W REFLEX MICROSCOPIC - Abnormal; Notable for the following components:   Hgb urine dipstick LARGE (*)    Protein, ur 100 (*)    Bacteria, UA RARE (*)    All other components within normal limits  RENAL FUNCTION PANEL - Abnormal; Notable for the following components:   Potassium 3.2 (*)    Phosphorus 2.3 (*)    All other components within normal limits  HIV ANTIBODY (ROUTINE TESTING W REFLEX)  CBC  CREATININE, SERUM  BASIC METABOLIC PANEL  CBC  I-STAT BETA HCG BLOOD, ED ([facility], [facility], [facility] ONLY)    EKG None  Radiology No results found.  Procedures Procedures    Medications Ordered in ED Medications  sodium chloride 0.9 % bolus 250 mL (has no administration in time range)  enoxaparin (LOVENOX) injection 40 mg (has no administration in time range)  sodium chloride 0.9 % bolus 1,000 mL (1,000 mLs Intravenous New Bag/Given 11/21/22 1736)  sodium chloride 0.9 % bolus 1,000 mL (1,000 mLs Intravenous New Bag/Given 11/21/22 1845)  potassium chloride (KLOR-CON) packet 40 mEq (40 mEq Oral Given 11/21/22 1932)    ED Course/ Medical Decision Making/ A&P Clinical Course as of 11/21/22 1947  Wed Nov 21, 2022  1901 CK Total(!): >50,000 Improved from  yesterday's 64,000 [JS]  1901 WBC, UA: 6-10 No urinary symptoms [JS]  1901 Potassium(!): 3.2 Given oral dose of potassium [JS]    Clinical Course User Index [JS] Claude Manges, PA-C                           Medical Decision Making Amount and/or Complexity of Data Reviewed Labs: ordered. Decision-making details documented in ED Course.  Risk Prescription drug management. Decision regarding hospitalization.   This patient presents to the ED for concern of  myalgias, this involves a number of treatment options, and is a complaint that carries with it a high risk of complications and morbidity.  The differential diagnosis includes rhabdomyolysis in the setting of viral illness versus trauma.     Co morbidities: Discussed in HPI   Brief History:  Patient here with underlying history of myositis with bodyaches, myalgias after being diagnosed with influenza approximately 1 week ago.  Had CK level of 64,000 yesterday in clinic.  Continues to have bilateral lower extremity pain.   EMR reviewed including pt PMHx, past surgical history and past visits to ER.   See HPI for more details   Lab Tests:  I ordered and independently interpreted labs.  The pertinent results include:    Labs notable for an unremarkable CBC, hemoglobin slightly decreased.  CRP is 1.  UA with some rare bacteria but no white blood cell count, she does not have any urinary symptoms.  CRP elevated 1.CMP with a normal creatine no electrolyte derangement. Given oral potassium for a K of 3.2. HCG is negative.   Imaging Studies:  No imaging studies ordered for this patient  Cardiac Monitoring:  NA NA  Medicines ordered:  I ordered medication including bolus  for elevated CK  Reevaluation of the patient after these medicines showed that the patient stayed the same I have reviewed the patients home medicines and have made adjustments as needed  Reevaluation:  After the interventions noted above I  re-evaluated patient and found that they have :stayed the same   Social Determinants of Health:  The patient's social determinants of health were a factor in the care of this patient   Problem List / ED Course:  Patient here with underlying myositis complaining of bilateral lower extremity cramping that is been ongoing for the past couple days.  Was diagnosed with influenza approximately 1 week ago, reports that she had her CK checked yesterday at her PCPs office and they called her today with an alarming level of 64,000.  She does report a nontraumatic history of myositis this is why she asked for her CK to be drawn.  On today's visit she feels that her legs have been cramping kind of like she has been "going on the road".  She does not have any fevers, no chest pain, no shortness of breath or other symptoms. Labs reviewed by me with a CBC that is unremarkable.  Renal function slight decrease in potassium and creatinine levels within normal limits.  hCG is negative.  UA with some large hemoglobin, 100 protein and rare bacteria but no urinary symptoms at this time.  A CK was repeated and is greater than 50,000 today.  She was given 2 L bolus, along with started on maintenance fluid.  Sadly there was a delay on having patient admitted as she did not have a CMP drawn while in the waiting room.  I called internal medicine at this time in order to have patient admitted for acute rhabdo.  No hx of trauma.   Dispostion:  After consideration of the diagnostic results and the patients response to treatment, I feel that the patent would benefit from admission.   7:46 PM Spoke to internal medicine service, appreciate their assistance. Patient hemodynamically stable for admission.     Portions of this note were generated with L[employer]. Dictation errors may occur despite best attempts at proofreading.   Final Clinical Impression(s) / ED Diagnoses Final diagnoses:  Non-traumatic  rhabdomyolysis    Rx /  DC Orders ED Discharge Orders     None         Janeece Fitting, Hershal Coria 11/21/22 1947    Godfrey Pick, MD 11/21/22 [id number]

## 2022-11-21 NOTE — H&P (Cosign Needed Addendum)
Date: 11/21/2022               Patient Name:  Kayla Adkins MRN: [id number]  DOB: 16-Sep-1981 Age / Sex: 41 y.o., female   PCP: Farrel Gordon, DO         Medical Service: Internal Medicine Teaching Service         Attending Physician: Dr. Godfrey Pick, MD    First Contact: [place] Nickel, MD      Pager: [phone]      Second Contact: Delene Ruffini, MD      Pager: Lysbeth Penner [phone]           After Hours (After 5p/  First Contact Pager: [phone]  weekends / holidays): Second Contact Pager: [phone]   SUBJECTIVE   Chief Complaint: muscle aches, elevated CK   History of Present Illness:  [name] is a 41 year old female with a past medical history of hypertension, myositis, and recent influenza A infection who presents with muscle aches and elevated creatine kinase level.   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 At that time, she was hospitalized for several weeks and treated with prednisone. Her rheumatologist weaned her off after she reported feeling unwanted side effects including lethargy and fatigue. Neither an etiology or inciting event have been identified, though physiologic and psychological stressors are likely precipitating factors. Since then, she has not experienced any major flares but has her CK levels checked about every three months out of precaution.  She describes starting to feel ill with flu-like symptoms including fever, cough, and chills about eight days ago. She then presented to the ED on 12-17 where she tested positive for influenza A. At that time, her potassium was low due to poor oral intake and concurrent nausea, vomiting, and diarrhea. She was subsequently sent home with dextromorphan, which she never retrieved, and potassium chloride, which she took twice at home. Throughout this time period, she started to develop muscle aches and tightness in her bilateral lower extremities that closely resemble what  she experienced in 2019 when she was diagnosed with myositis. She describes the discomfort as a deep, throbbing, heavy sensation about 4/10 in severity. Additionally, she noticed dark-colored urine about three days ago and has started to feel some ear pain, which also manifested in 2019.  On 12-19, she presented to the [facility] for persistence of her upper respiratory symptoms and was found to have CK level elevated to [address]. She was then sent to the [facility] ED for escalation of care. Since the onset of her symptoms, the lower extremity muscle discomfort has remained about the same in terms of severity.     ED Course: Upon arrival to the ED, vital signs were notable for BP 151/102. Laboratory testing demonstrated Gluc 111, K 3.3, Hgb 11.1, CK 69K, and CRP 1.0. Urinalysis positive for hemoglobin, protein, and rare bacteria. Intravenous fluids 1L were administered.    Meds:  Current Meds  Medication Sig   acetaminophen (TYLENOL) 500 MG tablet Take 1 tablet (500 mg total) by mouth every 6 (six) hours as needed for mild pain.   amLODipine-olmesartan (AZOR) 5-20 MG tablet Take 1 tablet by mouth daily.   meclizine (ANTIVERT) 25 MG tablet Take 1 tablet (25 mg total) by mouth 3 (three) times daily as needed for dizziness.   potassium chloride (KLOR-CON) 10 MEQ tablet Take 2 tablets (20 mEq total) by mouth daily for 10 days.    Past Medical History  Past Surgical History:  Procedure Laterality Date   CESAREAN SECTION     MUSCLE BIOPSY Right 07/24/2018   Procedure: MUSCLE BIOPSY THIGH;  Surgeon: Judeth Horn, MD;  Location: [facility];  Service: General;  Laterality: Right;     Social:  Lives With: husband and daughter  Occupation: Freight forwarder at [employer] Support: family  Level of Function: independent in all ADLs and IADLs PCP: Farrel Gordon DO Substances: occasional alcohol and prior marijuana use, no tobacco or cocaine   Family History:   Diabetes and hypertension on both sides, no  myositis   Allergies: Allergies as of 11/21/2022   (No Known Allergies)      Review of Systems: A complete ROS was negative except as per HPI.    OBJECTIVE:   Physical Exam: Blood pressure (!) 150/101, pulse 73, temperature 98.4 F (36.9 C), temperature source Oral, resp. rate 17, SpO2 100 %.   General:      awake and alert, lying comfortably in bed, cooperative, not in acute distress Skin:       warm and dry, intact without any obvious lesions or scars, no rashes Eyes:      extraocular movements intact, conjunctivae pink, pupils round, no periorbital swelling or scleral icterus Lungs:      normal respiratory effort, breathing unlabored, symmetrical chest rise, no crackles or wheezing Cardiac:      regular rate and rhythm, normal S1 and S2, no pitting edema Abdomen:      soft and non-distended, normoactive bowel sounds, no tenderness to palpation or guarding Musculoskeletal:  motor strength 5 /5 in all lower extremities bilaterally Neurologic:      oriented to person-place-time, moving all extremities, no gross focal deficits Psychiatric:      euthymic mood with appropriate affect, intelligible speech   Labs: CBC    Component Value Date/Time   WBC 4.8 11/21/2022 1416   RBC 4.06 11/21/2022 1416   HGB 11.1 (L) 11/21/2022 1416   HGB 10.2 (L) 04/24/2022 1635   HCT 35.7 (L) 11/21/2022 1416   HCT 31.9 (L) 04/24/2022 1635   PLT 239 11/21/2022 1416   PLT 282 04/24/2022 1635   MCV 87.9 11/21/2022 1416   MCV 87 04/24/2022 1635   MCH 27.3 11/21/2022 1416   MCHC 31.1 11/21/2022 1416   RDW 15.5 11/21/2022 1416   RDW 15.4 04/24/2022 1635   LYMPHSABS 2.8 11/21/2022 1416   LYMPHSABS 2.5 04/24/2022 1635   MONOABS 0.5 11/21/2022 1416   EOSABS 0.0 11/21/2022 1416   EOSABS 0.1 04/24/2022 1635   BASOSABS 0.0 11/21/2022 1416   BASOSABS 0.0 04/24/2022 1635     CMP     Component Value Date/Time   NA 141 11/20/2022 1634   K 3.3 (L) 11/20/2022 1634   CL 101 11/20/2022  1634   CO2 25 11/20/2022 1634   GLUCOSE 111 (H) 11/20/2022 1634   GLUCOSE 115 (H) 11/18/2022 1144   BUN 7 11/20/2022 1634   CREATININE 0.66 11/20/2022 1634   CALCIUM 9.5 11/20/2022 1634   PROT 6.8 04/24/2022 1635   ALBUMIN 4.2 04/24/2022 1635   AST 11 04/24/2022 1635   ALT 9 04/24/2022 1635   ALKPHOS 78 04/24/2022 1635   BILITOT <0.2 04/24/2022 1635   GFRNONAA >60 11/18/2022 1144   GFRAA 126 09/02/2020 1158     Imaging:  No results found.    ECG:  none    ASSESSMENT & PLAN:   Assessment & Plan by Problem: Active Problems:   * No active hospital problems. *  Shaletha Humble is a 41 y.o. female living with a history of hypertension, myositis, and recent influenza A infection who presented with muscle aches and elevated creatine kinase level, now admitted for management of myositis flare.   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 Diagnostic workup including myositis-specific antibodies has been negative, however, dermatomyositis and polymyositis are still possibilities. She was evaluated at [facility] on 12-19, where laboratory testing [place] [address], and then subsequently redirected to the ED. Upon arrival, CK was elevated above 50000 and CRP-ESR both elevated. Urinalysis positive for protein and hemoglobin. Low concern for compartment syndrome given palpable pedal pulses, mild pain level, normal coloration, and intact strength-sensation. Given elevated CK and characteristic myalgias coinciding with influenza A infection, her symptoms are almost certainly a manifestation of myositis. Treatment with intravenous fluids and prednisone have been initiated. > Intravenous NS bolus > Methylprednisolone 156m IV q24, last dose 12-23 then prednisone > Trend CK q24   ---Influenza A infection Patient presented to the ED on 12-17 with cough and sore throat, tested positive for influenza A. She was prescribed  dextromethorphan for symptomatic relief. Upon arrival, patient was afebrile and denied any upper respiratory symptoms. > Monitor clinically > Trend CBC q24   ---Hypertension Patient has history of hypertension, medications recently switched from amlodipine and metoprolol to combination amlodipine-olmesartan. She has yet to start this new regimen. Upon arrival, her blood pressure was elevated and amlodipine started for better control. Primary team is holding olmesartan to preserve renal function in setting of elevated CK and rhabdomyolysis.  > Amlodipine 545mq24 > Hold olmesartan 5-2031m24   ---Hypokalemia Patient reports recent history of diarrhea and potassium on 12-17 was 2.7. She was discharged from the ED with potassium chloride and subsequent value on 12-17 was 3.3. Upon arrival on 12-19, her potassium was 3.2 and supplements were administered. > Trend BMP q24 > Replete potassium as needed to maintain K>3.4    Diet: Normal VTE: Enoxaparin IVF: NS,Bolus Code: Full  Prior to Admission Living Arrangement: Home, living with father and daughter Anticipated Discharge Location: Home Barriers to Discharge: medical management of myositis  Dispo: Admit patient to Inpatient with expected length of stay greater than 2 midnights.  Signed: HarSerita ButcherD Internal Medicine Resident PGY-1  11/21/2022, 5:40 PM

## 2022-11-21 NOTE — ED Triage Notes (Signed)
Pt came in POV d/t abnormal CK level being 80881. Pt received call from her pcp this morning who instructed the pt to come in to get admitted. Pt stated she had hx of abnormal high ck level in the past. Pt just diagnosed w flu. A&O X4.

## 2022-11-21 NOTE — Hospital Course (Addendum)
Kayla Adkins is a 41 y.o. female with PMH of myositis who presents after laboratory monitoring revealed CK elevation to [phone] and elevated creatinine in setting of recent influenza A infection, admitted for rhabdomyolysis with liver enzyme elevation secondary to viral myositis.    Rhabdomyolysis Viral myositis Influenza A Patient was evaluated in outpatient setting after course of uncomplicated flu.  Because she has a history of myositis complicated by rhabdomyolysis, she requested CK levels to be obtained.  They were elevated at approximately 70,000.  She was directed to go to the emergency department.  She was admitted for treatment of rhabdomyolysis due to viral myositis and prevention of pigment induced AKI.  She was treated with copious IV fluids  without any signs of volume overload.  Her creatinine remained within normal limits and stable for the duration of her hospitalization.  She was discharged in good condition with CK of 9220. Plan for f/u in 1 week at [facility] either office visit if available otherwise lab-only visit to repeat CMP and CK.   HTN Mostly normotensive but at times slightly elevated over hospital course. Will continue home amlodipine-olmesartan.   Hypokalemia Presented with asymptomatic hypokalemia which was treated with potassium supplementation. Potassium 3.7 at time of discharge.

## 2022-11-21 NOTE — ED Notes (Addendum)
Pt reports being diagnosed with flu last Tuesday (12/12). Pt reports over all feeling well, but noticed feelings of fatigue/weakness in legs and discomfort in her left ear - both similar symptoms to the last time her CK became elevated. PT is alert and oriented at this time. Husband at bedside.

## 2022-11-21 NOTE — Telephone Encounter (Addendum)
CRITICAL VALUE STICKER  CRITICAL VALUE: CK 69,714  RECEIVER (on-site recipient of call): Alecia Lemming, RN  DATE & TIME NOTIFIED: 11/21/22 @ 0858  MESSENGER (representative from lab): Inetta Fermo with [facility]  MD NOTIFIED: Modena Slater, DO  TIME OF NOTIFICATION: [phone] via phone  L. Ottie Tillery, BSN, RN-BC

## 2022-11-22 DIAGNOSIS — M6008 Infective myositis, other site: Secondary | ICD-10-CM | POA: Diagnosis not present

## 2022-11-22 DIAGNOSIS — N920 Excessive and frequent menstruation with regular cycle: Secondary | ICD-10-CM | POA: Diagnosis not present

## 2022-11-22 DIAGNOSIS — M60009 Infective myositis, unspecified site: Secondary | ICD-10-CM

## 2022-11-22 DIAGNOSIS — M60005 Infective myositis, unspecified leg: Secondary | ICD-10-CM | POA: Diagnosis not present

## 2022-11-22 DIAGNOSIS — M6282 Rhabdomyolysis: Secondary | ICD-10-CM | POA: Diagnosis not present

## 2022-11-22 DIAGNOSIS — E876 Hypokalemia: Secondary | ICD-10-CM | POA: Diagnosis not present

## 2022-11-22 DIAGNOSIS — J101 Influenza due to other identified influenza virus with other respiratory manifestations: Secondary | ICD-10-CM | POA: Diagnosis not present

## 2022-11-22 DIAGNOSIS — M609 Myositis, unspecified: Secondary | ICD-10-CM | POA: Diagnosis not present

## 2022-11-22 DIAGNOSIS — B9789 Other viral agents as the cause of diseases classified elsewhere: Secondary | ICD-10-CM | POA: Diagnosis not present

## 2022-11-22 DIAGNOSIS — R7989 Other specified abnormal findings of blood chemistry: Secondary | ICD-10-CM | POA: Diagnosis not present

## 2022-11-22 DIAGNOSIS — I10 Essential (primary) hypertension: Secondary | ICD-10-CM | POA: Diagnosis not present

## 2022-11-22 DIAGNOSIS — R748 Abnormal levels of other serum enzymes: Secondary | ICD-10-CM | POA: Diagnosis not present

## 2022-11-22 DIAGNOSIS — R7401 Elevation of levels of liver transaminase levels: Secondary | ICD-10-CM | POA: Diagnosis not present

## 2022-11-22 LAB — LACTATE DEHYDROGENASE: LDH: 2045 U/L — ABNORMAL HIGH (ref 98–192)

## 2022-11-22 LAB — CK
Total CK: >50000 U/L — ABNORMAL HIGH (ref 38–234)
Total CK: >50000 U/L — ABNORMAL HIGH (ref 38–234)
Total CK: >50000 U/L — ABNORMAL HIGH (ref 38–234)

## 2022-11-22 LAB — BASIC METABOLIC PANEL
Anion gap: 6 (ref 5–15)
BUN: 5 mg/dL — ABNORMAL LOW (ref 6–20)
CO2: 26 mmol/L (ref 22–32)
Calcium: 9.2 mg/dL (ref 8.9–10.3)
Chloride: 106 mmol/L (ref 98–111)
Creatinine, Ser: 0.63 mg/dL (ref 0.44–1.00)
GFR, Estimated: >60 mL/min (ref >60)
Glucose, Bld: 118 mg/dL — ABNORMAL HIGH (ref 70–99)
Potassium: 3.3 mmol/L — ABNORMAL LOW (ref 3.5–5.1)
Sodium: 138 mmol/L (ref 135–145)

## 2022-11-22 LAB — CBC
HCT: 33.9 % — ABNORMAL LOW (ref 36.0–46.0)
Hemoglobin: 10.9 g/dL — ABNORMAL LOW (ref 12.0–15.0)
MCH: 27.5 pg (ref 26.0–34.0)
MCHC: 32.2 g/dL (ref 30.0–36.0)
MCV: 85.4 fL (ref 80.0–100.0)
Platelets: 231 10*3/uL (ref 150–400)
RBC: 3.97 MIL/uL (ref 3.87–5.11)
RDW: 15.6 % — ABNORMAL HIGH (ref 11.5–15.5)
WBC: 4.1 10*3/uL (ref 4.0–10.5)
nRBC: 0 % (ref 0.0–0.2)

## 2022-11-22 LAB — HEPATIC FUNCTION PANEL
ALT: 132 U/L — ABNORMAL HIGH (ref 0–44)
AST: 514 U/L — ABNORMAL HIGH (ref 15–41)
Albumin: 3.5 g/dL (ref 3.5–5.0)
Alkaline Phosphatase: 59 U/L (ref 38–126)
Bilirubin, Direct: <0.1 mg/dL (ref 0.0–0.2)
Total Bilirubin: 0.5 mg/dL (ref 0.3–1.2)
Total Protein: 7.5 g/dL (ref 6.5–8.1)

## 2022-11-22 LAB — HIV ANTIBODY (ROUTINE TESTING W REFLEX): HIV Screen 4th Generation wRfx: NONREACTIVE

## 2022-11-22 MED ORDER — IBUPROFEN 200 MG PO TABS: 200.0000 mg | ORAL_TABLET | Freq: Four times a day (QID) | ORAL | Status: DC | PRN

## 2022-11-22 MED ORDER — ACETAMINOPHEN 500 MG PO TABS
1000.0000 mg | ORAL_TABLET | Freq: Four times a day (QID) | ORAL | Status: DC | PRN
  Administered 2022-11-23: 1000 mg via ORAL
  Filled 2022-11-22 (×2): qty 2

## 2022-11-22 MED ORDER — PREDNISONE 20 MG PO TABS: 40.0000 mg | ORAL_TABLET | Freq: Every day | ORAL | Status: DC

## 2022-11-22 MED ORDER — POTASSIUM CHLORIDE 20 MEQ PO PACK
40.0000 meq | PACK | Freq: Once | ORAL | Status: AC
  Administered 2022-11-22: 40 meq via ORAL
  Filled 2022-11-22: qty 2

## 2022-11-22 NOTE — Plan of Care (Signed)

## 2022-11-22 NOTE — Progress Notes (Signed)
Subjective:  Patient reports feeling better than yesterday when she was first admitted, slightly less muscle discomfort. Believes that the fluids have been helping. Discussed plan to continue fluids and contact [facility] Rheumatology team who evaluated her in 2020, which will help guide future workup. Patient expressed understanding of and agreement with this plan.    Objective: Vitals over previous 24hr: Vitals:   11/21/22 2153 11/22/22 0340 11/22/22 0805 11/22/22 1228  BP: (!) 144/98 (!) 150/91 (!) 151/88 (!) 153/108  Pulse: 86 81 90 100  Resp: _0 Temp: 98.9 F (37.2 C) 98.3 F (36.8 C) 98 F (36.7 C) 98 F (36.7 C)  TempSrc: Oral Oral Oral Oral  SpO2: 98% 100% 96% 98%    General:                       awake and alert, lying comfortably in bed, cooperative, not in acute distress Skin:                             warm and dry, intact without any obvious lesions or scars, no rashes Eyes:                            extraocular movements intact, conjunctivae pink, pupils round, no periorbital swelling or scleral icterus Lungs:                          normal respiratory effort, breathing unlabored, symmetrical chest rise, no crackles or wheezing Cardiac:                        regular rate and rhythm, normal S1 and S2, no pitting edema Abdomen:                     soft and non-distended, normoactive bowel sounds, no tenderness to palpation or guarding Musculoskeletal:          motor strength 5 /5 in lower extremities bilaterally Neurologic:                   oriented to person-place-time, moving all extremities, no gross focal deficits Psychiatric:                   euthymic mood with appropriate affect, intelligible speech    Assessment/Plan: Ms Rodick is a 41 year old female with a past medical history of hypertension, myositis, and recent influenza A infection who presented with muscle aches and elevated creatine kinase level, now admitted for management of  infectious myositis.    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 Diagnostic workup at that time including anti-DNA, anti-Jo, anti-scleroderma antibodies was negative. Since 2019, the patient has remained asymptomatic and she discontinued prednisone in 03-2022. She was recently evaluated at [facility] on 12-19 for cough and sore throat, where laboratory testing [place] of [address]. Upon arrival to the ED, CK was elevated above 50000 and CRP-ESR both elevated. Respiratory panel positive for influenza A infection. Urinalysis positive for protein and hemoglobin, though patient reports menstrual spotting. Given acute presentation of myalgias that temporally coincide with influenza A infection, her symptoms are most likely attributable to infectious  myositis. Treatment with intravenous fluids has been initiated. Primary team will trend laboratory values and quantify urine protein to evaluate for glomerulonephritis. > Intravenous LR at 213m/hr > Measure urine protein over 24hr > Trend CMP q24 > Trend CBC q24 > Trend LDH q24 > Trend CK q24 > Acetaminophen [id number] PRN     ---Hypertension Patient has history of hypertension, medications recently switched from amlodipine and metoprolol to combination amlodipine-olmesartan. She has yet to start this new regimen. Upon arrival, her blood pressure was elevated and amlodipine started for better control. Primary team is holding olmesartan to preserve renal function in setting of elevated CK and rhabdomyolysis.  > Amlodipine 61m62m24 > Hold olmesartan 5-76m53m4     ---Hypokalemia Patient reports recent history of diarrhea, and potassium on 12-17 was 2.7. She was discharged from the ED with potassium chloride and subsequent value on 12-17 was  3.3. Upon arrival, her potassium was 3.2 and has since improved to 3.3 after supplementation. > Trend BMP q24 > Replete potassium as needed to maintain K>3.4           Prior to Admission Living Arrangement: home with father and daughter Anticipated Discharge Location: home Barriers to Discharge: intravenous fluids and CK normalization Dispo: Anticipated discharge in approximately 2-3 day(s).    Dan [place] Nickel Internal Medicine PGY-1 Pager [phone] 5pm on weekdays and 1pm on weekends: On Call pager [phone]

## 2022-11-22 NOTE — Progress Notes (Signed)
Internal Medicine Clinic Attending  I saw and evaluated the patient.  I personally confirmed the key portions of the history and exam documented by Dr. Patel and I reviewed pertinent patient test results.  The assessment, diagnosis, and plan were formulated together and I agree with the documentation in the resident's note.  

## 2022-11-23 DIAGNOSIS — M60005 Infective myositis, unspecified leg: Secondary | ICD-10-CM | POA: Diagnosis not present

## 2022-11-23 LAB — CBC
HCT: 29.5 % — ABNORMAL LOW (ref 36.0–46.0)
Hemoglobin: 9.8 g/dL — ABNORMAL LOW (ref 12.0–15.0)
MCH: 27.9 pg (ref 26.0–34.0)
MCHC: 33.2 g/dL (ref 30.0–36.0)
MCV: 84 fL (ref 80.0–100.0)
Platelets: 258 10*3/uL (ref 150–400)
RBC: 3.51 MIL/uL — ABNORMAL LOW (ref 3.87–5.11)
RDW: 15.4 % (ref 11.5–15.5)
WBC: 10.4 10*3/uL (ref 4.0–10.5)
nRBC: 0 % (ref 0.0–0.2)

## 2022-11-23 LAB — GLUCOSE, CAPILLARY
Glucose-Capillary: 143 mg/dL — ABNORMAL HIGH (ref 70–99)
Glucose-Capillary: 145 mg/dL — ABNORMAL HIGH (ref 70–99)
Glucose-Capillary: 164 mg/dL — ABNORMAL HIGH (ref 70–99)
Glucose-Capillary: 222 mg/dL — ABNORMAL HIGH (ref 70–99)

## 2022-11-23 LAB — CBC WITH DIFFERENTIAL/PLATELET
Abs Immature Granulocytes: 0.06 10*3/uL (ref 0.00–0.07)
Basophils Absolute: 0 10*3/uL (ref 0.0–0.1)
Basophils Relative: 0 %
Eosinophils Absolute: 0 10*3/uL (ref 0.0–0.5)
Eosinophils Relative: 0 %
HCT: 29.3 % — ABNORMAL LOW (ref 36.0–46.0)
Hemoglobin: 9.9 g/dL — ABNORMAL LOW (ref 12.0–15.0)
Immature Granulocytes: 1 %
Lymphocytes Relative: 14 %
Lymphs Abs: 1.5 10*3/uL (ref 0.7–4.0)
MCH: 28.6 pg (ref 26.0–34.0)
MCHC: 33.8 g/dL (ref 30.0–36.0)
MCV: 84.7 fL (ref 80.0–100.0)
Monocytes Absolute: 0.5 10*3/uL (ref 0.1–1.0)
Monocytes Relative: 4 %
Neutro Abs: 8.5 10*3/uL — ABNORMAL HIGH (ref 1.7–7.7)
Neutrophils Relative %: 81 %
Platelets: 255 10*3/uL (ref 150–400)
RBC: 3.46 MIL/uL — ABNORMAL LOW (ref 3.87–5.11)
RDW: 15.5 % (ref 11.5–15.5)
WBC: 10.5 10*3/uL (ref 4.0–10.5)
nRBC: 0 % (ref 0.0–0.2)

## 2022-11-23 LAB — COMPREHENSIVE METABOLIC PANEL
ALT: 114 U/L — ABNORMAL HIGH (ref 0–44)
AST: 335 U/L — ABNORMAL HIGH (ref 15–41)
Albumin: 3.2 g/dL — ABNORMAL LOW (ref 3.5–5.0)
Alkaline Phosphatase: 46 U/L (ref 38–126)
Anion gap: 8 (ref 5–15)
BUN: 6 mg/dL (ref 6–20)
CO2: 27 mmol/L (ref 22–32)
Calcium: 9.2 mg/dL (ref 8.9–10.3)
Chloride: 106 mmol/L (ref 98–111)
Creatinine, Ser: 0.59 mg/dL (ref 0.44–1.00)
GFR, Estimated: >60 mL/min (ref >60)
Glucose, Bld: 152 mg/dL — ABNORMAL HIGH (ref 70–99)
Potassium: 3.7 mmol/L (ref 3.5–5.1)
Sodium: 141 mmol/L (ref 135–145)
Total Bilirubin: 0.5 mg/dL (ref 0.3–1.2)
Total Protein: 6.8 g/dL (ref 6.5–8.1)

## 2022-11-23 LAB — LACTATE DEHYDROGENASE: LDH: 961 U/L — ABNORMAL HIGH (ref 98–192)

## 2022-11-23 LAB — CK
Total CK: >50000 U/L — ABNORMAL HIGH (ref 38–234)
Total CK: >50000 U/L — ABNORMAL HIGH (ref 38–234)

## 2022-11-23 LAB — TECHNOLOGIST SMEAR REVIEW

## 2022-11-23 MED ORDER — ACETAMINOPHEN 325 MG PO TABS: 650.0000 mg | ORAL_TABLET | Freq: Four times a day (QID) | ORAL | Status: DC | PRN

## 2022-11-23 NOTE — Progress Notes (Addendum)
Subjective:  Patient reports feeling better than yesterday, only mild achiness in her calves. Expressed strong desire to return home and visit with family over the Christmas holiday. Communicated importance of intravenous fluids and laboratory monitoring to prevent renal failure. Discussed plan to start measuring urine output and titrate fluids accordingly. Patient understands the situation and agrees to stay for longer, hoping to improve so that she can return home soon.   Objective: Vitals over previous 24hr: Vitals:   11/22/22 1634 11/22/22 2036 11/23/22 0456 11/23/22 0832  BP: (!) 151/98 124/85 (!) 147/98 (!) 145/91  Pulse: 98 100 97 92  Resp:  _0 Temp: 98.5 F (36.9 C) 98.6 F (37 C) (!) 97.5 F (36.4 C) 98.9 F (37.2 C)  TempSrc: Oral Oral Oral   SpO2: 99% 100% 99% 96%    General:                       awake and alert, lying comfortably in bed, cooperative, not in acute distress Skin:                             warm and dry, intact without any obvious lesions or scars, no rashes Eyes:                            extraocular movements intact, conjunctivae pink, pupils round, no periorbital swelling or scleral icterus Lungs:                          normal respiratory effort, breathing unlabored, symmetrical chest rise, no crackles or wheezing Cardiac:                        regular rate and rhythm, normal S1 and S2, no pitting edema Abdomen:                     soft and non-distended, normoactive bowel sounds, no tenderness to palpation or guarding Musculoskeletal:          motor strength 5 /5 in lower extremities bilaterally Neurologic:                   oriented to person-place-time, moving all extremities, no gross focal deficits Psychiatric:                   euthymic mood with appropriate affect, intelligible speech    Assessment/Plan: Ms Newman is a 41 year old female with a past medical history of hypertension, myositis, and recent influenza A  infection who presented with muscle aches and elevated creatine kinase level, now admitted for management of infectious myositis.    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 Diagnostic workup at that time including anti-DNA, anti-Jo, anti-scleroderma antibodies was negative. Since 2019, the patient has remained asymptomatic and she discontinued prednisone in 03-2022. She was recently evaluated at [facility] on 12-19 for cough and sore throat, where laboratory testing [place] of [address]. Upon arrival to the ED, CK was >50000 and CRP-ESR both elevated. Transaminases elevated to 514 AST and 132 ALT. Respiratory panel positive for influenza A infection. Urinalysis positive for protein and hemoglobin, though patient reports  menstrual spotting. Given acute presentation of myalgias that temporally coincide with influenza A infection, her symptoms are most likely attributable to infectious myositis. Treatment with intravenous fluids has been initiated. Primary team will trend laboratory values and quantify urine protein to evaluate for glomerulonephritis. Although CK remains >50000, transaminases and LDH have been down-trending since initiation of fluids. > Intravenous LR at 370m/hr > Consider adding prednisone if CK remains elevated on 12-23 > Monitor ins-outs, goal total output 3018mhr > Measure urine protein over 24hr > Trend CMP q24 > Trend CBC q24 > Trend LDH q24 > Trend CK q24 > Peripheral blood smear > Acetaminophen 100058m6 PRN     ---Hypertension Patient has history of hypertension, medications recently switched from amlodipine and metoprolol to combination amlodipine-olmesartan. She has yet to start this new regimen. Upon arrival to the ED, her blood pressure was elevated which  prompted initiation of amlodipine for better control. Primary team is holding olmesartan to preserve renal function in setting of elevated CK and rhabdomyolysis.  > Amlodipine 5mg45m4 > Hold olmesartan 5-20mg9m     ---Hypokalemia Patient reports recent history of diarrhea and subsequent potassium check on 12-17 revealed K 2.7. She was discharged from the ED with potassium chloride and a recheck value on 12-17 was 3.3. Upon arrival, her potassium was 3.2 and has since improved to 3.7 after supplementation. > Trend BMP q24 > Replete potassium as needed to maintain K>3.4           Prior to Admission Living Arrangement: home with father and daughter Anticipated Discharge Location: home Barriers to Discharge: intravenous fluids and CK improvement Dispo: Anticipated discharge in approximately 2-3 day(s).    Dan HRoswell NickelInternal Medicine PGY-1 Pager [phone] 5pm on weekdays and 1pm on weekends: On Call pager [phone]

## 2022-11-24 DIAGNOSIS — B9789 Other viral agents as the cause of diseases classified elsewhere: Secondary | ICD-10-CM | POA: Diagnosis not present

## 2022-11-24 DIAGNOSIS — R7401 Elevation of levels of liver transaminase levels: Secondary | ICD-10-CM | POA: Diagnosis not present

## 2022-11-24 DIAGNOSIS — M6282 Rhabdomyolysis: Secondary | ICD-10-CM | POA: Diagnosis not present

## 2022-11-24 DIAGNOSIS — M60009 Infective myositis, unspecified site: Secondary | ICD-10-CM | POA: Diagnosis not present

## 2022-11-24 LAB — COMPREHENSIVE METABOLIC PANEL
ALT: 134 U/L — ABNORMAL HIGH (ref 0–44)
AST: 412 U/L — ABNORMAL HIGH (ref 15–41)
Albumin: 3.1 g/dL — ABNORMAL LOW (ref 3.5–5.0)
Alkaline Phosphatase: 46 U/L (ref 38–126)
Anion gap: 6 (ref 5–15)
BUN: 5 mg/dL — ABNORMAL LOW (ref 6–20)
CO2: 25 mmol/L (ref 22–32)
Calcium: 8.8 mg/dL — ABNORMAL LOW (ref 8.9–10.3)
Chloride: 105 mmol/L (ref 98–111)
Creatinine, Ser: 0.61 mg/dL (ref 0.44–1.00)
GFR, Estimated: >60 mL/min (ref >60)
Glucose, Bld: 139 mg/dL — ABNORMAL HIGH (ref 70–99)
Potassium: 2.9 mmol/L — ABNORMAL LOW (ref 3.5–5.1)
Sodium: 136 mmol/L (ref 135–145)
Total Bilirubin: 0.4 mg/dL (ref 0.3–1.2)
Total Protein: 6.2 g/dL — ABNORMAL LOW (ref 6.5–8.1)

## 2022-11-24 LAB — CBC
HCT: 32.5 % — ABNORMAL LOW (ref 36.0–46.0)
Hemoglobin: 10.7 g/dL — ABNORMAL LOW (ref 12.0–15.0)
MCH: 28.3 pg (ref 26.0–34.0)
MCHC: 32.9 g/dL (ref 30.0–36.0)
MCV: 86 fL (ref 80.0–100.0)
Platelets: 282 10*3/uL (ref 150–400)
RBC: 3.78 MIL/uL — ABNORMAL LOW (ref 3.87–5.11)
RDW: 15.8 % — ABNORMAL HIGH (ref 11.5–15.5)
WBC: 9.2 10*3/uL (ref 4.0–10.5)
nRBC: 0 % (ref 0.0–0.2)

## 2022-11-24 LAB — CK
Total CK: 46499 U/L — ABNORMAL HIGH (ref 38–234)
Total CK: >50000 U/L — ABNORMAL HIGH (ref 38–234)

## 2022-11-24 LAB — LACTATE DEHYDROGENASE: LDH: 995 U/L — ABNORMAL HIGH (ref 98–192)

## 2022-11-24 MED ORDER — POTASSIUM CHLORIDE 20 MEQ PO PACK
40.0000 meq | PACK | Freq: Two times a day (BID) | ORAL | Status: DC
  Filled 2022-11-24: qty 2

## 2022-11-24 MED ORDER — POTASSIUM CHLORIDE CRYS ER 20 MEQ PO TBCR
40.0000 meq | EXTENDED_RELEASE_TABLET | Freq: Two times a day (BID) | ORAL | Status: AC
  Administered 2022-11-24 (×2): 40 meq via ORAL
  Filled 2022-11-24 (×2): qty 2

## 2022-11-24 NOTE — Progress Notes (Addendum)
                 Interval history Afebrile.  Feels "great."  Reports some mild muscle soreness, likened to what one might experience after exercise.  No changes to urinary output.  She really, really wants to go home for Christmas.  Physical exam Blood pressure 121/74, pulse 79, temperature 98.1 F (36.7 C), temperature source Oral, resp. rate 19, SpO2 100 %.  Well-appearing, in no apparent acute distress Regular rate and rhythm Breathing is regular and unlabored It is warm and dry Calves are nontender Alert and oriented Pleasant, appropriate mood and affect   Intake/Output Summary (Last 24 hours) at 11/24/2022 1229 Last data filed at 11/24/2022 0859 Gross per 24 hour  Intake 34525.54 ml  Output --  Net 34525.54 ml    Labs Sodium 136 Potassium 2.9, decreased BUN/creatinine 5/0.61, stable AST/ALT 412/134, stable CK total 46,499, decreasing LDH 995, stable WBC 9.2 stable Hemoglobin 10.7 stable  Images and other studies No new images  Assessment and plan Hospital day 2  [place] Righetti is a 41 y.o. with history of myositis who presents after laboratory monitoring revealed CK elevation to 69,714 and elevated creatinine in setting of recent influenza A infection, admitted for treatment of viral myositis complicated by examination elevation.  Principal Problem:   Myositis Active Problems:   Hypertension   Hypokalemia   Influenza A  Viral myositis In the setting of influenza A infection.  Patient remains largely asymptomatic aside from some very mild "soreness" in her calves.  She is well-appearing on exam without muscle tenderness.  Liver enzymes remain elevated but stable.  Creatinine stable at baseline.  Plan is to continue IV hydration and washout of CK until levels reach 5-10,000.  We are not administering prednisone at this time since the cause is presumed to be infectious/viral. - Continue LR IV at 300 mL/h - Continue monitoring I's/O - Daily CK - Trend liver  enzymes  Hypertension Home regimen includes amlodipine-olmesartan.  ARB has been held preservation of renal function in setting of rhabdo. - Continue amlodipine 5 mg daily  Hypokalemia Potassium 2.9 today, down from 3.7 yesterday. - Replaced with 80 mEq p.o.  Diet: Regular IVF: LR at 300 mL/h VTE: Lovenox Code: Full code Family Update: At bedside  Discharge plan: Discharge home pending safe CK level.  Marrianne Mood MD 11/24/2022, 12:29 PM  Pager: [phone] After 5pm on weekdays and 1pm on weekends: [phone]

## 2022-11-24 NOTE — Plan of Care (Signed)
0700:Pt resting in bed at beginning of shift, pt alert and oriented x4, pt on room air, pt independent and ambulatory, pt call bell within reach and bed in lowest position.  Pt received oral potassium replacement during shift, pt refused blood sugar checks, RN notified Dr. Ned Card Pt not wanting blood sugar checks if possible and pt prefers to reduce sticks as much as possible  Pt had new IV placed by Iv team during shift

## 2022-11-25 DIAGNOSIS — R7401 Elevation of levels of liver transaminase levels: Secondary | ICD-10-CM

## 2022-11-25 DIAGNOSIS — M6282 Rhabdomyolysis: Secondary | ICD-10-CM

## 2022-11-25 DIAGNOSIS — B9789 Other viral agents as the cause of diseases classified elsewhere: Secondary | ICD-10-CM | POA: Diagnosis not present

## 2022-11-25 DIAGNOSIS — M60009 Infective myositis, unspecified site: Secondary | ICD-10-CM | POA: Diagnosis not present

## 2022-11-25 LAB — COMPREHENSIVE METABOLIC PANEL
ALT: 143 U/L — ABNORMAL HIGH (ref 0–44)
AST: 378 U/L — ABNORMAL HIGH (ref 15–41)
Albumin: 3.2 g/dL — ABNORMAL LOW (ref 3.5–5.0)
Alkaline Phosphatase: 47 U/L (ref 38–126)
Anion gap: 7 (ref 5–15)
BUN: 7 mg/dL (ref 6–20)
CO2: 23 mmol/L (ref 22–32)
Calcium: 8.9 mg/dL (ref 8.9–10.3)
Chloride: 104 mmol/L (ref 98–111)
Creatinine, Ser: 0.6 mg/dL (ref 0.44–1.00)
GFR, Estimated: >60 mL/min (ref >60)
Glucose, Bld: 113 mg/dL — ABNORMAL HIGH (ref 70–99)
Potassium: 3.7 mmol/L (ref 3.5–5.1)
Sodium: 134 mmol/L — ABNORMAL LOW (ref 135–145)
Total Bilirubin: 0.3 mg/dL (ref 0.3–1.2)
Total Protein: 6.3 g/dL — ABNORMAL LOW (ref 6.5–8.1)

## 2022-11-25 LAB — HAPTOGLOBIN: Haptoglobin: 239 mg/dL (ref 42–296)

## 2022-11-25 LAB — CBC
HCT: 35.6 % — ABNORMAL LOW (ref 36.0–46.0)
Hemoglobin: 11.6 g/dL — ABNORMAL LOW (ref 12.0–15.0)
MCH: 27.9 pg (ref 26.0–34.0)
MCHC: 32.6 g/dL (ref 30.0–36.0)
MCV: 85.6 fL (ref 80.0–100.0)
Platelets: 324 10*3/uL (ref 150–400)
RBC: 4.16 MIL/uL (ref 3.87–5.11)
RDW: 15.7 % — ABNORMAL HIGH (ref 11.5–15.5)
WBC: 8.2 10*3/uL (ref 4.0–10.5)
nRBC: 0 % (ref 0.0–0.2)

## 2022-11-25 LAB — CK: Total CK: 32846 U/L — ABNORMAL HIGH (ref 38–234)

## 2022-11-25 MED ORDER — RIVAROXABAN 10 MG PO TABS
10.0000 mg | ORAL_TABLET | Freq: Every day | ORAL | Status: DC
  Administered 2022-11-25 – 2022-11-30 (×6): 10 mg via ORAL
  Filled 2022-11-25 (×6): qty 1

## 2022-11-25 NOTE — Progress Notes (Signed)
                 Interval history No events overnight.  Continues to feel well.  Understands the importance of treatment for rhabdomyolysis to prevent irreversible kidney or liver damage.  However, becomes tearful when thinking about spending Christmas in the hospital.  Physical exam Blood pressure (!) 133/92, pulse 80, temperature 97.9 F (36.6 C), resp. rate 17, SpO2 99 %.  Well-appearing Respirations are regular and unlabored Skin is warm and dry Alert and oriented Mood is sad and frustrated, affect is concordant  Intake/Output Summary (Last 24 hours) at 11/25/2022 0645 Last data filed at 11/24/2022 1645 Gross per 24 hour  Intake 480 ml  Output --  Net 480 ml    Labs CK total > 50,000 Hemoglobin 11.6 stable CMP pending  Assessment and plan Hospital day 3  Kayla Adkins is a 41 y.o. with history of myositis who presents after laboratory monitoring revealed CK elevation to 69,714 and elevated creatinine in setting of recent influenza A infection, admitted for treatment of rhabdomyolysis with liver enzyme elevation secondary to viral myositis.  Principal Problem:   Myositis Active Problems:   Hypertension   Hypokalemia   Influenza A  Viral myositis Rhabdomyolysis with elevated liver enzymes In the setting of influenza A infection.  Patient remains asymptomatic.  Plan is to continue IV hydration and washout of CK until levels reach 5-10,000.  We are not administering prednisone at this time since the cause is presumed to be infectious/viral. - Continue LR IV at 300 mL/h - Continue monitoring I's/O - Daily CK - Trend liver enzymes   Hypertension Home regimen includes amlodipine-olmesartan.  ARB has been held preservation of renal function in setting of rhabdo. - Continue amlodipine 5 mg daily   Hypokalemia - Follow-up morning CMP, still pending  Diet: Regular IVF: LR at 300 mL/h VTE: Rivaroxaban 10 mg daily Code: Full code  Discharge plan: Pending  treatment of rhabdomyolysis due to viral myositis  Marrianne Mood MD 11/25/2022, 6:45 AM  Pager: [phone] After 5pm on weekdays and 1pm on weekends: [phone]

## 2022-11-26 DIAGNOSIS — M6282 Rhabdomyolysis: Secondary | ICD-10-CM | POA: Diagnosis not present

## 2022-11-26 DIAGNOSIS — M60009 Infective myositis, unspecified site: Secondary | ICD-10-CM | POA: Diagnosis not present

## 2022-11-26 DIAGNOSIS — R7401 Elevation of levels of liver transaminase levels: Secondary | ICD-10-CM | POA: Diagnosis not present

## 2022-11-26 DIAGNOSIS — B9789 Other viral agents as the cause of diseases classified elsewhere: Secondary | ICD-10-CM | POA: Diagnosis not present

## 2022-11-26 LAB — COMPREHENSIVE METABOLIC PANEL
ALT: 142 U/L — ABNORMAL HIGH (ref 0–44)
AST: 311 U/L — ABNORMAL HIGH (ref 15–41)
Albumin: 3 g/dL — ABNORMAL LOW (ref 3.5–5.0)
Alkaline Phosphatase: 46 U/L (ref 38–126)
Anion gap: 7 (ref 5–15)
BUN: 8 mg/dL (ref 6–20)
CO2: 25 mmol/L (ref 22–32)
Calcium: 8.9 mg/dL (ref 8.9–10.3)
Chloride: 104 mmol/L (ref 98–111)
Creatinine, Ser: 0.52 mg/dL (ref 0.44–1.00)
GFR, Estimated: >60 mL/min (ref >60)
Glucose, Bld: 109 mg/dL — ABNORMAL HIGH (ref 70–99)
Potassium: 3.7 mmol/L (ref 3.5–5.1)
Sodium: 136 mmol/L (ref 135–145)
Total Bilirubin: 0.2 mg/dL — ABNORMAL LOW (ref 0.3–1.2)
Total Protein: 5.9 g/dL — ABNORMAL LOW (ref 6.5–8.1)

## 2022-11-26 LAB — CK: Total CK: 33763 U/L — ABNORMAL HIGH (ref 38–234)

## 2022-11-26 LAB — LACTATE DEHYDROGENASE: LDH: 603 U/L — ABNORMAL HIGH (ref 98–192)

## 2022-11-26 NOTE — Progress Notes (Addendum)
                 Interval history Leg soreness improved. Reports better spirits since she was able to see her family yesterday.  Physical exam Blood pressure 125/87, pulse 85, temperature (!) 97.4 F (36.3 C), temperature source Oral, resp. rate 17, SpO2 100 %.  Well appearing, ambulating in room Breathing regular and unlabored Skin warm and dry Mood is positive, optimistic  Labs K within normal limits BUN/Cr stable Liver enzymes decreasing CK ~35,000 and decreasing LDH ~600 and decreasing  Assessment and plan Hospital day 4  Kayla Adkins is a 41 y.o.  with history of myositis who presents after laboratory monitoring revealed CK elevation to 69,714 and elevated creatinine in setting of recent influenza A infection, admitted for treatment of rhabdomyolysis with liver enzyme elevation secondary to viral myositis.   Principal Problem:   Viral myositis Active Problems:   Hypertension   Influenza A  Viral myositis Rhabdomyolysis with elevated liver enzymes In the setting of influenza A infection.  Patient remains asymptomatic.  Plan is to continue IV hydration and washout of CK until levels reach 5-10,000.  We are not administering prednisone at this time since the cause is presumed to be infectious/viral. - Continue LR IV at 300 mL/h - Continue monitoring I's/O - Daily CK, LDH - Trend liver enzymes   Hypertension Home regimen includes amlodipine-olmesartan.  ARB has been held preservation of renal function in setting of rhabdo. - Continue amlodipine 5 mg daily   Hypokalemia, resolved  Diet: Regular IVF: LR at 300 mL/h VTE: Rivaroxaban 10 mg daily Code: Full code   Discharge plan: Pending treatment of rhabdomyolysis due to viral myositis   Marrianne Mood MD 11/26/2022, 8:44 AM  Pager: [phone] After 5pm on weekdays and 1pm on weekends: [phone]

## 2022-11-27 DIAGNOSIS — M6282 Rhabdomyolysis: Secondary | ICD-10-CM | POA: Diagnosis not present

## 2022-11-27 DIAGNOSIS — M60009 Infective myositis, unspecified site: Secondary | ICD-10-CM | POA: Diagnosis not present

## 2022-11-27 DIAGNOSIS — R7401 Elevation of levels of liver transaminase levels: Secondary | ICD-10-CM | POA: Diagnosis not present

## 2022-11-27 LAB — COMPREHENSIVE METABOLIC PANEL
ALT: 142 U/L — ABNORMAL HIGH (ref 0–44)
AST: 289 U/L — ABNORMAL HIGH (ref 15–41)
Albumin: 2.9 g/dL — ABNORMAL LOW (ref 3.5–5.0)
Alkaline Phosphatase: 43 U/L (ref 38–126)
Anion gap: 8 (ref 5–15)
BUN: 8 mg/dL (ref 6–20)
CO2: 25 mmol/L (ref 22–32)
Calcium: 8.8 mg/dL — ABNORMAL LOW (ref 8.9–10.3)
Chloride: 103 mmol/L (ref 98–111)
Creatinine, Ser: 0.51 mg/dL (ref 0.44–1.00)
GFR, Estimated: >60 mL/min (ref >60)
Glucose, Bld: 104 mg/dL — ABNORMAL HIGH (ref 70–99)
Potassium: 3.5 mmol/L (ref 3.5–5.1)
Sodium: 136 mmol/L (ref 135–145)
Total Bilirubin: 0.3 mg/dL (ref 0.3–1.2)
Total Protein: 5.7 g/dL — ABNORMAL LOW (ref 6.5–8.1)

## 2022-11-27 LAB — CK: Total CK: 29590 U/L — ABNORMAL HIGH (ref 38–234)

## 2022-11-27 LAB — LACTATE DEHYDROGENASE: LDH: 607 U/L — ABNORMAL HIGH (ref 98–192)

## 2022-11-27 NOTE — Progress Notes (Signed)
                 Interval history Feeling well. No leg soreness.  Physical exam Blood pressure 133/86, pulse 98, temperature 98.9 F (37.2 C), temperature source Oral, resp. rate 16, SpO2 100 %.  Well appearing Breathing regular and unlabored Skin warm and dry Mood is positive, optimistic  Labs BUN/creatinine stable AST/ALT 289/142 [place] [address] LDH 607  Assessment and plan Hospital day 5  Kayla Adkins is a 41 y.o. with history of myositis who presents after laboratory monitoring revealed CK elevation to 69,714 and elevated creatinine in setting of recent influenza A infection, admitted for treatment of rhabdomyolysis with liver enzyme elevation secondary to viral myositis.   Principal Problem:   Rhabdomyolysis Active Problems:   Hypertension   Viral myositis   Influenza A  Viral myositis Rhabdomyolysis with elevated liver enzymes In the setting of influenza A infection.  Patient remains asymptomatic.  CK improving.  LFTs improving.  My impression is that the infection has resolved and muscle breakdown has ceased.  Plan is to continue IV hydration and washout of CK until levels reach 5-10,000.   - Continue LR IV at 300 mL/h - Continue monitoring I's/O - Daily CK, LDH - Trend liver enzymes   Hypertension Home regimen includes amlodipine-olmesartan.  ARB has been held preservation of renal function in setting of rhabdo. - Continue amlodipine 5 mg daily   Diet: Regular IVF: LR at 300 mL/h VTE: Rivaroxaban 10 mg daily Code: Full code   Discharge plan: Pending treatment of rhabdomyolysis due to viral myositis  Marrianne Mood MD 11/27/2022, 10:32 AM  Pager: [phone] After 5pm on weekdays and 1pm on weekends: [phone]

## 2022-11-28 ENCOUNTER — Encounter (HOSPITAL_COMMUNITY): Payer: Self-pay | Admitting: Internal Medicine

## 2022-11-28 DIAGNOSIS — R7401 Elevation of levels of liver transaminase levels: Secondary | ICD-10-CM | POA: Diagnosis not present

## 2022-11-28 DIAGNOSIS — M60009 Infective myositis, unspecified site: Secondary | ICD-10-CM | POA: Diagnosis not present

## 2022-11-28 DIAGNOSIS — M6282 Rhabdomyolysis: Secondary | ICD-10-CM | POA: Diagnosis not present

## 2022-11-28 LAB — COMPREHENSIVE METABOLIC PANEL
ALT: 121 U/L — ABNORMAL HIGH (ref 0–44)
AST: 217 U/L — ABNORMAL HIGH (ref 15–41)
Albumin: 2.8 g/dL — ABNORMAL LOW (ref 3.5–5.0)
Alkaline Phosphatase: 36 U/L — ABNORMAL LOW (ref 38–126)
Anion gap: 10 (ref 5–15)
BUN: 6 mg/dL (ref 6–20)
CO2: 21 mmol/L — ABNORMAL LOW (ref 22–32)
Calcium: 8.6 mg/dL — ABNORMAL LOW (ref 8.9–10.3)
Chloride: 105 mmol/L (ref 98–111)
Creatinine, Ser: 0.48 mg/dL (ref 0.44–1.00)
GFR, Estimated: >60 mL/min (ref >60)
Glucose, Bld: 102 mg/dL — ABNORMAL HIGH (ref 70–99)
Potassium: 3.9 mmol/L (ref 3.5–5.1)
Sodium: 136 mmol/L (ref 135–145)
Total Bilirubin: 0.5 mg/dL (ref 0.3–1.2)
Total Protein: 5.4 g/dL — ABNORMAL LOW (ref 6.5–8.1)

## 2022-11-28 LAB — CK
Total CK: 16498 U/L — ABNORMAL HIGH (ref 38–234)
Total CK: 18761 U/L — ABNORMAL HIGH (ref 38–234)

## 2022-11-28 LAB — LACTATE DEHYDROGENASE: LDH: 469 U/L — ABNORMAL HIGH (ref 98–192)

## 2022-11-28 NOTE — Progress Notes (Signed)
                 Interval history No leg soreness.  Continues to feel well.  Pleased about decreasing CK  Physical exam Blood pressure (!) 143/84, pulse 94, temperature 99.3 F (37.4 C), temperature source Oral, resp. rate 18, SpO2 100 %.  Well appearing, ambulating in room Breathing regular and unlabored Skin warm and dry Mood is positive, optimistic  Weight change:    Intake/Output Summary (Last 24 hours) at 11/28/2022 4259 Last data filed at 11/27/2022 1628 Gross per 24 hour  Intake 3030 ml  Output --  Net 3030 ml    Labs Creatinine stable AST/ALT decreasing CK approximately 16,000, decreasing  Assessment and plan Hospital day 6  Kayla Adkins is a 41 y.o.  with history of myositis who presents after laboratory monitoring revealed CK elevation to 69,714 and elevated creatinine in setting of recent influenza A infection, admitted for treatment of rhabdomyolysis with liver enzyme elevation secondary to viral myositis.   Principal Problem:   rhabdomyolysis Active Problems:   Hypertension   Viral myositis   Influenza A  Viral myositis Rhabdomyolysis with elevated liver enzymes In the setting of influenza A infection.  Patient remains asymptomatic.  CK down to approximately 16,000.  Will get a repeat level this afternoon.  Discharge for less than 10,000. - Continue LR IV at 300 mL/h - Continue monitoring I's/O - Daily CK, LDH - Trend liver enzymes   Hypertension Home regimen includes amlodipine-olmesartan.  ARB has been held preservation of renal function in setting of rhabdo. - Continue amlodipine 5 mg daily   Diet: Regular IVF: LR at 300 mL/h VTE: Rivaroxaban 10 mg daily Code: Full code   Discharge plan: Pending treatment of rhabdomyolysis due to viral myositis   Marrianne Mood MD 11/28/2022, 6:48 AM  Pager: [phone] After 5pm on weekdays and 1pm on weekends: [phone]

## 2022-11-28 NOTE — Discharge Instructions (Addendum)
You were hospitalized for rhabdomyolysis due to influenza myositis, which is when the flu virus infects the muscles and causes them to break down. You were treated with IV fluids to prevent injury to your kidneys from muscle breakdown products like creatine kinase (CK). CK levels have improved. Please follow up with our clinic to repeat lab work next week. Thank you for allowing [place] to be part of your care.   -Continue to stay hydrated. Please try to drink about 1 glass of water/fluid every 1-2 hours.  -We will contact the [facility] to schedule you either an office visit or lab-only visit next week.  -No new medications at time of discharge.   Please call our clinic if you have any questions or concerns, we may be able to help and keep you from a long and expensive emergency room wait. Our clinic and after hours phone number is [phone], the best time to call is Monday through Friday 9 am to 4 pm but there is always someone available 24/7 if you have an emergency. If you need medication refills please notify your pharmacy one week in advance and they will send [place] a request.

## 2022-11-29 LAB — COMPREHENSIVE METABOLIC PANEL
ALT: 117 U/L — ABNORMAL HIGH (ref 0–44)
AST: 190 U/L — ABNORMAL HIGH (ref 15–41)
Albumin: 2.8 g/dL — ABNORMAL LOW (ref 3.5–5.0)
Alkaline Phosphatase: 43 U/L (ref 38–126)
Anion gap: 5 (ref 5–15)
BUN: 6 mg/dL (ref 6–20)
CO2: 26 mmol/L (ref 22–32)
Calcium: 8.7 mg/dL — ABNORMAL LOW (ref 8.9–10.3)
Chloride: 104 mmol/L (ref 98–111)
Creatinine, Ser: 0.57 mg/dL (ref 0.44–1.00)
GFR, Estimated: >60 mL/min (ref >60)
Glucose, Bld: 101 mg/dL — ABNORMAL HIGH (ref 70–99)
Potassium: 3.3 mmol/L — ABNORMAL LOW (ref 3.5–5.1)
Sodium: 135 mmol/L (ref 135–145)
Total Bilirubin: 0.2 mg/dL — ABNORMAL LOW (ref 0.3–1.2)
Total Protein: 5.6 g/dL — ABNORMAL LOW (ref 6.5–8.1)

## 2022-11-29 LAB — CK
Total CK: 16140 U/L — ABNORMAL HIGH (ref 38–234)
Total CK: 13376 U/L — ABNORMAL HIGH (ref 38–234)

## 2022-11-29 LAB — MAGNESIUM: Magnesium: 1.8 mg/dL (ref 1.7–2.4)

## 2022-11-29 LAB — LACTATE DEHYDROGENASE: LDH: 320 U/L — ABNORMAL HIGH (ref 98–192)

## 2022-11-29 MED ORDER — POTASSIUM CHLORIDE CRYS ER 20 MEQ PO TBCR
40.0000 meq | EXTENDED_RELEASE_TABLET | Freq: Two times a day (BID) | ORAL | Status: AC
  Administered 2022-11-29 (×2): 40 meq via ORAL
  Filled 2022-11-29 (×2): qty 2

## 2022-11-29 MED ORDER — LACTATED RINGERS IV SOLN: INTRAVENOUS | Status: DC

## 2022-11-29 NOTE — Progress Notes (Signed)
HD#7 Subjective:   Summary: Kayla Adkins is a 41 y.o. female with PMH of myositis who presents after laboratory monitoring revealed CK elevation to [phone] and elevated creatinine in setting of recent influenza A infection, admitted for rhabdomyolysis with liver enzyme elevation secondary to viral myositis.   Overnight Events: none  Patient reports feeling well. No new concerns. No calf soreness. Has good appetite and fluid intake.   Objective:  Vital signs in last 24 hours: Vitals:   11/28/22 1625 11/28/22 2035 11/29/22 0506 11/29/22 0808  BP: 138/81 133/79 131/88 (!) 149/87  Pulse: 93 92 95 (!) 106  Resp: 19 18 18 16   Temp: 98.5 F (36.9 C) 98.3 F (36.8 C) 98.2 F (36.8 C) 98.8 F (37.1 C)  TempSrc: Oral Oral Oral Oral  SpO2: 100% 100% 100% 100%    Physical Exam:  Constitutional: well-appearing female, laying in bed comfortably, in no acute distress Cardiovascular: regular rate and rhythm  Pulmonary/Chest: normal work of breathing on RA, anterior lung sounds clear to auscultation Neurological: alert & oriented  Skin: warm and dry Psych: pleasant mood  No intake or output data in the 24 hours ending 11/29/22 1008 Net IO Since Admission: 59,856.99 mL [11/29/22 1008]  Pertinent Labs:    Latest Ref Rng & Units 11/25/2022    8:48 AM 11/24/2022    9:14 AM 11/23/2022    6:18 AM  CBC  WBC 4.0 - 10.5 K/uL 8.2  9.2  10.4   Hemoglobin 12.0 - 15.0 g/dL 11/25/2022  83.3  9.8   Hematocrit 36.0 - 46.0 % 35.6  32.5  29.5   Platelets 150 - 400 K/uL 324  282  258        Latest Ref Rng & Units 11/29/2022    2:57 AM 11/28/2022    4:09 AM 11/27/2022    3:50 AM  CMP  Glucose 70 - 99 mg/dL 11/29/2022  053  976   BUN 6 - 20 mg/dL 6  6  8    Creatinine 0.44 - 1.00 mg/dL 734   1.93   Sodium 135 - 145 mmol/L 135  136  136   Potassium 3.5 - 5.1 mmol/L 3.3  3.9  3.5   Chloride 98 - 111 mmol/L 104  105  103   CO2 22 - 32 mmol/L 26  21  25    Calcium 8.9 - 10.3 mg/dL 8.7  8.6  8.8    Total Protein 6.5 - 8.1 g/dL 5.6  5.4  5.7   Total Bilirubin 0.3 - 1.2 mg/dL 0.2  0.5  0.3   Alkaline Phos 38 - 126 U/L 43  36  43   AST 15 - 41 U/L 190  217  289   ALT 0 - 44 U/L 117  121  142     Imaging: No results found.  Assessment/Plan:   Principal Problem:   rhabdomyolysis Active Problems:   Hypertension   Patient Summary: Kayla Adkins is a 41 y.o. with a pertinent PMH of myositis who presents after laboratory monitoring revealed CK elevation to 69,714 and elevated creatinine in setting of recent influenza A infection, admitted for rhabdomyolysis with liver enzyme elevation secondary to viral myositis.   Viral myositis Rhabdomyolysis with elevated liver enzymes In setting of influenza A infection. CK remains at 16,000 today. Asymptomatic. Will continue IV fluids and trending labs.  -LR 300 ml/hr  -trend CK and LDH -trend liver enzymes    HTN Has been normotensive on home amlodipine dose.  -  continue amlodipine   Hypokalemia Potassium 3.3 today. Mag 1.8. Will replete today.  -potassium 80 mEq today    Diet: Normal IVF: LR, 300 ml/hr VTE:  Xarelto  Code: Full   Dispo: Anticipated discharge pending treatment of rhabdomyolysis due to viral myositis  Rana Snare, DO Internal Medicine Resident PGY-1 Pager: [phone] Please contact the on call pager after 5 pm and on weekends at [phone].

## 2022-11-29 NOTE — Progress Notes (Signed)
CSW spoke with patient per RN request. Patient had general questions about financial resources in the community. CSW advised patient to reach out to [facility] to determine what resources are available.   Edwin Dada, MSW, LCSW Transitions of Care  Clinical Social Worker II [phone]

## 2022-11-30 DIAGNOSIS — M6282 Rhabdomyolysis: Secondary | ICD-10-CM | POA: Diagnosis not present

## 2022-11-30 LAB — CK: Total CK: 9220 U/L — ABNORMAL HIGH (ref 38–234)

## 2022-11-30 LAB — COMPREHENSIVE METABOLIC PANEL
ALT: 99 U/L — ABNORMAL HIGH (ref 0–44)
AST: 135 U/L — ABNORMAL HIGH (ref 15–41)
Albumin: 2.7 g/dL — ABNORMAL LOW (ref 3.5–5.0)
Alkaline Phosphatase: 42 U/L (ref 38–126)
Anion gap: 7 (ref 5–15)
BUN: 8 mg/dL (ref 6–20)
CO2: 24 mmol/L (ref 22–32)
Calcium: 8.6 mg/dL — ABNORMAL LOW (ref 8.9–10.3)
Chloride: 106 mmol/L (ref 98–111)
Creatinine, Ser: 0.57 mg/dL (ref 0.44–1.00)
GFR, Estimated: >60 mL/min (ref >60)
Glucose, Bld: 113 mg/dL — ABNORMAL HIGH (ref 70–99)
Potassium: 3.7 mmol/L (ref 3.5–5.1)
Sodium: 137 mmol/L (ref 135–145)
Total Bilirubin: 0.5 mg/dL (ref 0.3–1.2)
Total Protein: 5.5 g/dL — ABNORMAL LOW (ref 6.5–8.1)

## 2022-11-30 LAB — LACTATE DEHYDROGENASE: LDH: 248 U/L — ABNORMAL HIGH (ref 98–192)

## 2022-11-30 NOTE — Plan of Care (Signed)
Problem: Education: Goal: Knowledge of General Education information will improve Description: Including pain rating scale, medication(s)/side effects and non-pharmacologic comfort measures 11/30/2022 1457 by Doyce Para, RN Outcome: Adequate for Discharge 11/30/2022 1131 by Doyce Para, RN Outcome: Progressing   Problem: Health Behavior/Discharge Planning: Goal: Ability to manage health-related needs will improve 11/30/2022 1457 by Doyce Para, RN Outcome: Adequate for Discharge 11/30/2022 1131 by Doyce Para, RN Outcome: Progressing   Problem: Clinical Measurements: Goal: Ability to maintain clinical measurements within normal limits will improve 11/30/2022 1457 by Doyce Para, RN Outcome: Adequate for Discharge 11/30/2022 1131 by Doyce Para, RN Outcome: Progressing Goal: Will remain free from infection 11/30/2022 1457 by Doyce Para, RN Outcome: Adequate for Discharge 11/30/2022 1131 by Doyce Para, RN Outcome: Progressing Goal: Diagnostic test results will improve 11/30/2022 1457 by Doyce Para, RN Outcome: Adequate for Discharge 11/30/2022 1131 by Doyce Para, RN Outcome: Progressing Goal: Respiratory complications will improve 11/30/2022 1457 by Doyce Para, RN Outcome: Adequate for Discharge 11/30/2022 1131 by Doyce Para, RN Outcome: Progressing Goal: Cardiovascular complication will be avoided 11/30/2022 1457 by Doyce Para, RN Outcome: Adequate for Discharge 11/30/2022 1131 by Doyce Para, RN Outcome: Progressing   Problem: Activity: Goal: Risk for activity intolerance will decrease 11/30/2022 1457 by Doyce Para, RN Outcome: Adequate for Discharge 11/30/2022 1131 by Doyce Para, RN Outcome: Progressing   Problem: Nutrition: Goal: Adequate nutrition will be maintained 11/30/2022 1457 by Doyce Para, RN Outcome:  Adequate for Discharge 11/30/2022 1131 by Doyce Para, RN Outcome: Progressing   Problem: Coping: Goal: Level of anxiety will decrease 11/30/2022 1457 by Doyce Para, RN Outcome: Adequate for Discharge 11/30/2022 1131 by Doyce Para, RN Outcome: Progressing   Problem: Elimination: Goal: Will not experience complications related to bowel motility 11/30/2022 1457 by Doyce Para, RN Outcome: Adequate for Discharge 11/30/2022 1131 by Doyce Para, RN Outcome: Progressing Goal: Will not experience complications related to urinary retention 11/30/2022 1457 by Doyce Para, RN Outcome: Adequate for Discharge 11/30/2022 1131 by Doyce Para, RN Outcome: Progressing   Problem: Pain Managment: Goal: General experience of comfort will improve 11/30/2022 1457 by Doyce Para, RN Outcome: Adequate for Discharge 11/30/2022 1131 by Doyce Para, RN Outcome: Progressing   Problem: Safety: Goal: Ability to remain free from injury will improve 11/30/2022 1457 by Doyce Para, RN Outcome: Adequate for Discharge 11/30/2022 1131 by Doyce Para, RN Outcome: Progressing   Problem: Skin Integrity: Goal: Risk for impaired skin integrity will decrease 11/30/2022 1457 by Doyce Para, RN Outcome: Adequate for Discharge 11/30/2022 1131 by Doyce Para, RN Outcome: Progressing   Problem: Education: Goal: Knowledge of General Education information will improve Description: Including pain rating scale, medication(s)/side effects and non-pharmacologic comfort measures 11/30/2022 1457 by Doyce Para, RN Outcome: Adequate for Discharge 11/30/2022 1131 by Doyce Para, RN Outcome: Progressing   Problem: Health Behavior/Discharge Planning: Goal: Ability to manage health-related needs will improve 11/30/2022 1457 by Doyce Para, RN Outcome: Adequate for Discharge 11/30/2022 1131  by Doyce Para, RN Outcome: Progressing   Problem: Clinical Measurements: Goal: Ability to maintain clinical measurements within normal limits will improve 11/30/2022 1457 by Doyce Para, RN Outcome: Adequate for Discharge 11/30/2022 1131 by Doyce Para, RN Outcome: Progressing Goal: Will remain free from infection 11/30/2022 1457 by Doyce Para, RN Outcome: Adequate for Discharge 11/30/2022 1131 by Doyce Para, RN Outcome: Progressing Goal: Diagnostic test results will improve 11/30/2022 1457 by Doyce Para, RN Outcome: Adequate for Discharge 11/30/2022 1131 by Doyce Para, RN Outcome: Progressing Goal: Respiratory complications will improve 11/30/2022 1457 by  Doyce Para, RN Outcome: Adequate for Discharge 11/30/2022 1131 by Doyce Para, RN Outcome: Progressing Goal: Cardiovascular complication will be avoided 11/30/2022 1457 by Doyce Para, RN Outcome: Adequate for Discharge 11/30/2022 1131 by Doyce Para, RN Outcome: Progressing   Problem: Activity: Goal: Risk for activity intolerance will decrease 11/30/2022 1457 by Doyce Para, RN Outcome: Adequate for Discharge 11/30/2022 1131 by Doyce Para, RN Outcome: Progressing   Problem: Nutrition: Goal: Adequate nutrition will be maintained 11/30/2022 1457 by Doyce Para, RN Outcome: Adequate for Discharge 11/30/2022 1131 by Doyce Para, RN Outcome: Progressing   Problem: Coping: Goal: Level of anxiety will decrease 11/30/2022 1457 by Doyce Para, RN Outcome: Adequate for Discharge 11/30/2022 1131 by Doyce Para, RN Outcome: Progressing   Problem: Elimination: Goal: Will not experience complications related to bowel motility 11/30/2022 1457 by Doyce Para, RN Outcome: Adequate for Discharge 11/30/2022 1131 by Doyce Para, RN Outcome: Progressing Goal: Will not  experience complications related to urinary retention 11/30/2022 1457 by Doyce Para, RN Outcome: Adequate for Discharge 11/30/2022 1131 by Doyce Para, RN Outcome: Progressing   Problem: Pain Managment: Goal: General experience of comfort will improve 11/30/2022 1457 by Doyce Para, RN Outcome: Adequate for Discharge 11/30/2022 1131 by Doyce Para, RN Outcome: Progressing   Problem: Safety: Goal: Ability to remain free from injury will improve 11/30/2022 1457 by Doyce Para, RN Outcome: Adequate for Discharge 11/30/2022 1131 by Doyce Para, RN Outcome: Progressing   Problem: Skin Integrity: Goal: Risk for impaired skin integrity will decrease 11/30/2022 1457 by Doyce Para, RN Outcome: Adequate for Discharge 11/30/2022 1131 by Doyce Para, RN Outcome: Progressing

## 2022-11-30 NOTE — Discharge Summary (Signed)
Name: Kayla Adkins MRN: [id number] DOB: Sep 13, 1981 41 y.o. PCP: Champ Mungo, DO  Date of Admission: 11/21/2022  1:00 PM Date of Discharge: 11/30/22 Attending Physician: Dr.  Lafonda Mosses  Discharge Diagnosis: Principal Problem:   rhabdomyolysis Active Problems:   Hypertension    Discharge Medications: Allergies as of 11/30/2022   No Known Allergies      Medication List     STOP taking these medications    dextromethorphan 30 MG/5ML liquid Commonly known as: Robitussin 12 Hour Cough   ibuprofen 600 MG tablet Commonly known as: ADVIL   potassium chloride 10 MEQ tablet Commonly known as: KLOR-CON       TAKE these medications    acetaminophen 500 MG tablet Commonly known as: TYLENOL Take 1 tablet (500 mg total) by mouth every 6 (six) hours as needed for mild pain.   amLODipine-olmesartan 5-20 MG tablet Commonly known as: AZOR Take 1 tablet by mouth daily.   meclizine 25 MG tablet Commonly known as: ANTIVERT Take 1 tablet (25 mg total) by mouth 3 (three) times daily as needed for dizziness.        Disposition and follow-up:   [name] was discharged from [facility] in Good condition.  At the hospital follow up visit please address:  1.  Follow-up:  a. Rhabdomyolysis 2/2 influenza myositis: repeat CK and CMP within 1 week after discharge at [facility]    b. HTN: restart home amlodipine-olmesartan, reassess BP  2.  Labs / imaging needed at time of follow-up: CK, CMP  3.  Pending labs/ test needing follow-up: none  4.  Medication Changes: none  Follow-up Appointments:  Follow-up Information     Champ Mungo, DO Follow up in 1 week(s).   Specialty: Internal Medicine Why: We will reach out to the clinic to either schedule office visit with Dr. Montez Morita or just a lab-only visit in 1 week. Contact information: [address] [place] [address] [phone]                 Hospital Course by problem  list: Ahley Adkins is a 41 y.o. female with PMH of myositis who presents after laboratory monitoring revealed CK elevation to 69,714 and elevated creatinine in setting of recent influenza A infection, admitted for rhabdomyolysis with liver enzyme elevation secondary to viral myositis.    Rhabdomyolysis Viral myositis Influenza A Patient was evaluated in outpatient setting after course of uncomplicated flu.  Because she has a history of myositis complicated by rhabdomyolysis, she requested CK levels to be obtained.  They were elevated at approximately 70,000.  She was directed to go to the emergency department.  She was admitted for treatment of rhabdomyolysis due to viral myositis and prevention of pigment induced AKI.  She was treated with copious IV fluids  without any signs of volume overload.  Her creatinine remained within normal limits and stable for the duration of her hospitalization.  She was discharged in good condition with CK of 9220. Plan for f/u in 1 week at [facility] either office visit if available otherwise lab-only visit to repeat CMP and CK.   HTN Mostly normotensive but at times slightly elevated over hospital course. Will continue home amlodipine-olmesartan.   Hypokalemia Presented with asymptomatic hypokalemia which was treated with potassium supplementation. Potassium 3.7 at time of discharge.     Discharge Subjective: Patient feeling well and denies any new symptoms or concerns.   Discharge Exam:   BP (!) 147/96 (BP Location: Left Arm)  Pulse 98   Temp 98.9 F (37.2 C) (Oral)   Resp 16   SpO2 100%  Constitutional: well-appearing female, sitting in bed comfortably, in no acute distress HENT: normocephalic atraumatic Cardiovascular: regular rate Pulmonary/Chest: normal work of breathing on room air, lungs clear to auscultation bilaterally MSK: normal bulk and tone Neurological: alert & oriented  Skin: warm and dry Psych: pleasant mood   Pertinent Labs, Studies,  and Procedures:     Latest Ref Rng & Units 11/25/2022    8:48 AM 11/24/2022    9:14 AM 11/23/2022    6:18 AM  CBC  WBC 4.0 - 10.5 K/uL 8.2  9.2  10.4   Hemoglobin 12.0 - 15.0 g/dL 16.0  10.9  9.8   Hematocrit 36.0 - 46.0 % 35.6  32.5  29.5   Platelets 150 - 400 K/uL 324  282  258        Latest Ref Rng & Units 11/30/2022    3:44 AM 11/29/2022    2:57 AM 11/28/2022    4:09 AM  CMP  Glucose 70 - 99 mg/dL 323  557  322   BUN 6 - 20 mg/dL 8  6  6    Creatinine 0.44 - 1.00 mg/dL  0.25  4.27   Sodium 135 - 145 mmol/L 137  135  136   Potassium 3.5 - 5.1 mmol/L 3.7  3.3  3.9   Chloride 98 - 111 mmol/L 106  104  105   CO2 22 - 32 mmol/L 24  26  21    Calcium 8.9 - 10.3 mg/dL 8.6  8.7  8.6   Total Protein 6.5 - 8.1 g/dL 5.5  5.6  5.4   Total Bilirubin 0.3 - 1.2 mg/dL 0.5  0.2  0.5   Alkaline Phos 38 - 126 U/L 42  43  36   AST 15 - 41 U/L 135  190  217   ALT 0 - 44 U/L 99  117  121     No results found.   Discharge Instructions: Discharge Instructions     Call MD for:  difficulty breathing, headache or visual disturbances   Complete by: As directed    Call MD for:  extreme fatigue   Complete by: As directed    Call MD for:  hives   Complete by: As directed    Call MD for:  persistant dizziness or light-headedness   Complete by: As directed    Call MD for:  persistant nausea and vomiting   Complete by: As directed    Call MD for:  severe uncontrolled pain   Complete by: As directed    Call MD for:  temperature >100.4   Complete by: As directed    Diet - low sodium heart healthy   Complete by: As directed    Increase activity slowly   Complete by: As directed        Signed: 0.62, DO 11/30/2022, 2:20 PM   Pager: [phone]

## 2022-11-30 NOTE — Plan of Care (Signed)

## 2022-12-04 ENCOUNTER — Encounter: Payer: Self-pay | Admitting: Internal Medicine

## 2022-12-04 ENCOUNTER — Ambulatory Visit (INDEPENDENT_AMBULATORY_CARE_PROVIDER_SITE_OTHER): Payer: Medicaid Other | Admitting: Internal Medicine

## 2022-12-04 VITALS — BP 121/75 | HR 85 | Temp 98.0°F | Resp 24 | Ht 66.0 in | Wt 196.0 lb

## 2022-12-04 DIAGNOSIS — I10 Essential (primary) hypertension: Secondary | ICD-10-CM | POA: Diagnosis not present

## 2022-12-04 DIAGNOSIS — Z7984 Long term (current) use of oral hypoglycemic drugs: Secondary | ICD-10-CM

## 2022-12-04 DIAGNOSIS — Z23 Encounter for immunization: Secondary | ICD-10-CM | POA: Diagnosis not present

## 2022-12-04 DIAGNOSIS — M6282 Rhabdomyolysis: Secondary | ICD-10-CM | POA: Diagnosis not present

## 2022-12-04 DIAGNOSIS — R7303 Prediabetes: Secondary | ICD-10-CM | POA: Diagnosis not present

## 2022-12-04 DIAGNOSIS — Z0000 Encounter for general adult medical examination without abnormal findings: Secondary | ICD-10-CM

## 2022-12-04 DIAGNOSIS — Z87891 Personal history of nicotine dependence: Secondary | ICD-10-CM

## 2022-12-04 DIAGNOSIS — R739 Hyperglycemia, unspecified: Secondary | ICD-10-CM | POA: Diagnosis not present

## 2022-12-04 DIAGNOSIS — M609 Myositis, unspecified: Secondary | ICD-10-CM

## 2022-12-04 NOTE — Patient Instructions (Addendum)
Kayla Adkins, it was a pleasure seeing you today! You endorsed feeling well today. Below are some of the things we talked about this visit. We look forward to seeing you in the follow up appointment!  Today we discussed: Continue taking amlodipine 5 mg daily. You do not need to start taking the combination pill currently since your blood pressure is well controlled. You can stop taking the metoprolol as well.  I have ordered some lab work and ordered a mammogram for you.  You will get a flu shot today. You can make an appointment for next week for a pap smear and blood pressure follow up.   I have ordered the following labs today:  Lab Orders         CMP14 + Anion Gap         CK, total         Hemoglobin A1c       Referrals ordered today:   Referral Orders  No referral(s) requested today     I have ordered the following medication/changed the following medications:   Stop the following medications: There are no discontinued medications.   Start the following medications: No orders of the defined types were placed in this encounter.    Follow-up: Next week with Dr. Humphrey Rolls   Please make sure to arrive 15 minutes prior to your next appointment. If you arrive late, you may be asked to reschedule.   We look forward to seeing you next time. Please call our clinic at [phone] if you have any questions or concerns. The best time to call is Monday-Friday from 9am-4pm, but there is someone available 24/7. If after hours or the weekend, call the main hospital number and ask for the Internal Medicine Resident On-Call. If you need medication refills, please notify your pharmacy one week in advance and they will send [place] a request.  Thank you for letting us take part in your care. Wishing you the best!  Thank you, Idamae Schuller, MD

## 2022-12-04 NOTE — Progress Notes (Unsigned)
CC: Hospital Follow up  HPI:  [name] is a 42 y.o. with medical history of HTN, and recent hx of viral myositis presenting to [facility] for a hospital follow up. Pt was hospitalized for rhabdomyolysis 2/2 viral infection from 12/20-12/29. Here for a hospital follow up. PCP is Dr. Marlou Sa and pt was last seen by Dr. Posey Pronto on 11/20/22 for flu like symptoms. CK at OP visit was 36 k prompting her admission.   Please see problem-based list for further details, assessments, and plans.  Past Medical History:  Diagnosis Date   Hypertension    Rhabdomyolysis 07/16/2018   Vertigo    Viral myositis 10/03/2018    Current Outpatient Medications (Endocrine & Metabolic):    metFORMIN (GLUCOPHAGE-XR) 500 MG 24 hr tablet, Take 1 tablet (500 mg total) by mouth daily with breakfast for 7 days, THEN 1 tablet (500 mg total) 2 (two) times daily with a meal for 7 days, THEN 3 tablets (1,500 mg total) daily with breakfast for 7 days, THEN 2 tablets (1,000 mg total) 2 (two) times daily with a meal for 7 days. For the first week: take 1 tablet once a day. For the second week: take 1 tablet in the AM and 1 tablet at the evening. For the third week: take 2 tablets (total of 1000 mg) in the AM and 1 tablet at the evening. For the forth week: take 2 tablets (total of 1000 mg) in the AM and 2 tablets (total of 1000 mg) at the evening..  Current Outpatient Medications (Cardiovascular):    amLODipine-olmesartan (AZOR) 5-20 MG tablet, Take 1 tablet by mouth daily.   Current Outpatient Medications (Analgesics):    acetaminophen (TYLENOL) 500 MG tablet, Take 1 tablet (500 mg total) by mouth every 6 (six) hours as needed for mild pain.   Current Outpatient Medications (Other):    meclizine (ANTIVERT) 25 MG tablet, Take 1 tablet (25 mg total) by mouth 3 (three) times daily as needed for dizziness.  Review of Systems:  Review of system negative unless stated in the problem list or HPI.    Physical Exam:  Vitals:    12/04/22 1609  BP: 121/75  Pulse: 85  Resp: (!) 24  Temp: 98 F (36.7 C)  TempSrc: Oral  SpO2: 100%  Weight: 196 lb (88.9 kg)  Height: 5\' 6"  (1.676 m)    Physical Exam General: NAD HENT: NCAT Lungs: CTAB, no wheeze, rhonchi or rales.  Cardiovascular: Normal heart sounds, no r/m/g, 2+ pulses in all extremities. No LE edema Abdomen: No TTP, normal bowel sounds MSK: No asymmetry or muscle atrophy.  Skin: no lesions noted on exposed skin Neuro: Alert and oriented x4. CN grossly intact Psych: Normal mood and normal affect   Assessment & Plan:   Hypertension Pt currently on amlodipine 5 mg and Metoprolol 25 mg BID. No current indication for pt to be on metoprolol. Will have pt discontinue metoprolol and continue amlodipine. Since pt was normotensive in the clinic, advised her to hold off on starting the combination pill for now and continue amlodipine only.  -Continue amlodipine only. Check BP at follow up visit.   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 This was her second admission. Muscles pain resolved 3rd day after discharge. Pt states she can tell if she has rhabdo as her urine turns red and pt gets muscle pains during myositis flare. First flare was 4 years ago. Patient was previously on prednisone during her last  flare but this flare she received aggressive IVF. Her CK was around 70k on admission and decreased to 9k. She was seen by rheumatology but appears lost to follow up. CK this visit was 2.2, CMP shows normal renal fxn and resolving ALT and AST levels. Will refer pt to rheumatology to complete her workup. Interestingly, patient had hypokalemia prior to her admission both times with K being 2.9 and 2.7 respectively for her 2019 and 2023 admissions. This raises the question for hypokalemic induced rhabdomyolysis as it is a common finding during both admission.   Prediabetes Pt has prediabetes with A1c at 6.1. Advised  lifestyle changes and pt states she would like to try Metformin to improve her A1c and prevent progression to diabetes.  -Metformin sent with instructions to increase to 1000 mg BID.  -Follow up A1c in 3 months.   Health care maintenance Flu shot given this office visit. Patient plans to return next week for pap smear.    See Encounters Tab for problem based charting.  Patient discussed with Dr. Roderic Ovens, MD Tillie Rung. [facility] Internal Medicine Residency, PGY-2

## 2022-12-05 ENCOUNTER — Telehealth: Payer: Self-pay

## 2022-12-05 DIAGNOSIS — R7303 Prediabetes: Secondary | ICD-10-CM | POA: Insufficient documentation

## 2022-12-05 LAB — CMP14 + ANION GAP
ALT: 61 IU/L — ABNORMAL HIGH (ref 0–32)
AST: 51 IU/L — ABNORMAL HIGH (ref 0–40)
Albumin/Globulin Ratio: 1.4 (ref 1.2–2.2)
Albumin: 3.9 g/dL (ref 3.9–4.9)
Alkaline Phosphatase: 74 IU/L (ref 44–121)
Anion Gap: 15 mmol/L (ref 10.0–18.0)
BUN/Creatinine Ratio: 14 (ref 9–23)
BUN: 9 mg/dL (ref 6–24)
Bilirubin Total: <0.2 mg/dL (ref 0.0–1.2)
CO2: 24 mmol/L (ref 20–29)
Calcium: 9.4 mg/dL (ref 8.7–10.2)
Chloride: 103 mmol/L (ref 96–106)
Creatinine, Ser: 0.64 mg/dL (ref 0.57–1.00)
Globulin, Total: 2.7 g/dL (ref 1.5–4.5)
Glucose: 87 mg/dL (ref 70–99)
Potassium: 3.7 mmol/L (ref 3.5–5.2)
Sodium: 142 mmol/L (ref 134–144)
Total Protein: 6.6 g/dL (ref 6.0–8.5)
eGFR: 113 mL/min/{1.73_m2} (ref >59)

## 2022-12-05 LAB — HEMOGLOBIN A1C
Est. average glucose Bld gHb Est-mCnc: 128 mg/dL
Hgb A1c MFr Bld: 6.1 % — ABNORMAL HIGH (ref 4.8–5.6)

## 2022-12-05 LAB — CK: Total CK: 2238 U/L (ref 32–182)

## 2022-12-05 MED ORDER — METFORMIN HCL ER 500 MG PO TB24: ORAL_TABLET | ORAL | 0 refills | Status: DC

## 2022-12-05 NOTE — Assessment & Plan Note (Signed)
Flu shot given this office visit. Patient plans to return next week for pap smear.

## 2022-12-05 NOTE — Assessment & Plan Note (Addendum)
Pt has prediabetes with A1c at 6.1. Advised lifestyle changes and pt states she would like to try Metformin to improve her A1c and prevent progression to diabetes.  -Metformin sent with instructions to increase to 1000 mg BID.  -Follow up A1c in 3 months.

## 2022-12-05 NOTE — Telephone Encounter (Signed)
Requesting lab results, please call pt back.  

## 2022-12-05 NOTE — Assessment & Plan Note (Signed)
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 This was her second admission. Muscles pain resolved 3rd day after discharge. Pt states she can tell if she has rhabdo as her urine turns red and pt gets muscle pains during myositis flare. First flare was 4 years ago. Patient was previously on prednisone during her last flare but this flare she received aggressive IVF. Her CK was around 70k on admission and decreased to 9k. She was seen by rheumatology but appears lost to follow up. CK this visit was 2.2, CMP shows normal renal fxn and resolving ALT and AST levels. Will refer pt to rheumatology to complete her workup. Interestingly, patient had hypokalemia prior to her admission both times with K being 2.9 and 2.7 respectively for her 2019 and 2023 admissions. This raises the question for hypokalemic induced rhabdomyolysis as it is a common finding during both admission.

## 2022-12-05 NOTE — Assessment & Plan Note (Signed)
Pt currently on amlodipine 5 mg and Metoprolol 25 mg BID. No current indication for pt to be on metoprolol. Will have pt discontinue metoprolol and continue amlodipine. Since pt was normotensive in the clinic, advised her to hold off on starting the combination pill for now and continue amlodipine only.  -Continue amlodipine only. Check BP at follow up visit.

## 2022-12-06 NOTE — Progress Notes (Signed)
Internal Medicine Clinic Attending  Case discussed with the resident at the time of the visit.  We reviewed the resident's history and exam and pertinent patient test results.  I agree with the assessment, diagnosis, and plan of care documented in the resident's note.  

## 2022-12-12 ENCOUNTER — Other Ambulatory Visit: Payer: Self-pay | Admitting: Internal Medicine

## 2022-12-12 DIAGNOSIS — M609 Myositis, unspecified: Secondary | ICD-10-CM

## 2022-12-21 NOTE — Progress Notes (Deleted)
CC: 1 week f/u visit  HPI:  Kayla Adkins is a 42 y.o. 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    Allergies as of 12/21/2022   No Known Allergies      Medication List        Accurate as of December 21, 2022  7:43 AM. If you have any questions, ask your nurse or doctor.          acetaminophen 500 MG tablet Commonly known as: TYLENOL Take 1 tablet (500 mg total) by mouth every 6 (six) hours as needed for mild pain.   amLODipine-olmesartan 5-20 MG tablet Commonly known as: AZOR Take 1 tablet by mouth daily.   meclizine 25 MG tablet Commonly known as: ANTIVERT Take 1 tablet (25 mg total) by mouth 3 (three) times daily as needed for dizziness.   metFORMIN 500 MG 24 hr tablet Commonly known as: GLUCOPHAGE-XR Take 1 tablet (500 mg total) by mouth daily with breakfast for 7 days, THEN 1 tablet (500 mg total) 2 (two) times daily with a meal for 7 days, THEN 3 tablets (1,500 mg total) daily with breakfast for 7 days, THEN 2 tablets (1,000 mg total) 2 (two) times daily with a meal for 7 days. For the first week: take 1 tablet once a day. For the second week: take 1 tablet in the AM and 1 tablet at the evening. For the third week: take 2 tablets (total of 1000 mg) in the AM and 1 tablet at the evening. For the forth week: take 2 tablets (total of 1000 mg) in the AM and 2 tablets (total of 1000 mg) at the evening.. Start taking on: December 05, 2022         Past Medical History:  Diagnosis Date   Hypertension    Rhabdomyolysis 07/16/2018   Vertigo    Viral myositis 10/03/2018   Review of Systems:  per HPI.   Physical Exam: *** There were no vitals filed for this visit.  *** Constitutional: Well-developed, well-nourished, appears comfortable  HENT: Normocephalic and atraumatic.  Eyes: EOM are normal. PERRL.  Neck: Normal range of motion.  Cardiovascular:  Regular rate, regular rhythm. No murmurs, rubs, or gallops. Normal radial and PT pulses bilaterally. No LE edema.  Pulmonary: Normal respiratory effort. No wheezes, rales, or rhonchi.   Abdominal: Soft. Non-distended. No tenderness. Normal bowel sounds.  Musculoskeletal: Normal range of motion.     Neurological: Alert and oriented to person, place, and time. Non-focal. Skin: warm and dry.    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 CK was up to 29,000 during her hospitalization. Repeat CK following hospitalization trended downward at 2,200. CMP demonstrated mild elevation of AST/ALT. Muscle pain today***. Drinking plenty of fluids***.   Plan: - Repeat CK today?***  2. HTN Current medications include amLODipine-olmesartan 5-20 mg daily. Patient states that he is *** compliant with this medication. Patient states that he does *** check his BP regularly at home. Patient denies HA, lightheadedness, dizziness, CP, or SOB. Initial BP today is ***. Repeat BP is ***.    Plan: - Continue amLODipine-olmesartan 5-20 mg daily  3. - Current medications include  Plan: -  4. Health Screening: - Tdap (), pap smear (), mammogram () -  Medication refill?     See Encounters Tab for problem based charting.  Patient seen with Dr. Barbaraann Boys

## 2022-12-26 ENCOUNTER — Ambulatory Visit: Payer: Medicaid Other

## 2022-12-26 VITALS — BP 111/70 | HR 94 | Wt 199.4 lb

## 2022-12-26 DIAGNOSIS — Z87891 Personal history of nicotine dependence: Secondary | ICD-10-CM

## 2022-12-26 DIAGNOSIS — M6282 Rhabdomyolysis: Secondary | ICD-10-CM

## 2022-12-26 DIAGNOSIS — Z23 Encounter for immunization: Secondary | ICD-10-CM | POA: Diagnosis not present

## 2022-12-26 DIAGNOSIS — I10 Essential (primary) hypertension: Secondary | ICD-10-CM

## 2022-12-26 DIAGNOSIS — Z0000 Encounter for general adult medical examination without abnormal findings: Secondary | ICD-10-CM

## 2022-12-26 DIAGNOSIS — R7303 Prediabetes: Secondary | ICD-10-CM | POA: Diagnosis not present

## 2022-12-26 DIAGNOSIS — G4700 Insomnia, unspecified: Secondary | ICD-10-CM

## 2022-12-26 NOTE — Patient Instructions (Addendum)
Thank you for coming to see [place] in clinic  Plan: - Please start taking metformin 500 mg with the following schedule:  1000 mg (2 tablets) with breakfast and 500 mg (1 tablet) with dinner for 7 days (week 1)  Followed by:   1000 mg (2 tablets) with breakfast and 1000 mg (2 tablets) with dinner from there on (week 2 and after)   - Please try over the counter melatonin for your difficulty falling asleep  - We drew your blood today to ensure your CK is trending downward, remember to keep hydrated  - We gave you the tetanus booster today  - Please follow up at the time and date below for your mammogram.  02/01/2023 3:20 PM [facility] [place]    It was very nice to see you, thank you for allowing [place] to be involved in your care.

## 2022-12-26 NOTE — Progress Notes (Signed)
CC: routine f/u visit  HPI:  [name] is a 42 y.o. female with past medical history of HTN, pre-DM, neuropathy, and vertigo that presents for routine f/u visit.   Patient has a history of HTN. Current medications include amLODipine-olmesartan 5-20 mg daily. Patient states that she is compliant with this medication. Patient states that she does check her BP regularly at home and notes systolic values in the 518A-416S. Patient denies HA, lightheadedness, dizziness, CP, or SOB.   Patient also has a history of Pre-DM. Current medications include metformin 1000 mg BID. Patient was started on this medication in early 12/2022 to lower her A1c. Patient states that she is compliant with this medication. Patient endorses occasional polydipsia, polyuria, and fatigue which is not bothersome to her daily life.   Patient reports history of difficulty falling asleep. She reports improvement with melatonin in the past. She would like to restart this medication.   Patient was discharged on 12/29 after being hospitalized for Rhabdomyloysis 2/2 influenza. Patient reports a fall down 12 stairs prior to this hospitalization. She attributes this fall to her history of vertigo. She denies other trauma or prolonged periods of immobilization. Patient has been hydrating appropriately since then. Patient denies muscle cramps. Denies vertigo or falls since her hospitalization.   Patient is comfortable receiving Tdap today. She is already scheduled to have a mammogram done. She would like to hold off on pap smear until a later date.   Allergies as of 12/26/2022   No Known Allergies      Medication List        Accurate as of December 26, 2022  3:45 PM. If you have any questions, ask your nurse or doctor.          acetaminophen 500 MG tablet Commonly known as: TYLENOL Take 1 tablet (500 mg total) by mouth every 6 (six) hours as needed for mild pain.   amLODipine-olmesartan 5-20 MG tablet Commonly  known as: AZOR Take 1 tablet by mouth daily.   meclizine 25 MG tablet Commonly known as: ANTIVERT Take 1 tablet (25 mg total) by mouth 3 (three) times daily as needed for dizziness.   metFORMIN 500 MG 24 hr tablet Commonly known as: GLUCOPHAGE-XR Take 1 tablet (500 mg total) by mouth daily with breakfast for 7 days, THEN 1 tablet (500 mg total) 2 (two) times daily with a meal for 7 days, THEN 3 tablets (1,500 mg total) daily with breakfast for 7 days, THEN 2 tablets (1,000 mg total) 2 (two) times daily with a meal for 7 days. For the first week: take 1 tablet once a day. For the second week: take 1 tablet in the AM and 1 tablet at the evening. For the third week: take 2 tablets (total of 1000 mg) in the AM and 1 tablet at the evening. For the forth week: take 2 tablets (total of 1000 mg) in the AM and 2 tablets (total of 1000 mg) at the evening.. Start taking on: December 05, 2022         Past Medical History:  Diagnosis Date   Hypertension    Rhabdomyolysis 07/16/2018   Vertigo    Viral myositis 10/03/2018   Review of Systems:  per HPI.   Physical Exam: Vitals:   12/26/22 1452  BP: 111/70  Pulse: 94  SpO2: 100%  Weight: 199 lb 6.4 oz (90.4 kg)   Constitutional: Well-developed, well-nourished, appears comfortable  HENT: Normocephalic and atraumatic.  Eyes: EOM are normal. PERRL.  Neck: Normal range of motion.  Cardiovascular: Regular rate, regular rhythm. No murmurs, rubs, or gallops. Normal radial and PT pulses bilaterally. No LE edema.  Pulmonary: Normal respiratory effort. No wheezes, rales, or rhonchi. No crackles.  Abdominal: Soft. Non-distended. No tenderness. Normal bowel sounds.  Musculoskeletal: Normal range of motion.     Neurological: Alert and oriented to person, place, and time. Non-focal. Skin: warm and dry.    Assessment & Plan:   See Encounters Tab for problem based charting.  Patient seen with Dr. Saverio Danker

## 2022-12-26 NOTE — Assessment & Plan Note (Signed)
Current medications include metformin 1000 mg BID. Patient was started on this medication in early 12/2022 to lower her A1c. Patient states that she is compliant with this medication. Patient endorses occasional polydipsia, polyuria, and fatigue. A1c was 6.1% three weeks ago.  Plan: - Continue metformin 1000 mg BID - Repeat A1c in 2 months

## 2022-12-26 NOTE — Assessment & Plan Note (Signed)
Administered Tdap today. Patient already scheduled for mammogram. Patient would like to hold off on pap smear until a later date.

## 2022-12-26 NOTE — Assessment & Plan Note (Signed)
Patient was discharged on 12/29 after being hospitalized for Rhabdomyloysis 2/2 influenza. Patient reports a fall down 12 stairs prior to this hospitalization. She attributes this fall to her history of vertigo. She denies other trauma or prolonged periods of immobilization. Patient has been hydrating appropriately since then. Patient denies muscle cramps. Denies vertigo or falls since her hospitalization. CK was 2,200 three weeks ago. Creatinine WNL 3 weeks ago.  Plan: - Repeat CK today

## 2022-12-26 NOTE — Assessment & Plan Note (Signed)
Patient reports history of difficulty falling asleep. She reports improvement with melatonin in the past. She would like to restart this medication.   Plan: - OTC melatonin

## 2022-12-26 NOTE — Assessment & Plan Note (Signed)
Current medications include amLODipine-olmesartan 5-20 mg daily. Patient states that she is compliant with this medication. Patient states that she does check her BP regularly at home and notes systolic values in the 997F-414E. Patient denies HA, lightheadedness, dizziness, CP, or SOB. Initial BP today is 111/70.   Plan: - Continue amLODipine-olmesartan 5-20 mg daily

## 2022-12-27 LAB — CK: Total CK: 97 U/L (ref 32–182)

## 2022-12-27 NOTE — Progress Notes (Signed)
Internal Medicine Clinic Attending  Case discussed with Dr. Mapp  At the time of the visit.  We reviewed the resident's history and exam and pertinent patient test results.  I agree with the assessment, diagnosis, and plan of care documented in the resident's note.  

## 2023-01-18 DIAGNOSIS — H5213 Myopia, bilateral: Secondary | ICD-10-CM | POA: Diagnosis not present

## 2023-02-01 ENCOUNTER — Ambulatory Visit: Payer: Medicaid Other

## 2023-02-04 DIAGNOSIS — H5213 Myopia, bilateral: Secondary | ICD-10-CM | POA: Diagnosis not present

## 2023-03-19 ENCOUNTER — Other Ambulatory Visit: Payer: Self-pay | Admitting: Internal Medicine

## 2023-03-19 DIAGNOSIS — Z0000 Encounter for general adult medical examination without abnormal findings: Secondary | ICD-10-CM

## 2023-03-19 DIAGNOSIS — Z23 Encounter for immunization: Secondary | ICD-10-CM

## 2023-03-19 DIAGNOSIS — R739 Hyperglycemia, unspecified: Secondary | ICD-10-CM

## 2023-03-19 DIAGNOSIS — M6282 Rhabdomyolysis: Secondary | ICD-10-CM

## 2023-03-19 DIAGNOSIS — M609 Myositis, unspecified: Secondary | ICD-10-CM

## 2023-03-19 DIAGNOSIS — R7303 Prediabetes: Secondary | ICD-10-CM

## 2023-03-19 DIAGNOSIS — Z1231 Encounter for screening mammogram for malignant neoplasm of breast: Secondary | ICD-10-CM

## 2023-03-19 DIAGNOSIS — I10 Essential (primary) hypertension: Secondary | ICD-10-CM

## 2023-03-21 ENCOUNTER — Ambulatory Visit: Payer: Medicaid Other

## 2023-03-21 ENCOUNTER — Ambulatory Visit
Admission: RE | Admit: 2023-03-21 | Discharge: 2023-03-21 | Disposition: A | Discharge status: Home / Self Care | Payer: Medicaid Other | Source: Ambulatory Visit | Attending: Internal Medicine | Admitting: Internal Medicine

## 2023-03-21 DIAGNOSIS — Z1231 Encounter for screening mammogram for malignant neoplasm of breast: Secondary | ICD-10-CM

## 2023-05-16 ENCOUNTER — Other Ambulatory Visit: Payer: Self-pay | Admitting: *Deleted

## 2023-05-16 NOTE — Telephone Encounter (Signed)
Call from requesting a refill on her med for vertigo; states when she stands up, she's dizzy. And needs a refill on her BP med. Thanks

## 2023-05-17 MED ORDER — MECLIZINE HCL 25 MG PO TABS: 25.0000 mg | ORAL_TABLET | Freq: Three times a day (TID) | ORAL | 0 refills | Status: DC | PRN

## 2023-05-17 MED ORDER — AMLODIPINE-OLMESARTAN 5-20 MG PO TABS: 1.0000 | ORAL_TABLET | Freq: Every day | ORAL | 0 refills | Status: DC

## 2023-07-19 ENCOUNTER — Encounter: Payer: Medicaid Other | Admitting: Internal Medicine

## 2023-07-19 NOTE — Progress Notes (Deleted)
HTN BP today ***. OP regimen is amlodipine-olmesartan 5-20 mg daily which she is*** compliant with. Last BMP ***. Plan:  Neuropathy Rhabdo  Prediabetes Previously prescribed metformin 1000 mg daily which she is not*** compliant with. Last HbA1c 12/2022 was 6.1%. Plan:  vertigo

## 2023-08-09 ENCOUNTER — Encounter: Payer: Medicaid Other | Admitting: Student

## 2023-08-12 ENCOUNTER — Encounter: Payer: Self-pay | Admitting: Internal Medicine

## 2023-08-12 ENCOUNTER — Ambulatory Visit (INDEPENDENT_AMBULATORY_CARE_PROVIDER_SITE_OTHER): Payer: Medicaid Other | Admitting: Internal Medicine

## 2023-08-12 VITALS — BP 179/91 | HR 67 | Ht 63.0 in | Wt 206.3 lb

## 2023-08-12 DIAGNOSIS — Z0000 Encounter for general adult medical examination without abnormal findings: Secondary | ICD-10-CM

## 2023-08-12 DIAGNOSIS — I10 Essential (primary) hypertension: Secondary | ICD-10-CM | POA: Diagnosis not present

## 2023-08-12 DIAGNOSIS — Z23 Encounter for immunization: Secondary | ICD-10-CM | POA: Diagnosis not present

## 2023-08-12 DIAGNOSIS — R7303 Prediabetes: Secondary | ICD-10-CM | POA: Diagnosis not present

## 2023-08-12 DIAGNOSIS — M6282 Rhabdomyolysis: Secondary | ICD-10-CM

## 2023-08-12 LAB — GLUCOSE, CAPILLARY: Glucose-Capillary: 110 mg/dL — ABNORMAL HIGH (ref 70–99)

## 2023-08-12 LAB — POCT GLYCOSYLATED HEMOGLOBIN (HGB A1C): Hemoglobin A1C: 5.7 % — AB (ref 4.0–5.6)

## 2023-08-12 MED ORDER — AMLODIPINE-OLMESARTAN 5-20 MG PO TABS: 1.0000 | ORAL_TABLET | Freq: Every day | ORAL | 2 refills | Status: DC

## 2023-08-12 NOTE — Assessment & Plan Note (Signed)
Patient ran out of antihypertensive several weeks ago and has not been taking anything for blood pressure. BP today was 180/103, recheck was 179/91.  -Restart amlodipine-olmesartan 5-20

## 2023-08-12 NOTE — Progress Notes (Signed)
    Subjective:  CC: routine follow up  HPI:  [name] is a 42 y.o. female with a past medical history stated below and presents today for above. Please see problem based assessment and plan for additional details.  Past Medical History:  Diagnosis Date   Hypertension    Rhabdomyolysis 07/16/2018   Vertigo    Viral myositis 10/03/2018    Current Outpatient Medications on File Prior to Visit  Medication Sig Dispense Refill   acetaminophen (TYLENOL) 500 MG tablet Take 1 tablet (500 mg total) by mouth every 6 (six) hours as needed for mild pain. 30 tablet 0   meclizine (ANTIVERT) 25 MG tablet Take 1 tablet (25 mg total) by mouth 3 (three) times daily as needed for dizziness. 30 tablet 0   No current facility-administered medications on file prior to visit.    Review of Systems: ROS negative except for as is noted on the assessment and plan.  Objective:   Vitals:   08/12/23 1326 08/12/23 1348  BP: (!) 180/103 (!) 179/91  Pulse: 70 67  SpO2: 100%   Weight: 206 lb 4.8 oz (93.6 kg)   Height: 5\' 3"  (1.6 m)     Physical Exam: Constitutional: well-appearing, in no acute distress HENT: normocephalic atraumatic, mucous membranes moist Eyes: conjunctiva non-erythematous Neck: supple Cardiovascular: regular rate and rhythm, no m/r/g Pulmonary/Chest: normal work of breathing on room air, lungs clear to auscultation bilaterally Abdominal: soft, non-tender, non-distended MSK: normal bulk and tone Neurological: alert & oriented x 3, 5/5 strength in bilateral upper and lower extremities, normal gait Skin: warm and dry  Assessment & Plan:   Hypertension Patient ran out of antihypertensive several weeks ago and has not been taking anything for blood pressure. BP today was 180/103, recheck was 179/91.  -Restart amlodipine-olmesartan 5-20  rhabdomyolysis Patient has history of non-traumatic rhabdomyolysis. Has been feeling some leg pain and fatigue, similar to previous  episodes of rhabdo.  -Check CBC, BMP, CK today -Encourage hydration  Healthcare maintenance -Follow up appointment in 6 months, Pap smear at that time -Influenza vaccine today -Lipid panel today  Prediabetes A1c today 5.7, down from 6.1 six months ago. Patient was on four week course of metformin in January, not currently on any medications for diabetes -Encourage exercise and low-carb diet    Patient seen with Dr. Bari Edward MD [facility]  PGY-1 Pager: [phone] Date 08/12/2023  Time 2:04 PM

## 2023-08-12 NOTE — Assessment & Plan Note (Signed)
-  Follow up appointment in 6 months, Pap smear at that time -Influenza vaccine today -Lipid panel today

## 2023-08-12 NOTE — Assessment & Plan Note (Signed)
Patient has history of non-traumatic rhabdomyolysis. Has been feeling some leg pain and fatigue, similar to previous episodes of rhabdo.  -Check CBC, BMP, CK today -Encourage hydration

## 2023-08-12 NOTE — Patient Instructions (Signed)
Ms Hebel,   It was a pleasure meeting you today.  To summarize our visit:  For your blood pressure I will be restarting the amlodipine-olmesartan pill that you have been on previously.  Your A1c level was 5.7 today, which is lower than the last time it was checked.  Well done keeping your blood sugar at an appropriate level.   We will do some blood work today, and I will call you if the results are out of the ordinary.  They will also be available on MyChart.  We can plan on seeing you again in approximately 6 months, a Pap smear will be done at that time.  Monna Fam, MD

## 2023-08-12 NOTE — Assessment & Plan Note (Addendum)
A1c today 5.7, down from 6.1 six months ago. Patient was on four week course of metformin in January, not currently on any medications for diabetes -Encourage exercise and low-carb diet

## 2023-08-13 LAB — CBC
Hematocrit: 29.9 % — ABNORMAL LOW (ref 34.0–46.6)
Hemoglobin: 8.8 g/dL — ABNORMAL LOW (ref 11.1–15.9)
MCH: 24.7 pg — ABNORMAL LOW (ref 26.6–33.0)
MCHC: 29.4 g/dL — ABNORMAL LOW (ref 31.5–35.7)
MCV: 84 fL (ref 79–97)
Platelets: 367 10*3/uL (ref 150–450)
RBC: 3.56 x10E6/uL — ABNORMAL LOW (ref 3.77–5.28)
RDW: 15.5 % — ABNORMAL HIGH (ref 11.7–15.4)
WBC: 5.2 10*3/uL (ref 3.4–10.8)

## 2023-08-13 LAB — BMP8+ANION GAP
Anion Gap: 13 mmol/L (ref 10.0–18.0)
BUN/Creatinine Ratio: 13 (ref 9–23)
BUN: 9 mg/dL (ref 6–24)
CO2: 24 mmol/L (ref 20–29)
Calcium: 9.1 mg/dL (ref 8.7–10.2)
Chloride: 105 mmol/L (ref 96–106)
Creatinine, Ser: 0.72 mg/dL (ref 0.57–1.00)
Glucose: 84 mg/dL (ref 70–99)
Potassium: 3.7 mmol/L (ref 3.5–5.2)
Sodium: 142 mmol/L (ref 134–144)
eGFR: 107 mL/min/{1.73_m2} (ref >59)

## 2023-08-13 LAB — LIPID PANEL
Chol/HDL Ratio: 3.4 ratio (ref 0.0–4.4)
Cholesterol, Total: 145 mg/dL (ref 100–199)
HDL: 43 mg/dL (ref >39)
LDL Chol Calc (NIH): 85 mg/dL (ref 0–99)
Triglycerides: 92 mg/dL (ref 0–149)
VLDL Cholesterol Cal: 17 mg/dL (ref 5–40)

## 2023-08-13 LAB — CK: Total CK: 146 U/L (ref 32–182)

## 2023-08-14 ENCOUNTER — Other Ambulatory Visit: Payer: Self-pay | Admitting: Internal Medicine

## 2023-08-14 ENCOUNTER — Other Ambulatory Visit: Payer: Medicaid Other

## 2023-08-14 DIAGNOSIS — D649 Anemia, unspecified: Secondary | ICD-10-CM

## 2023-08-14 NOTE — Progress Notes (Signed)
Spoke with patient over the phone about her recent labs showing hemoglobin 8.8. Patient has had some fatigue but no other symptoms. Discussed scheduling a lab only clinic visit for an iron panel. Patient was amenable to plan.

## 2023-08-15 ENCOUNTER — Other Ambulatory Visit: Payer: Self-pay | Admitting: Internal Medicine

## 2023-08-15 LAB — IRON,TIBC AND FERRITIN PANEL
Ferritin: 8 ng/mL — ABNORMAL LOW (ref 15–150)
Iron Saturation: 5 % — CL (ref 15–55)
Iron: 18 ug/dL — ABNORMAL LOW (ref 27–159)
Total Iron Binding Capacity: 342 ug/dL (ref 250–450)
UIBC: 324 ug/dL (ref 131–425)

## 2023-08-15 MED ORDER — FERROUS SULFATE 325 (65 FE) MG PO TABS: 325.0000 mg | ORAL_TABLET | Freq: Every day | ORAL | 3 refills | Status: DC

## 2023-08-15 NOTE — Progress Notes (Signed)
Called pt to discuss results of iron panel. Iron was 18, Ferritin 8, iron saturation 5. Ordered ferrous sulfate 325mg  to be taken MWF with three month follow up appointment to recheck labs. Patient amenable to plan.

## 2023-08-24 NOTE — Progress Notes (Signed)
Internal Medicine Clinic Attending  I was physically present during the key portions of the resident provided service and participated in the medical decision making of patient's management care. I reviewed pertinent patient test results.  The assessment, diagnosis, and plan were formulated together and I agree with the documentation in the resident's note.  CBC resulted with significant drop in H/H.  Iron is also low.  Noted trial of iron therapy, which she will start.  Will also ask Dr. Carlynn Purl to assess for signs/symptoms of bleeding and need for further work up for blood loss.   Inez Catalina, MD

## 2023-08-27 ENCOUNTER — Telehealth: Payer: Self-pay

## 2023-08-27 ENCOUNTER — Ambulatory Visit (INDEPENDENT_AMBULATORY_CARE_PROVIDER_SITE_OTHER): Payer: Medicaid Other | Admitting: Student

## 2023-08-27 VITALS — BP 139/83 | HR 70 | Wt 203.7 lb

## 2023-08-27 DIAGNOSIS — D500 Iron deficiency anemia secondary to blood loss (chronic): Secondary | ICD-10-CM

## 2023-08-27 DIAGNOSIS — N939 Abnormal uterine and vaginal bleeding, unspecified: Secondary | ICD-10-CM | POA: Diagnosis not present

## 2023-08-27 NOTE — Assessment & Plan Note (Signed)
Patient presented today to pick up a work note but due to recent labs showing a hemoglobin drop from 11-8.8 she was put in room to discuss her symptoms.  Patient has had regular periods every 4 weeks since being a teenager.  Bleeding last about 4 to 5 days she uses 9-12 pads total and on day 1 uses about 3-4 pads.  She has not had any changes in her menstrual cycles recently.  Her CBC was drawn on the third day of her cycle 2 weeks ago.  She has not bled since then.  She has had fatigue for about the past 6 months and lab work 2 weeks ago also showed significant iron deficiency.  Her only other significant bleeding history is when she was about 42 years old she had a massive hemorrhage that she does not remember the cause but was given blood transfusions and prescribed iron for some period of time.  Since then she has not been on iron replacement.  Most likely she is having chronic blood loss due to her menstrual periods as she does not have any GI changes or symptoms concerning for GI bleed/PUD. - Referral to GYN, continue iron replacement, CBC today - If worsening anemia despite no bleeding since her last visit we will consider further lab workup of normocytic anemia as well as GI referral as she is approaching the age for colonoscopy

## 2023-08-27 NOTE — Telephone Encounter (Signed)
Patient walked in asking for note for her job. Needs note stating that she has no restrictions.  Needs to be addressed to Mr. Onalee Hua. Patient is waiting at the desk for the letter.

## 2023-08-27 NOTE — Telephone Encounter (Signed)
Patient called she is requesting a work note stating she can return with no restrictions, please return patients call.

## 2023-08-27 NOTE — Assessment & Plan Note (Signed)
Patient has started iron replacement about 1 week ago.  Please see abnormal uterine bleeding for further details on today's visit.

## 2023-08-27 NOTE — Progress Notes (Signed)
   CC: Anemia  HPI:  Kayla Adkins is a 42 y.o. female with PMH as below who presents to the clinic for follow-up on anemia.  Please see assessment and plan for further details.  Past Medical History:  Diagnosis Date   Hypertension    Rhabdomyolysis 07/16/2018   Vertigo    Viral myositis 10/03/2018   Review of Systems:   Pertinent items noted in HPI and/or A&P.  Physical Exam:  Vitals:   08/27/23 1140  BP: 139/83  Pulse: 70  SpO2: 99%  Weight: 203 lb 11.2 oz (92.4 kg)    Constitutional: Well-appearing adult female. In no acute distress. Cardio:Regular rate and rhythm. 2+ bilateral radial pulses. Pulm:Clear to auscultation bilaterally. Normal work of breathing on room air. Abdomen: Soft, non-tender, non-distended, positive bowel sounds. GHW:EXHBZJIR for extremity edema. Skin:Warm and dry. Neuro:Alert and oriented x3. No focal deficit noted. Psych:Pleasant mood and affect.   Assessment & Plan:   Iron deficiency anemia due to chronic blood loss Patient has started iron replacement about 1 week ago.  Please see abnormal uterine bleeding for further details on today's visit.  Abnormal uterine bleeding Patient presented today to pick up a work note but due to recent labs showing a hemoglobin drop from 11-8.8 she was put in room to discuss her symptoms.  Patient has had regular periods every 4 weeks since being a teenager.  Bleeding last about 4 to 5 days she uses 9-12 pads total and on day 1 uses about 3-4 pads.  She has not had any changes in her menstrual cycles recently.  Her CBC was drawn on the third day of her cycle 2 weeks ago.  She has not bled since then.  She has had fatigue for about the past 6 months and lab work 2 weeks ago also showed significant iron deficiency.  Her only other significant bleeding history is when she was about 42 years old she had a massive hemorrhage that she does not remember the cause but was given blood transfusions and prescribed iron for  some period of time.  Since then she has not been on iron replacement.  Most likely she is having chronic blood loss due to her menstrual periods as she does not have any GI changes or symptoms concerning for GI bleed/PUD. - Referral to GYN, continue iron replacement, CBC today - If worsening anemia despite no bleeding since her last visit we will consider further lab workup of normocytic anemia as well as GI referral as she is approaching the age for colonoscopy    Patient discussed with Dr. Quinn Plowman, DO [facility] Resident PGY-2 Pager: [phone]

## 2023-08-28 LAB — CBC WITH DIFFERENTIAL/PLATELET
Basophils Absolute: 0 10*3/uL (ref 0.0–0.2)
Basos: 0 %
EOS (ABSOLUTE): 0.1 10*3/uL (ref 0.0–0.4)
Eos: 1 %
Hematocrit: 29.6 % — ABNORMAL LOW (ref 34.0–46.6)
Hemoglobin: 8.9 g/dL — ABNORMAL LOW (ref 11.1–15.9)
Immature Grans (Abs): 0 10*3/uL (ref 0.0–0.1)
Immature Granulocytes: 0 %
Lymphocytes Absolute: 2.1 10*3/uL (ref 0.7–3.1)
Lymphs: 47 %
MCH: 25.3 pg — ABNORMAL LOW (ref 26.6–33.0)
MCHC: 30.1 g/dL — ABNORMAL LOW (ref 31.5–35.7)
MCV: 84 fL (ref 79–97)
Monocytes Absolute: 0.3 10*3/uL (ref 0.1–0.9)
Monocytes: 6 %
Neutrophils Absolute: 2 10*3/uL (ref 1.4–7.0)
Neutrophils: 46 %
Platelets: 329 10*3/uL (ref 150–450)
RBC: 3.52 x10E6/uL — ABNORMAL LOW (ref 3.77–5.28)
RDW: 16.8 % — ABNORMAL HIGH (ref 11.7–15.4)
WBC: 4.4 10*3/uL (ref 3.4–10.8)

## 2023-08-28 NOTE — Progress Notes (Signed)
Internal Medicine Clinic Attending  Case discussed with the resident at the time of the visit.  We reviewed the resident's history and exam and pertinent patient test results.  I agree with the assessment, diagnosis, and plan of care documented in the resident's note.  

## 2023-08-29 NOTE — Progress Notes (Signed)
Called and spoke to patient about stable hemoglobin at 8.9.  Referral to OB/GYN is still pending.  No changes in management at this time.

## 2023-09-02 ENCOUNTER — Encounter: Payer: Medicaid Other | Admitting: Student

## 2023-09-12 ENCOUNTER — Encounter: Payer: Medicaid Other | Admitting: Student

## 2024-01-01 ENCOUNTER — Encounter: Payer: Medicaid Other | Admitting: Student

## 2024-01-02 ENCOUNTER — Encounter: Payer: Medicaid Other | Admitting: Student

## 2024-01-02 NOTE — Progress Notes (Deleted)
   Established Patient Office Visit  Subjective   Patient ID: Kayla Adkins, female    DOB: 05/08/1981  Age: 43 y.o. MRN: [id number]  No chief complaint on file.   Kayla Adkins is a 43 y.o. who presents to the clinic for ***. Please see problem based assessment and plan for additional details.   {History (Optional):23778}  ROS    Objective:     There were no vitals taken for this visit. {Vitals History (Optional):23777}  Physical Exam   No results found for any visits on 01/02/24.  {Labs (Optional):23779}  The 10-year ASCVD risk score (Arnett DK, et al., 2019) is: 3.6%    Assessment & Plan:   Problem List Items Addressed This Visit   None  HTN:  Prediabetes:  IDA: AUB:  No follow-ups on file.    Faith Rogue, DO

## 2024-01-29 ENCOUNTER — Ambulatory Visit: Payer: Medicaid Other | Admitting: Student

## 2024-01-29 VITALS — BP 140/82 | HR 89 | Temp 98.4°F | Ht 63.0 in | Wt 205.6 lb

## 2024-01-29 DIAGNOSIS — D500 Iron deficiency anemia secondary to blood loss (chronic): Secondary | ICD-10-CM | POA: Diagnosis not present

## 2024-01-29 DIAGNOSIS — I10 Essential (primary) hypertension: Secondary | ICD-10-CM

## 2024-01-29 DIAGNOSIS — M6282 Rhabdomyolysis: Secondary | ICD-10-CM | POA: Diagnosis not present

## 2024-01-29 MED ORDER — FERROUS SULFATE 325 (65 FE) MG PO TABS: 325.0000 mg | ORAL_TABLET | Freq: Every day | ORAL | 0 refills | Status: DC

## 2024-01-29 MED ORDER — AMLODIPINE-OLMESARTAN 5-20 MG PO TABS: 1.0000 | ORAL_TABLET | Freq: Every day | ORAL | 2 refills | Status: DC

## 2024-01-29 NOTE — Assessment & Plan Note (Signed)
 Has a history of abnormal uterine bleeding. Denies any bleeding at this time. Denies any BRBPR or tarry stools. She stopped taking iron. Tsat was 5, Ferritin was 8. Not currently taking iron, took about a week of iron pills. Leg pain may be related to IDA. Reports easy fatigue and heavyness as if she is "carrying her whole family in her legs". Worsens with ambulation.  However pulses are somewhat thready, given age unlikely that this is related to PAD, pt does not smoke either. However, would consider ABIs if these symptoms do not resolve after treating her anemia.   -Start iron supplementation  -Check CBC tomorrow will reflex to iron panel if anemia present.

## 2024-01-29 NOTE — Patient Instructions (Addendum)
 Thank you, [name] for allowing [place] to provide your care today.  I have ordered the following labs for you:  Lab Orders         CK, total         CBC with Diff     I have ordered the following medication/changed the following medications:   Start the following medications: Meds ordered this encounter  Medications   amLODipine-olmesartan (AZOR) 5-20 MG tablet    Sig: Take 1 tablet by mouth daily.    Dispense:  90 tablet    Refill:  2   ferrous sulfate 325 (65 FE) MG tablet    Sig: Take 1 tablet (325 mg total) by mouth daily.    Dispense:  90 tablet    Refill:  0     Follow up: 1 month   Remember:   Take your iron with Orange juice or lemonade, or high citrus food.   Should you have any questions or concerns please call the internal medicine clinic at [phone].     Manuela Neptune, MD [facility]

## 2024-01-29 NOTE — Assessment & Plan Note (Signed)
 Has history of non-traumatic rhabdomyolysis. Has leg pain and worried this may be related to her Rhabdo. No dark urine, no CVA tenderness, no other muscle pain. Recently checked in September and WNL. Patient is very concerned about this, will check a CK.   -CK

## 2024-01-29 NOTE — Progress Notes (Signed)
 CC:  Chief Complaint  Patient presents with   Medication Refill   HPI:  Kayla Adkins is a 43 y.o. female living with a history stated below and presents today for medication refill and concern about her lower leg pain. Please see problem based assessment and plan for additional details.  Past Medical History:  Diagnosis Date   Hypertension    Rhabdomyolysis 07/16/2018   Vertigo    Viral myositis 10/03/2018    Current Outpatient Medications on File Prior to Visit  Medication Sig Dispense Refill   acetaminophen (TYLENOL) 500 MG tablet Take 1 tablet (500 mg total) by mouth every 6 (six) hours as needed for mild pain. 30 tablet 0   meclizine (ANTIVERT) 25 MG tablet Take 1 tablet (25 mg total) by mouth 3 (three) times daily as needed for dizziness. 30 tablet 0   No current facility-administered medications on file prior to visit.    No family history on file.  Social History   Socioeconomic History   Marital status: Married    Spouse name: Not on file   Number of children: Not on file   Years of education: Not on file   Highest education level: Not on file  Occupational History   Not on file  Tobacco Use   Smoking status: Former    Current packs/day: 0.00    Average packs/day: 0.3 packs/day for 2.0 years (0.5 ttl pk-yrs)    Types: Cigarettes    Start date: 08/06/2011    Quit date: 08/05/2013    Years since quitting: 10.4   Smokeless tobacco: Never  Substance and Sexual Activity   Alcohol use: Yes    Comment: sometimes   Drug use: No   Sexual activity: Not on file  Other Topics Concern   Not on file  Social History Narrative   Not on file   Social Drivers of Health   Financial Resource Strain: Not on file  Food Insecurity: No Food Insecurity (11/21/2022)   Hunger Vital Sign    Worried About Running Out of Food in the Last Year: Never true    Ran Out of Food in the Last Year: Never true  Transportation Needs: No Transportation Needs (11/21/2022)    PRAPARE - Administrator, Civil Service (Medical): No    Lack of Transportation (Non-Medical): No  Physical Activity: Not on file  Stress: Not on file  Social Connections: Moderately Integrated (11/20/2022)   Social Connection and Isolation Panel [NHANES]    Frequency of Communication with Friends and Family: More than three times a week    Frequency of Social Gatherings with Friends and Family: More than three times a week    Attends Religious Services: More than 4 times per year    Active Member of [employer] or Organizations: No    Attends Banker Meetings: Never    Marital Status: Married  Catering manager Violence: Not At Risk (11/21/2022)   Humiliation, Afraid, Rape, and Kick questionnaire    Fear of Current or Ex-Partner: No    Emotionally Abused: No    Physically Abused: No    Sexually Abused: No    Review of Systems: ROS negative except for what is noted on the assessment and plan.  Vitals:   01/29/24 1549 01/29/24 1617  BP: (!) 142/83 (!) 140/82  Pulse: 97 89  Temp: 98.4 F (36.9 C)   TempSrc: Oral   SpO2: 100%   Weight: 205 lb 9.6 oz (93.3 kg)  Height: 5\' 3"  (1.6 m)     Physical Exam: Constitutional: well-appearing, in NAD HENT: normocephalic atraumatic, mucous membranes moist Eyes: conjunctiva non-erythematous Cardiovascular: regular rate and rhythm, no m/r/g Pulmonary/Chest: normal work of breathing on room air, lungs clear to auscultation bilaterally Abdominal: soft, non-tender, non-distended MSK: normal bulk and tone Neurological: alert & oriented x 3, no focal deficit Skin: warm and dry Psych: normal mood and behavior  Assessment & Plan:   Patient discussed with Dr. Cleda Daub  rhabdomyolysis Has history of non-traumatic rhabdomyolysis. Has leg pain and worried this may be related to her Rhabdo. No dark urine, no CVA tenderness, no other muscle pain. Recently checked in September and WNL. Patient is very concerned about this,  will check a CK.   -CK  Iron deficiency anemia due to chronic blood loss Has a history of abnormal uterine bleeding. Denies any bleeding at this time. Denies any BRBPR or tarry stools. She stopped taking iron. Tsat was 5, Ferritin was 8. Not currently taking iron, took about a week of iron pills. Leg pain may be related to IDA. Reports easy fatigue and heavyness as if she is "carrying her whole family in her legs". Worsens with ambulation.  However pulses are somewhat thready, given age unlikely that this is related to PAD, pt does not smoke either. However, would consider ABIs if these symptoms do not resolve after treating her anemia.   -Start iron supplementation  -Check CBC tomorrow will reflex to iron panel if anemia present.   Manuela Neptune, MD [facility] Internal Medicine, PGY-1 Phone: [phone] Date 01/29/2024 Time 6:07 PM

## 2024-01-30 ENCOUNTER — Other Ambulatory Visit (INDEPENDENT_AMBULATORY_CARE_PROVIDER_SITE_OTHER): Payer: Medicaid Other

## 2024-01-30 DIAGNOSIS — D500 Iron deficiency anemia secondary to blood loss (chronic): Secondary | ICD-10-CM

## 2024-01-30 DIAGNOSIS — M6282 Rhabdomyolysis: Secondary | ICD-10-CM | POA: Diagnosis not present

## 2024-01-30 NOTE — Addendum Note (Signed)
 Addended by: Cala Bradford on: 01/30/2024 08:17 AM   Modules accepted: Orders

## 2024-01-31 ENCOUNTER — Encounter: Payer: Self-pay | Admitting: Student

## 2024-01-31 LAB — CBC WITH DIFFERENTIAL/PLATELET

## 2024-02-01 LAB — CBC WITH DIFFERENTIAL/PLATELET
Basophils Absolute: 0 10*3/uL (ref 0.0–0.2)
Basos: 0 %
EOS (ABSOLUTE): 0.2 10*3/uL (ref 0.0–0.4)
Eos: 3 %
Hematocrit: 30.1 % — ABNORMAL LOW (ref 34.0–46.6)
Hemoglobin: 9 g/dL — ABNORMAL LOW (ref 11.1–15.9)
Immature Grans (Abs): 0 10*3/uL (ref 0.0–0.1)
Immature Granulocytes: 0 %
Lymphocytes Absolute: 2.5 10*3/uL (ref 0.7–3.1)
Lymphs: 46 %
MCH: 25.2 pg — ABNORMAL LOW (ref 26.6–33.0)
MCHC: 29.9 g/dL — ABNORMAL LOW (ref 31.5–35.7)
MCV: 84 fL (ref 79–97)
Monocytes Absolute: 0.3 10*3/uL (ref 0.1–0.9)
Monocytes: 5 %
Neutrophils Absolute: 2.5 10*3/uL (ref 1.4–7.0)
Neutrophils: 46 %
Platelets: 373 10*3/uL (ref 150–450)
RBC: 3.57 x10E6/uL — ABNORMAL LOW (ref 3.77–5.28)
RDW: 16.6 % — ABNORMAL HIGH (ref 11.7–15.4)
WBC: 5.4 10*3/uL (ref 3.4–10.8)

## 2024-02-01 LAB — CK: Total CK: 140 U/L (ref 32–182)

## 2024-02-02 NOTE — Progress Notes (Signed)
 Internal Medicine Clinic Attending  Case discussed with the resident at the time of the visit.  We reviewed the resident's history and exam and pertinent patient test results.  I agree with the assessment, diagnosis, and plan of care documented in the resident's note.

## 2024-04-16 ENCOUNTER — Emergency Department (HOSPITAL_COMMUNITY)
Admission: EM | Admit: 2024-04-16 | Discharge: 2024-04-16 | Discharge status: Against Medical Advice | Attending: Emergency Medicine | Admitting: Emergency Medicine

## 2024-04-16 ENCOUNTER — Ambulatory Visit: Payer: Self-pay

## 2024-04-16 ENCOUNTER — Emergency Department (HOSPITAL_COMMUNITY)
Admission: EM | Admit: 2024-04-16 | Discharge: 2024-04-16 | Disposition: A | Discharge status: Home / Self Care | Source: Home / Self Care | Attending: Emergency Medicine | Admitting: Emergency Medicine

## 2024-04-16 ENCOUNTER — Emergency Department (HOSPITAL_COMMUNITY)

## 2024-04-16 ENCOUNTER — Other Ambulatory Visit: Payer: Self-pay

## 2024-04-16 DIAGNOSIS — J069 Acute upper respiratory infection, unspecified: Secondary | ICD-10-CM | POA: Diagnosis not present

## 2024-04-16 DIAGNOSIS — R5383 Other fatigue: Secondary | ICD-10-CM | POA: Diagnosis present

## 2024-04-16 DIAGNOSIS — Z5321 Procedure and treatment not carried out due to patient leaving prior to being seen by health care provider: Secondary | ICD-10-CM | POA: Diagnosis not present

## 2024-04-16 DIAGNOSIS — H9209 Otalgia, unspecified ear: Secondary | ICD-10-CM | POA: Diagnosis not present

## 2024-04-16 DIAGNOSIS — R531 Weakness: Secondary | ICD-10-CM | POA: Diagnosis not present

## 2024-04-16 DIAGNOSIS — R0981 Nasal congestion: Secondary | ICD-10-CM | POA: Insufficient documentation

## 2024-04-16 DIAGNOSIS — R059 Cough, unspecified: Secondary | ICD-10-CM | POA: Diagnosis not present

## 2024-04-16 LAB — URINALYSIS, ROUTINE W REFLEX MICROSCOPIC
Bacteria, UA: NONE SEEN
Bilirubin Urine: NEGATIVE
Glucose, UA: NEGATIVE mg/dL
Ketones, ur: NEGATIVE mg/dL
Nitrite: NEGATIVE
Protein, ur: NEGATIVE mg/dL
Specific Gravity, Urine: 1.011 (ref 1.005–1.030)
pH: 7 (ref 5.0–8.0)

## 2024-04-16 LAB — CBC WITH DIFFERENTIAL/PLATELET
Abs Immature Granulocytes: 0.01 10*3/uL (ref 0.00–0.07)
Basophils Absolute: 0 10*3/uL (ref 0.0–0.1)
Basophils Relative: 0 %
Eosinophils Absolute: 0.3 10*3/uL (ref 0.0–0.5)
Eosinophils Relative: 4 %
HCT: 37.6 % (ref 36.0–46.0)
Hemoglobin: 11.8 g/dL — ABNORMAL LOW (ref 12.0–15.0)
Immature Granulocytes: 0 %
Lymphocytes Relative: 28 %
Lymphs Abs: 2 10*3/uL (ref 0.7–4.0)
MCH: 27.6 pg (ref 26.0–34.0)
MCHC: 31.4 g/dL (ref 30.0–36.0)
MCV: 88.1 fL (ref 80.0–100.0)
Monocytes Absolute: 0.6 10*3/uL (ref 0.1–1.0)
Monocytes Relative: 8 %
Neutro Abs: 4.2 10*3/uL (ref 1.7–7.7)
Neutrophils Relative %: 60 %
Platelets: 314 10*3/uL (ref 150–400)
RBC: 4.27 MIL/uL (ref 3.87–5.11)
RDW: 18.1 % — ABNORMAL HIGH (ref 11.5–15.5)
WBC: 7 10*3/uL (ref 4.0–10.5)
nRBC: 0 % (ref 0.0–0.2)

## 2024-04-16 LAB — COMPREHENSIVE METABOLIC PANEL WITH GFR
ALT: 13 U/L (ref 0–44)
AST: 18 U/L (ref 15–41)
Albumin: 4 g/dL (ref 3.5–5.0)
Alkaline Phosphatase: 72 U/L (ref 38–126)
Anion gap: 11 (ref 5–15)
BUN: 5 mg/dL — ABNORMAL LOW (ref 6–20)
CO2: 26 mmol/L (ref 22–32)
Calcium: 9.8 mg/dL (ref 8.9–10.3)
Chloride: 102 mmol/L (ref 98–111)
Creatinine, Ser: 0.77 mg/dL (ref 0.44–1.00)
GFR, Estimated: >60 mL/min (ref >60)
Glucose, Bld: 101 mg/dL — ABNORMAL HIGH (ref 70–99)
Potassium: 3.3 mmol/L — ABNORMAL LOW (ref 3.5–5.1)
Sodium: 139 mmol/L (ref 135–145)
Total Bilirubin: 0.5 mg/dL (ref 0.0–1.2)
Total Protein: 8.1 g/dL (ref 6.5–8.1)

## 2024-04-16 LAB — RESP PANEL BY RT-PCR (RSV, FLU A&B, COVID)  RVPGX2
Influenza A by PCR: NEGATIVE
Influenza B by PCR: NEGATIVE
Resp Syncytial Virus by PCR: NEGATIVE
SARS Coronavirus 2 by RT PCR: NEGATIVE

## 2024-04-16 LAB — CK: Total CK: 119 U/L (ref 38–234)

## 2024-04-16 MED ORDER — HYDROCOD POLI-CHLORPHE POLI ER 10-8 MG/5ML PO SUER: 5.0000 mL | Freq: Two times a day (BID) | ORAL | 0 refills | Status: DC

## 2024-04-16 NOTE — ED Provider Triage Note (Signed)
 Emergency Medicine Provider Triage Evaluation Note  Kayla Adkins , a 43 y.o. female  was evaluated in triage.  Pt complains of URI symptoms time 8 days, history of viral myositis and rhabdomyolysis.  Complaining of heaviness and weakness in the legs.  Feels the same as previous history of myositis. No urine changes  Review of Systems  Positive: cough Negative: Dark urine  Physical Exam  BP (!) 161/105 (BP Location: Right Arm)   Pulse (!) 106   Temp 98.6 F (37 C)   Resp 18   SpO2 99%  Gen:   Awake, no distress   Resp:  Normal effort  MSK:   Moves extremities without difficulty  Other:    Medical Decision Making  Medically screening exam initiated at 3:12 PM.  Appropriate orders placed.  Kayla Adkins was informed that the remainder of the evaluation will be completed by another provider, this initial triage assessment does not replace that evaluation, and the importance of remaining in the ED until their evaluation is complete.     Tama Fails, PA-C 04/16/24 1513

## 2024-04-16 NOTE — ED Triage Notes (Signed)
 Pt states for the last 8 days she's had URI symptoms including malaise, myalgias, cough, congestion, chills. Hx of rhabdo and states that pain feels similar. Urine is yellow per pt.

## 2024-04-16 NOTE — ED Notes (Signed)
 Patient called to get discharged from triage no answer

## 2024-04-16 NOTE — Discharge Instructions (Signed)
You appear to have an upper respiratory infection (URI). An upper respiratory tract infection, or cold, is a viral infection of the air passages leading to the lungs. It is contagious and can be spread to others, especially during the first 3 or 4 days. It cannot be cured by antibiotics or other medicines. °RETURN IMMEDIATELY IF you develop shortness of breath, confusion or altered mental status, a new rash, become dizzy, faint, or poorly responsive, or are unable to be cared for at home. ° °

## 2024-04-16 NOTE — ED Provider Notes (Signed)
  EMERGENCY DEPARTMENT AT [facility] Provider Note   CSN: [id number] Arrival date & time: 04/16/24  2206     History  No chief complaint on file.   Kayla Adkins is a 43 y.o. female who presents the emergency department for evaluation of URI.  She was seen by me earlier in triage but left prior to evaluation.  She has a history of previous viral myositis and has had 8 days of cough and URI symptoms.  She is complaining of fatigue. No dark urine.  No fevers.  She has ear fullness nasal congestion   HPI     Home Medications Prior to Admission medications   Medication Sig Start Date End Date Taking? Authorizing Provider  acetaminophen  (TYLENOL ) 500 MG tablet Take 1 tablet (500 mg total) by mouth every 6 (six) hours as needed for mild pain. 03/30/22   Redwine, Madison A, PA-C  amLODipine -olmesartan  (AZOR ) 5-20 MG tablet Take 1 tablet by mouth daily. 01/29/24   Alexander-Savino, Washington, MD  ferrous sulfate  325 (65 FE) MG tablet Take 1 tablet (325 mg total) by mouth daily. 01/29/24 05/28/24  Alexander-Savino, Washington, MD  meclizine  (ANTIVERT ) 25 MG tablet Take 1 tablet (25 mg total) by mouth 3 (three) times daily as needed for dizziness. 05/17/23   Malen Scudder, DO      Allergies    Patient has no known allergies.    Review of Systems   Review of Systems  Physical Exam Updated Vital Signs There were no vitals taken for this visit. Physical Exam Vitals and nursing note reviewed.  Constitutional:      General: She is not in acute distress.    Appearance: She is well-developed. She is ill-appearing. She is not toxic-appearing or diaphoretic.  HENT:     Head: Normocephalic and atraumatic.     Right Ear: Tympanic membrane and external ear normal.     Left Ear: Tympanic membrane and external ear normal.     Nose: Nose normal.     Mouth/Throat:     Mouth: Mucous membranes are moist.  Eyes:     General: No scleral icterus.    Extraocular Movements: Extraocular  movements intact.     Conjunctiva/sclera: Conjunctivae normal.     Pupils: Pupils are equal, round, and reactive to light.  Cardiovascular:     Rate and Rhythm: Normal rate and regular rhythm.     Heart sounds: Normal heart sounds. No murmur heard.    No friction rub. No gallop.  Pulmonary:     Effort: Pulmonary effort is normal. No respiratory distress.     Breath sounds: Normal breath sounds. No wheezing.  Abdominal:     General: Bowel sounds are normal. There is no distension.     Palpations: Abdomen is soft. There is no mass.     Tenderness: There is no abdominal tenderness. There is no guarding.  Musculoskeletal:     Cervical back: Normal range of motion.  Skin:    General: Skin is warm and dry.  Neurological:     Mental Status: She is alert and oriented to person, place, and time.  Psychiatric:        Behavior: Behavior normal.     ED Results / Procedures / Treatments   Labs (all labs ordered are listed, but only abnormal results are displayed) Labs Reviewed - No data to display  EKG None  Radiology DG Chest 2 View Result Date: 04/16/2024 CLINICAL DATA:  cough EXAM: CHEST - 2 VIEW  COMPARISON:  February 26, 2020 FINDINGS: No focal airspace consolidation, pleural effusion, or pneumothorax. No cardiomegaly. No acute fracture or destructive lesion. IMPRESSION: No acute cardiopulmonary abnormality. Electronically Signed   By: Rance Burrows M.D.   On: 04/16/2024 16:16    Procedures Procedures    Medications Ordered in ED Medications - No data to display  ED Course/ Medical Decision Making/ A&P                                 Medical Decision Making  Results for orders placed or performed during the hospital encounter of 04/16/24  Urinalysis, Routine w reflex microscopic -Urine, Clean Catch   Collection Time: 04/16/24  3:10 PM  Result Value Ref Range   Color, Urine YELLOW YELLOW   APPearance HAZY (A) CLEAR   Specific Gravity, Urine 1.011 1.005 - 1.030   pH 7.0  5.0 - 8.0   Glucose, UA NEGATIVE NEGATIVE mg/dL   Hgb urine dipstick SMALL (A) NEGATIVE   Bilirubin Urine NEGATIVE NEGATIVE   Ketones, ur NEGATIVE NEGATIVE mg/dL   Protein, ur NEGATIVE NEGATIVE mg/dL   Nitrite NEGATIVE NEGATIVE   Leukocytes,Ua MODERATE (A) NEGATIVE   RBC / HPF 0-5 0 - 5 RBC/hpf   WBC, UA 21-50 0 - 5 WBC/hpf   Bacteria, UA NONE SEEN NONE SEEN   Squamous Epithelial / HPF 6-10 0 - 5 /HPF   Mucus PRESENT   Resp panel by RT-PCR (RSV, Flu A&B, Covid) Anterior Nasal Swab   Collection Time: 04/16/24  3:12 PM   Specimen: Anterior Nasal Swab  Result Value Ref Range   SARS Coronavirus 2 by RT PCR NEGATIVE NEGATIVE   Influenza A by PCR NEGATIVE NEGATIVE   Influenza B by PCR NEGATIVE NEGATIVE   Resp Syncytial Virus by PCR NEGATIVE NEGATIVE  Comprehensive metabolic panel   Collection Time: 04/16/24  3:35 PM  Result Value Ref Range   Sodium 139 135 - 145 mmol/L   Potassium 3.3 (L) 3.5 - 5.1 mmol/L   Chloride 102 98 - 111 mmol/L   CO2 26 22 - 32 mmol/L   Glucose, Bld 101 (H) 70 - 99 mg/dL   BUN 5 (L) 6 - 20 mg/dL   Creatinine, Ser 1.61 0.44 - 1.00 mg/dL   Calcium 9.8 8.9 - 09.6 mg/dL   Total Protein 8.1 6.5 - 8.1 g/dL   Albumin 4.0 3.5 - 5.0 g/dL   AST 18 15 - 41 U/L   ALT 13 0 - 44 U/L   Alkaline Phosphatase 72 38 - 126 U/L   Total Bilirubin 0.5 0.0 - 1.2 mg/dL   GFR, Estimated >04 >54 mL/min   Anion gap 11 5 - 15  CBC with Differential   Collection Time: 04/16/24  3:35 PM  Result Value Ref Range   WBC 7.0 4.0 - 10.5 K/uL   RBC 4.27 3.87 - 5.11 MIL/uL   Hemoglobin 11.8 (L) 12.0 - 15.0 g/dL   HCT 09.8 11.9 - 14.7 %   MCV 88.1 80.0 - 100.0 fL   MCH 27.6 26.0 - 34.0 pg   MCHC 31.4 30.0 - 36.0 g/dL   RDW 82.9 (H) 56.2 - 13.0 %   Platelets 314 150 - 400 K/uL   nRBC 0.0 0.0 - 0.2 %   Neutrophils Relative % 60 %   Neutro Abs 4.2 1.7 - 7.7 K/uL   Lymphocytes Relative 28 %   Lymphs Abs 2.0 0.7 -  4.0 K/uL   Monocytes Relative 8 %   Monocytes Absolute 0.6 0.1 - 1.0  K/uL   Eosinophils Relative 4 %   Eosinophils Absolute 0.3 0.0 - 0.5 K/uL   Basophils Relative 0 %   Basophils Absolute 0.0 0.0 - 0.1 K/uL   Immature Granulocytes 0 %   Abs Immature Granulocytes 0.01 0.00 - 0.07 K/uL  CK   Collection Time: 04/16/24  3:35 PM  Result Value Ref Range   Total CK 119 38 - 234 U/L    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  I reviewed the patient's labs from previous visit There are no acute findings.  Chest x-ray negative for pneumonia.  Symptoms are not consistent with such.   She is requesting prescription cough medicine.  Will discharge home.  Discussed return precautions.  No evidence of rhabdo       Final Clinical Impression(s) / ED Diagnoses Final diagnoses:  None    Rx / DC Orders ED Discharge Orders     None         Tama Fails, PA-C 04/16/24 2221    Arvilla Birmingham, MD 04/16/24 [phone]

## 2024-04-16 NOTE — ED Notes (Signed)
 ED Provider at bedside.

## 2024-04-16 NOTE — Telephone Encounter (Addendum)
 First attempt; no answer, phone was busy.   Copied From CRM [id number]. Reason for Triage: cough, lack of energy, ears popping. Question if she has an elevated CK level.    Second attempt; phone is still busy.  Third attempt; no answer

## 2024-04-16 NOTE — ED Triage Notes (Signed)
 Pt was seen earlier in [facility], had labs drawn due to c/o abnormal labs, and left prior to results being discussed. Pt still endorses ear pain. Request cough medication.

## 2024-04-21 ENCOUNTER — Telehealth: Payer: Self-pay | Admitting: *Deleted

## 2024-04-21 NOTE — Telephone Encounter (Signed)
       Kayla Adkins, [name]  Imp Clinical17 hours ago (4:19 PM)   CH Does this pt need to be seen?  Please advise.  Thanks,  Charsetta     [name]  P Imp Admin (supporting Jonelle Neri, DO)4 days ago    Hello, thank you for replying. The ER told me that I just had a bad cold. They gave me some cough medicine to get because the cough was so uncontrollable. But if the doctor need to see me next Wednesday, yes please make that appointment and I'll be there. But so far all my test results look good Dr said.    [name] Cheron Corning Ingram4 days ago   [facility] We are closed today.  My first available appointment slot is not until next Wednesday.  Just let me know what you would like to do.   Thanks,   [name].   Front Desk    Jenel Mires  P Imp Admin (supporting Jonelle Neri, DO)5 days ago    Appointment Request From: Jenel Mires   With Provider: Jonelle Neri [facility] [place]]   Preferred Date Range: 04/17/2024 - 04/17/2024   Preferred Times: Any Time   Reason for visit: Office Visit   Health Maintenance Topic:    Comments: I went to [facility] yesterday. The weight was too long. I had to leave cuz I didn't feel well. Is it anyhow I can get an appointment for Friday to come in and go over my test resorts? Some things wasn't good

## 2024-04-21 NOTE — Telephone Encounter (Signed)
 Good morning I called you to see how you are doing, but the telephone is not in service. If you need a follow-uo appointment, just call the office. [phone]. Thanks Economist

## 2024-06-07 ENCOUNTER — Encounter (HOSPITAL_COMMUNITY): Payer: Self-pay

## 2024-06-07 ENCOUNTER — Other Ambulatory Visit: Payer: Self-pay

## 2024-06-07 ENCOUNTER — Emergency Department (HOSPITAL_COMMUNITY)
Admission: EM | Admit: 2024-06-07 | Discharge: 2024-06-07 | Disposition: A | Discharge status: Home / Self Care | Attending: Emergency Medicine | Admitting: Emergency Medicine

## 2024-06-07 DIAGNOSIS — I10 Essential (primary) hypertension: Secondary | ICD-10-CM | POA: Insufficient documentation

## 2024-06-07 DIAGNOSIS — M25571 Pain in right ankle and joints of right foot: Secondary | ICD-10-CM | POA: Diagnosis present

## 2024-06-07 DIAGNOSIS — M25471 Effusion, right ankle: Secondary | ICD-10-CM | POA: Diagnosis not present

## 2024-06-07 DIAGNOSIS — Z79899 Other long term (current) drug therapy: Secondary | ICD-10-CM | POA: Insufficient documentation

## 2024-06-07 DIAGNOSIS — R2243 Localized swelling, mass and lump, lower limb, bilateral: Secondary | ICD-10-CM | POA: Diagnosis not present

## 2024-06-07 DIAGNOSIS — M7989 Other specified soft tissue disorders: Secondary | ICD-10-CM | POA: Diagnosis not present

## 2024-06-07 LAB — COMPREHENSIVE METABOLIC PANEL WITH GFR
ALT: 12 U/L (ref 0–44)
AST: 14 U/L — ABNORMAL LOW (ref 15–41)
Albumin: 3.4 g/dL — ABNORMAL LOW (ref 3.5–5.0)
Alkaline Phosphatase: 62 U/L (ref 38–126)
Anion gap: 7 (ref 5–15)
BUN: 10 mg/dL (ref 6–20)
CO2: 26 mmol/L (ref 22–32)
Calcium: 9.1 mg/dL (ref 8.9–10.3)
Chloride: 105 mmol/L (ref 98–111)
Creatinine, Ser: 0.81 mg/dL (ref 0.44–1.00)
GFR, Estimated: >60 mL/min (ref >60)
Glucose, Bld: 91 mg/dL (ref 70–99)
Potassium: 3.5 mmol/L (ref 3.5–5.1)
Sodium: 138 mmol/L (ref 135–145)
Total Bilirubin: 0.3 mg/dL (ref 0.0–1.2)
Total Protein: 6.8 g/dL (ref 6.5–8.1)

## 2024-06-07 LAB — CBC WITH DIFFERENTIAL/PLATELET
Abs Immature Granulocytes: 0.01 K/uL (ref 0.00–0.07)
Basophils Absolute: 0 K/uL (ref 0.0–0.1)
Basophils Relative: 0 %
Eosinophils Absolute: 0.1 K/uL (ref 0.0–0.5)
Eosinophils Relative: 3 %
HCT: 30.4 % — ABNORMAL LOW (ref 36.0–46.0)
Hemoglobin: 9.7 g/dL — ABNORMAL LOW (ref 12.0–15.0)
Immature Granulocytes: 0 %
Lymphocytes Relative: 52 %
Lymphs Abs: 2.7 K/uL (ref 0.7–4.0)
MCH: 28.7 pg (ref 26.0–34.0)
MCHC: 31.9 g/dL (ref 30.0–36.0)
MCV: 89.9 fL (ref 80.0–100.0)
Monocytes Absolute: 0.3 K/uL (ref 0.1–1.0)
Monocytes Relative: 5 %
Neutro Abs: 2.1 K/uL (ref 1.7–7.7)
Neutrophils Relative %: 40 %
Platelets: 289 K/uL (ref 150–400)
RBC: 3.38 MIL/uL — ABNORMAL LOW (ref 3.87–5.11)
RDW: 14.6 % (ref 11.5–15.5)
WBC: 5.3 K/uL (ref 4.0–10.5)
nRBC: 0 % (ref 0.0–0.2)

## 2024-06-07 LAB — CK: Total CK: 126 U/L (ref 38–234)

## 2024-06-07 LAB — I-STAT CG4 LACTIC ACID, ED: Lactic Acid, Venous: 0.6 mmol/L (ref 0.5–1.9)

## 2024-06-07 LAB — HCG, SERUM, QUALITATIVE: Preg, Serum: NEGATIVE

## 2024-06-07 NOTE — ED Triage Notes (Signed)
 Pt states last night both of her ankles started swelling and has worsened today. Pt c/o pain in legs and feet.

## 2024-06-07 NOTE — Discharge Instructions (Addendum)
 It was a pleasure taking care of you today.  Based on your history, physical exam, and labs I feel you are safe for discharge.  Today no acute cause for your ankle swelling was found.  Recommend follow-up with your primary care provider this upcoming week for ongoing diagnosis and treatment.  Please continue to monitor your symptoms, if swelling worsens, swelling moves up your leg, if you have color changes of your leg, severe pain, chest pain, shortness of breath or other concerning symptom, please return to the emergency department or seek further medical care.

## 2024-06-07 NOTE — ED Provider Notes (Signed)
 [place] EMERGENCY DEPARTMENT AT [facility] Provider Note   CSN: [id number] Arrival date & time: 06/07/24  1336     Patient presents with: Joint Swelling   Kayla Adkins is a 43 y.o. female who presents to the emergency department with a chief complaint of bilateral ankle swelling and foot pain in both feet and legs.  Patient has past medical history significant for rhabdomyolysis, hypertension, vertigo, neuropathy, etc. patient states that ever since her previous episode of rhabdomyolysis she was admitted for she has had persistent left and right lower extremity pain, however she states that her ankles normally never swell.  She states that both of her feet have felt more heavy and slightly more painful over the last 1 to 2 days.  Patient denies feeling dehydrated, denies dark urine, however states that she would like her CK level checked to be sure that she is not going into rhabdomyolysis again.  Denies fever, chills, chest pain, shortness of breath, abdominal pain.   HPI     Prior to Admission medications   Medication Sig Start Date End Date Taking? Authorizing Provider  acetaminophen  (TYLENOL ) 500 MG tablet Take 1 tablet (500 mg total) by mouth every 6 (six) hours as needed for mild pain. 03/30/22   Redwine, Madison A, PA-C  amLODipine -olmesartan  (AZOR ) 5-20 MG tablet Take 1 tablet by mouth daily. 01/29/24   Alexander-Savino, Washington, MD  chlorpheniramine-HYDROcodone  (TUSSIONEX) 10-8 MG/5ML Take 5 mLs by mouth 2 (two) times daily. 04/16/24   Harris, Abigail, PA-C  ferrous sulfate  325 (65 FE) MG tablet Take 1 tablet (325 mg total) by mouth daily. 01/29/24 05/28/24  Alexander-Savino, Washington, MD  meclizine  (ANTIVERT ) 25 MG tablet Take 1 tablet (25 mg total) by mouth 3 (three) times daily as needed for dizziness. 05/17/23   Addie Perkins, DO    Allergies: Patient has no known allergies.    Review of Systems  Cardiovascular:  Positive for leg swelling.    Updated Vital  Signs BP (!) 168/102   Pulse 61   Temp 97.8 F (36.6 C) (Oral)   Resp 18   Ht 5' 3 (1.6 m)   Wt 93 kg   LMP 06/04/2024 (Approximate)   SpO2 98%   BMI 36.32 kg/m   Physical Exam Vitals and nursing note reviewed.  Constitutional:      General: She is awake. She is not in acute distress.    Appearance: Normal appearance. She is not ill-appearing, toxic-appearing or diaphoretic.  HENT:     Head: Normocephalic and atraumatic.  Eyes:     General: No scleral icterus. Cardiovascular:     Rate and Rhythm: Normal rate and regular rhythm.  Pulmonary:     Effort: Pulmonary effort is normal. No respiratory distress.     Breath sounds: Normal breath sounds. No wheezing, rhonchi or rales.  Musculoskeletal:        General: Swelling (Mild swelling present to right foot and ankle.) present. Normal range of motion.     Right lower leg: No edema.     Left lower leg: No edema.  Feet:     Comments: DP and PT pulses of bilateral lower extremities confirmed with doppler, strong pulses Skin:    General: Skin is warm and dry.     Capillary Refill: Capillary refill takes less than 2 seconds.  Neurological:     General: No focal deficit present.     Mental Status: She is alert and oriented to person, place, and time.  Sensory: No sensory deficit.     Motor: No weakness.     Coordination: Coordination normal.  Psychiatric:        Mood and Affect: Mood normal.        Behavior: Behavior normal. Behavior is cooperative.     (all labs ordered are listed, but only abnormal results are displayed) Labs Reviewed  COMPREHENSIVE METABOLIC PANEL WITH GFR - Abnormal; Notable for the following components:      Result Value   Albumin 3.4 (*)    AST 14 (*)    All other components within normal limits  CBC WITH DIFFERENTIAL/PLATELET - Abnormal; Notable for the following components:   RBC 3.38 (*)    Hemoglobin 9.7 (*)    HCT 30.4 (*)    All other components within normal limits  HCG, SERUM,  QUALITATIVE  CK  I-STAT CG4 LACTIC ACID, ED    EKG: None  Radiology: No results found.   Procedures   Medications Ordered in the ED - No data to display  Clinical Course as of 06/07/24 2148  [place] Jun 07, 2024  1608 Pending CK  [CH]    Clinical Course User Index [CH] Kingson Lohmeyer, Terrall FALCON, PA-C                                 Medical Decision Making Amount and/or Complexity of Data Reviewed Labs: ordered.   Patient presents to the ED for concern of bilateral foot pain and swelling, this involves an extensive number of treatment options, and is a complaint that carries with it a high risk of complications and morbidity.  The differential diagnosis includes rhabdomyolysis, DVT, trauma/injury, diabetic neuropathy, etc.   Co morbidities that complicate the patient evaluation  History of rhabdomyolysis, hypertension, neuropathy, prediabetes, iron deficiency anemia   Additional history obtained:  Chart reviewed including previous visit in 2021 for neuropathy, this visit on 04/24/2022 reviewed for pain in both lower extremities   Lab Tests:  I Ordered, and personally interpreted labs.  The pertinent results include: CBC unremarkable other than previously diagnosed anemia, patient states that she has stopped taking iron at home, recommended follow-up with her primary care provider about this and possibly continue iron, CMP unremarkable, CK unremarkable, lactic acid unremarkable, serum pregnancy negative   Medicines ordered and prescription drug management:  I have reviewed the patients home medicines and have made adjustments as needed   Test Considered:  DVT ultrasound rule out: Very low clinical suspicion for DVT as patient denies calf tenderness, no obvious risk factors including travel, estrogen replacement, birth control, previous history of blood clot, coagulopathy disorder, etc. patient PERC negative.   Critical Interventions:  none   Problem List / ED  Course:  43 year old female, complaining of bilateral ankle swelling, patient has past medical history significant rhabdomyolysis and continues to have chronic pain from this Patient saw ankle swelling and is concerned that she might be going into rhabdo episode again Physical exam benign other than mild right ankle swelling, no weakness appreciated of either lower extremity, patient able to dorsiflex and plantarflex of both feet, no calf tenderness bilaterally, no redness, no swelling Lab workup in the emergency department today overall reassuring other than slight anemia, patient instructed to follow-up with her primary care provider about this, low clinical suspicion for rhabdomyolysis based on labs Denies trauma/injury Patient educated on workup and told to continue to monitor symptoms at home, return precautions given, patient  discharged Very low clinical suspicion for acute life-threatening cause of ankle swelling, low clinical suspicion for DVT, patient PERC negative, patient does not have a past medical history significant for lower extremity edema or heart failure, patient denies chest pain or shortness of breath, doubt something like heart failure exacerbation Patient also denies trauma/injury so doubt that is because of swelling Patient instructed to follow-up with primary care provider for ongoing diagnosis and treatment  Reevaluation:  After the interventions noted above, I reevaluated the patient and found that they have :stayed the same   Social Determinants of Health:  none   Dispostion:  After consideration of the diagnostic results and the patients response to treatment, I feel that the patent would benefit from charge and outpatient follow-up as instructed.  Return precautions given.  Patient discharged.     Final diagnoses:  Ankle swelling, right    ED Discharge Orders     None          Janetta Terrall JULIANNA DEVONNA 06/07/24 2148    Melvenia Motto, MD 06/07/24  2238

## 2024-06-07 NOTE — ED Notes (Signed)
 Called lab to ad-on CK

## 2024-06-08 ENCOUNTER — Ambulatory Visit: Payer: Self-pay | Admitting: *Deleted

## 2024-06-08 NOTE — Telephone Encounter (Signed)
 Copied from CRM [id number]. Topic: Clinical - Red Word Triage >> Jun 08, 2024  9:52 AM Merlynn LABOR wrote: Red Word that prompted transfer to Nurse Triage: Ankle swelling both ankles Reason for Disposition  MILD or MODERATE ankle swelling (e.g., can't move joint normally, can't do usual activities) (Exceptions: Itchy, localized swelling; swelling is chronic.)  Answer Assessment - Initial Assessment Questions 1. LOCATION: Which ankle is swollen? Where is the swelling?     I'm having swelling in both ankles.   2. ONSET: When did the swelling start?     Thursday.    This is not normal.  I'm also hurting in my feet and ankles. My right ankle on outside side.   Left ankle swelling too.     I have been on my feet a lot. My  3. SWELLING: How bad is the swelling? Or, How large is it? (e.g., mild, moderate, severe; size of localized swelling)    - NONE: No joint swelling.   - LOCALIZED: Localized; small area of puffy or swollen skin (e.g., insect bite, skin irritation).   - MILD: Joint looks or feels mildly swollen or puffy.   - MODERATE: Swollen; interferes with normal activities (e.g., work or school); decreased range of movement; may be limping.   - SEVERE: Very swollen; can't move swollen joint at all; limping a lot or unable to walk.     I went to ED yesterday.  Everything was fine.   But I'm still having swelling and I hurting.   I want my doctor to look at it.    4. PAIN: Is there any pain? If Yes, ask: How bad is it? (Scale 1-10; or mild, moderate, severe)   - NONE (0): no pain.   - MILD (1-3): doesn't interfere with normal activities.    - MODERATE (4-7): interferes with normal activities (e.g., work or school) or awakens from sleep, limping.    - SEVERE (8-10): excruciating pain, unable to do any normal activities, unable to walk.      Pain mild.    5. CAUSE: What do you think caused the ankle swelling?     I don't know.    The ED didn't know what else to have.    They said it  was not a blood clot in my legs. 6. OTHER SYMPTOMS: Do you have any other symptoms? (e.g., fever, chest pain, difficulty breathing, calf pain)     No shortness of breath or chest pain.    I feel normal. I hurt every day.  This has been going on for 5 years.  I have muscle decay.  My legs are always tired and my feet are tired all the time. 7. PREGNANCY: Is there any chance you are pregnant? When was your last menstrual period?     No  Protocols used: Ankle Swelling-A-AH FYI Only or Action Required?: FYI only for provider.  Patient was last seen in primary care on 01/29/2024 by Volney Leash, MD. Called Nurse Triage reporting Ankle Pain. Symptoms began several days ago. Interventions attempted: Nothing. Symptoms are: gradually worsening.  Triage Disposition: See PCP When Office is Open (Within 3 Days)  Patient/caregiver understands and will follow disposition?: Yes Bilateral ankle swelling.  Seen ED 06/07/2024.   Unable to schedule due to no scheduling template available.   Warm transferred call into the Internal Medicine Clinic for scheduling.

## 2024-06-10 ENCOUNTER — Ambulatory Visit

## 2024-06-10 ENCOUNTER — Other Ambulatory Visit: Payer: Self-pay

## 2024-06-10 VITALS — BP 135/78 | HR 84 | Temp 98.2°F | Ht 66.0 in | Wt 198.6 lb

## 2024-06-10 DIAGNOSIS — R5383 Other fatigue: Secondary | ICD-10-CM | POA: Diagnosis not present

## 2024-06-10 DIAGNOSIS — D509 Iron deficiency anemia, unspecified: Secondary | ICD-10-CM | POA: Diagnosis not present

## 2024-06-10 DIAGNOSIS — Z0000 Encounter for general adult medical examination without abnormal findings: Secondary | ICD-10-CM

## 2024-06-10 DIAGNOSIS — Z1239 Encounter for other screening for malignant neoplasm of breast: Secondary | ICD-10-CM

## 2024-06-10 DIAGNOSIS — D500 Iron deficiency anemia secondary to blood loss (chronic): Secondary | ICD-10-CM

## 2024-06-10 DIAGNOSIS — G629 Polyneuropathy, unspecified: Secondary | ICD-10-CM | POA: Diagnosis not present

## 2024-06-10 LAB — GLUCOSE, CAPILLARY: Glucose-Capillary: 103 mg/dL — ABNORMAL HIGH (ref 70–99)

## 2024-06-10 NOTE — Assessment & Plan Note (Signed)
 Bilateral breast mammogram was ordered.

## 2024-06-10 NOTE — Assessment & Plan Note (Signed)
 The patient has experienced leg swelling for five days, initially in the right leg and then both legs. CK levels were normal, and DVT was ruled out. After two days, she developed numbness but denies headache, shortness of breath, nausea, or vomiting.   Physical Exam: Heart and lung sounds were clear. No objective leg swelling noted. Sensation was intact bilaterally, and distal pedal pulses were normal.  Assessment: Labs are notable for iron deficiency anemia with hemoglobin 9.7 and ferritin 8. Given the new onset numbness, B12 was checked to evaluate for potential neuropathy.  Plan:  Started OTC ferrous sulfate  and magnesium; sent to community pharmacy for pickup. Advised to take ferrous sulfate  with vitamin C to support absorption. No side effects reported so far. We discussed the possibility of starting Symbalta in the future if symptoms persist. The patient was encouraged to continue taking ferrous sulfate  with vitamin C, and she has not reported any side effects so far.

## 2024-06-10 NOTE — Assessment & Plan Note (Signed)
 The patient reports fatigue, chronic cramps, and numbness in her hands and feet. She also mentioned low energy levels that affect her ability to exercise. Her recent blood sugar was 103. Given her low ferritin, we discussed the importance of adherence to ferrous sulfate  and will recheck hemoglobin and ferritin at the next visit. We also ordered TSH and vitamin B12 to further evaluate her symptoms and started her on magnesium supplementation.

## 2024-06-10 NOTE — Progress Notes (Signed)
 CC: leg swelling   HPI:  [name] is a 43 y.o. female living with a history stated below and presents today for F/U on bilateral leg swelling and numbness. Past Medical History:  Diagnosis Date   Hypertension    Rhabdomyolysis 07/16/2018   Vertigo    Viral myositis 10/03/2018    Current Outpatient Medications on File Prior to Visit  Medication Sig Dispense Refill   amLODipine -olmesartan  (AZOR ) 5-20 MG tablet Take 1 tablet by mouth daily. 90 tablet 2   ferrous sulfate  325 (65 FE) MG tablet Take 1 tablet (325 mg total) by mouth daily. 90 tablet 0   meclizine  (ANTIVERT ) 25 MG tablet Take 1 tablet (25 mg total) by mouth 3 (three) times daily as needed for dizziness. 30 tablet 0   No current facility-administered medications on file prior to visit.    History reviewed. No pertinent family history.  Social History   Socioeconomic History   Marital status: Married    Spouse name: Not on file   Number of children: Not on file   Years of education: Not on file   Highest education level: Not on file  Occupational History   Not on file  Tobacco Use   Smoking status: Former    Current packs/day: 0.00    Average packs/day: 0.3 packs/day for 2.0 years (0.5 ttl pk-yrs)    Types: Cigarettes    Start date: 08/06/2011    Quit date: 08/05/2013    Years since quitting: 10.8   Smokeless tobacco: Never  Substance and Sexual Activity   Alcohol use: Yes    Comment: sometimes   Drug use: No   Sexual activity: Not on file  Other Topics Concern   Not on file  Social History Narrative   Not on file   Social Drivers of Health   Financial Resource Strain: Not on file  Food Insecurity: No Food Insecurity (11/21/2022)   Hunger Vital Sign    Worried About Running Out of Food in the Last Year: Never true    Ran Out of Food in the Last Year: Never true  Transportation Needs: No Transportation Needs (11/21/2022)   PRAPARE - Administrator, Civil Service (Medical): No     Lack of Transportation (Non-Medical): No  Physical Activity: Not on file  Stress: Not on file  Social Connections: Moderately Integrated (11/20/2022)   Social Connection and Isolation Panel    Frequency of Communication with Friends and Family: More than three times a week    Frequency of Social Gatherings with Friends and Family: More than three times a week    Attends Religious Services: More than 4 times per year    Active Member of [employer] or Organizations: No    Attends Banker Meetings: Never    Marital Status: Married  Catering manager Violence: Not At Risk (11/21/2022)   Humiliation, Afraid, Rape, and Kick questionnaire    Fear of Current or Ex-Partner: No    Emotionally Abused: No    Physically Abused: No    Sexually Abused: No    Review of Systems: ROS negative except for what is noted on the assessment and plan.  Vitals:   06/10/24 0948  BP: 135/78  Pulse: 84  Temp: 98.2 F (36.8 C)  TempSrc: Oral  SpO2: 100%  Weight: 198 lb 9.6 oz (90.1 kg)  Height: 5' 6 (1.676 m)    Physical Exam  Physical Exam: Constitutional: well-appearing, in no acute distress HENT: normocephalic atraumatic,  mucous membranes moist Eyes: conjunctiva non-erythematous Neck: supple Cardiovascular: regular rate and rhythm, no m/r/g Pulmonary/Chest: normal work of breathing on room air, lungs clear to auscultation bilaterally Abdominal: soft, non-tender, non-distended MSK: lower ext were negative for edema, dorsalis pedis pulses were normal   Assessment & Plan:   Neuropathy The patient has experienced leg swelling for five days, initially in the right leg and then both legs. CK levels were normal, and DVT was ruled out. After two days, she developed numbness but denies headache, shortness of breath, nausea, or vomiting.   Physical Exam: Heart and lung sounds were clear. No objective leg swelling noted. Sensation was intact bilaterally, and distal pedal pulses were  normal.  Assessment: Labs are notable for iron deficiency anemia with hemoglobin 9.7 and ferritin 8. Given the new onset numbness, B12 was checked to evaluate for potential neuropathy.  Plan:  Started OTC ferrous sulfate  and magnesium; sent to community pharmacy for pickup. Advised to take ferrous sulfate  with vitamin C to support absorption. No side effects reported so far. We discussed the possibility of starting Symbalta in the future if symptoms persist. The patient was encouraged to continue taking ferrous sulfate  with vitamin C, and she has not reported any side effects so far.     Fatigue The patient reports fatigue, chronic cramps, and numbness in her hands and feet. She also mentioned low energy levels that affect her ability to exercise. Her recent blood sugar was 103. Given her low ferritin, we discussed the importance of adherence to ferrous sulfate  and will recheck hemoglobin and ferritin at the next visit. We also ordered TSH and vitamin B12 to further evaluate her symptoms and started her on magnesium supplementation.  Healthcare maintenance Bilateral breast mammogram was ordered.   Patient seen with Dr. CHARLENA Rosan Armando Bernadine M.D [facility], PGY-1 Phone: [phone] Date 06/10/2024 Time 2:33 PM

## 2024-06-10 NOTE — Assessment & Plan Note (Deleted)
 Hb is 9.7

## 2024-06-10 NOTE — Patient Instructions (Signed)
 Thank you, Mrs. Naeve, for allowing us  to provide your care today.  During your visit, we discussed the swelling and numbness in both legs. Fortunately, your CK levels and ultrasound results were normal. However, we did find that your iron levels were low. We've started you on iron supplements and magnesium, and we've also ordered a test to check your vitamin B12 levels.  We encourage you to stay active with regular exercise, take your iron pills with vitamin C to help absorption, and please don't hesitate to reach out if you have any questions.  I have ordered the following labs for you:  Lab Orders         Vitamin B12         TSH       T  Referrals ordered today:   Referral Orders  No referral(s) requested today     I have ordered the following medication/changed the following medications:   Stop the following medications: Medications Discontinued During This Encounter  Medication Reason   acetaminophen  (TYLENOL ) 500 MG tablet Completed Course   chlorpheniramine-HYDROcodone  (TUSSIONEX) 10-8 MG/5ML Completed Course     Start the following medications: No orders of the defined types were placed in this encounter.    Follow up: 6 months   Remember: Decrease salt intake, Healthy diet as we discussed, Leg Elevation  Should you have any questions or concerns please call the internal medicine clinic at [phone].    Armando Rossetti, M.D [facility]

## 2024-06-11 ENCOUNTER — Ambulatory Visit: Payer: Self-pay

## 2024-06-11 LAB — VITAMIN B12: Vitamin B-12: 458 pg/mL (ref 232–1245)

## 2024-06-11 LAB — TSH: TSH: 1.08 u[IU]/mL (ref 0.450–4.500)

## 2024-06-15 NOTE — Progress Notes (Signed)
Internal Medicine Clinic Attending  I was physically present during the key portions of the resident provided service and participated in the medical decision making of patient's management care. I reviewed pertinent patient test results.  The assessment, diagnosis, and plan were formulated together and I agree with the documentation in the resident's note.  Gust Rung, DO

## 2024-06-24 ENCOUNTER — Other Ambulatory Visit: Payer: Self-pay

## 2024-06-24 DIAGNOSIS — Z1231 Encounter for screening mammogram for malignant neoplasm of breast: Secondary | ICD-10-CM

## 2024-06-29 ENCOUNTER — Other Ambulatory Visit: Payer: Self-pay

## 2024-06-29 DIAGNOSIS — Z1231 Encounter for screening mammogram for malignant neoplasm of breast: Secondary | ICD-10-CM

## 2024-07-03 ENCOUNTER — Encounter

## 2024-07-03 DIAGNOSIS — Z1231 Encounter for screening mammogram for malignant neoplasm of breast: Secondary | ICD-10-CM

## 2024-07-06 ENCOUNTER — Ambulatory Visit: Admitting: *Deleted

## 2024-07-06 DIAGNOSIS — Z111 Encounter for screening for respiratory tuberculosis: Secondary | ICD-10-CM

## 2024-07-07 ENCOUNTER — Other Ambulatory Visit: Payer: Self-pay | Admitting: Obstetrics and Gynecology

## 2024-07-07 ENCOUNTER — Other Ambulatory Visit: Payer: Self-pay

## 2024-07-07 ENCOUNTER — Other Ambulatory Visit: Payer: Self-pay | Admitting: Internal Medicine

## 2024-07-07 DIAGNOSIS — Z1231 Encounter for screening mammogram for malignant neoplasm of breast: Secondary | ICD-10-CM

## 2024-07-08 ENCOUNTER — Other Ambulatory Visit (HOSPITAL_COMMUNITY)
Admission: RE | Admit: 2024-07-08 | Discharge: 2024-07-08 | Disposition: A | Discharge status: Home / Self Care | Source: Ambulatory Visit | Attending: Internal Medicine | Admitting: Internal Medicine

## 2024-07-08 ENCOUNTER — Other Ambulatory Visit: Payer: Self-pay | Admitting: Student

## 2024-07-08 ENCOUNTER — Other Ambulatory Visit: Payer: Self-pay

## 2024-07-08 ENCOUNTER — Ambulatory Visit

## 2024-07-08 VITALS — BP 152/94 | HR 71 | Temp 98.4°F | Ht 66.0 in | Wt 197.0 lb

## 2024-07-08 DIAGNOSIS — E755 Other lipid storage disorders: Secondary | ICD-10-CM | POA: Diagnosis not present

## 2024-07-08 DIAGNOSIS — G629 Polyneuropathy, unspecified: Secondary | ICD-10-CM | POA: Diagnosis not present

## 2024-07-08 DIAGNOSIS — D500 Iron deficiency anemia secondary to blood loss (chronic): Secondary | ICD-10-CM

## 2024-07-08 DIAGNOSIS — M6282 Rhabdomyolysis: Secondary | ICD-10-CM | POA: Diagnosis not present

## 2024-07-08 DIAGNOSIS — I10 Essential (primary) hypertension: Secondary | ICD-10-CM

## 2024-07-08 DIAGNOSIS — J310 Chronic rhinitis: Secondary | ICD-10-CM | POA: Insufficient documentation

## 2024-07-08 DIAGNOSIS — Z124 Encounter for screening for malignant neoplasm of cervix: Secondary | ICD-10-CM | POA: Diagnosis not present

## 2024-07-08 DIAGNOSIS — J3089 Other allergic rhinitis: Secondary | ICD-10-CM

## 2024-07-08 DIAGNOSIS — M79605 Pain in left leg: Secondary | ICD-10-CM | POA: Diagnosis not present

## 2024-07-08 DIAGNOSIS — M79604 Pain in right leg: Secondary | ICD-10-CM | POA: Diagnosis not present

## 2024-07-08 DIAGNOSIS — Z1159 Encounter for screening for other viral diseases: Secondary | ICD-10-CM

## 2024-07-08 DIAGNOSIS — R7303 Prediabetes: Secondary | ICD-10-CM

## 2024-07-08 DIAGNOSIS — H9190 Unspecified hearing loss, unspecified ear: Secondary | ICD-10-CM | POA: Diagnosis not present

## 2024-07-08 DIAGNOSIS — R5383 Other fatigue: Secondary | ICD-10-CM

## 2024-07-08 LAB — TB SKIN TEST
Induration: 0 mm
TB Skin Test: NEGATIVE

## 2024-07-08 MED ORDER — MECLIZINE HCL 25 MG PO TABS: 25.0000 mg | ORAL_TABLET | Freq: Three times a day (TID) | ORAL | 3 refills | Status: AC | PRN

## 2024-07-08 MED ORDER — DULOXETINE HCL 30 MG PO CPEP
30.0000 mg | ORAL_CAPSULE | Freq: Every day | ORAL | 2 refills | Status: AC
Start: 2024-07-08 — End: 2025-07-08

## 2024-07-08 MED ORDER — AMLODIPINE-OLMESARTAN 5-20 MG PO TABS
1.0000 | ORAL_TABLET | Freq: Every day | ORAL | 3 refills | Status: AC
Start: 2024-07-08 — End: ?

## 2024-07-08 MED ORDER — FERROUS SULFATE 325 (65 FE) MG PO TABS
325.0000 mg | ORAL_TABLET | Freq: Every day | ORAL | 3 refills | Status: AC
Start: 2024-07-08 — End: ?

## 2024-07-08 NOTE — Patient Instructions (Signed)
 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   I will put in prescription for duloxetine , you will take this daily.   We look forward to seeing you next time. Please call our clinic at [phone] if you have any questions or concerns. The best time to call is Monday-Friday from 9am-4pm, but there is someone available 24/7. If you need medication refills, please notify your pharmacy one week in advance and they will send us  a request.   Thank you for trusting me with your care. Wishing you the best!   Sallyanne Primas, DO  [facility]

## 2024-07-08 NOTE — Assessment & Plan Note (Addendum)
 Patient stated that her fatigue is somewhat resolved. TSH WNL and B12 WNL (checked at last appointment). Patient states she is sleeping well when she can get to sleep.

## 2024-07-08 NOTE — Assessment & Plan Note (Signed)
 Patient states that after her rhabdomyolysis she noticed that when she bends down her nose will start to run -patient instructed to take [facility] +/- Claritin daily and f/u

## 2024-07-08 NOTE — Progress Notes (Signed)
 Complete physical exam  Patient: Kayla Adkins   DOB: 08-Apr-1981   43 y.o. Female  MRN: [id number]  Subjective:    Chief Complaint  Patient presents with   Follow-up    Routine office visit for physical and pap smear / medication refill on all meds- did not take medication this morning    Kayla Adkins is a 43 y.o. female who presents today for a complete physical exam. She reports consuming a Pescatarian diet. Loves to cook at home. Every day will walk about 30 minutes a day, but past 2 weeks only twice a week. She generally feels well, the legs and feet are her biggest concern, however this is a chronic problem for her. She reports sleeping well. She does have additional problems to discuss today.    Most recent fall risk assessment:    06/10/2024    9:51 AM  Fall Risk   Falls in the past year? 0  Number falls in past yr: 0  Injury with Fall? 0  Risk for fall due to : No Fall Risks  Follow up Falls evaluation completed;Falls prevention discussed     Most recent depression screenings:    07/08/2024   10:42 AM 06/10/2024    9:51 AM  PHQ 2/9 Scores  PHQ - 2 Score 0 0    Reports having seen dentist and eye doctor within past year  Patient Care Team: Charmayne Holmes, DO as PCP - General   Outpatient Medications Prior to Visit  Medication Sig   amLODipine -olmesartan  (AZOR ) 5-20 MG tablet Take 1 tablet by mouth daily.   ferrous sulfate  325 (65 FE) MG tablet Take 1 tablet (325 mg total) by mouth daily.   meclizine  (ANTIVERT ) 25 MG tablet Take 1 tablet (25 mg total) by mouth 3 (three) times daily as needed for dizziness.   No facility-administered medications prior to visit.    Review of Systems  Constitutional:  Negative for chills, fever, malaise/fatigue and weight loss.  HENT:  Positive for congestion (when she bends down, she has rhinitis). Negative for hearing loss and tinnitus.   Respiratory:  Negative for cough and shortness of breath.   Cardiovascular:  Positive  for leg swelling (off/on ankles). Negative for chest pain.  Gastrointestinal:  Negative for abdominal pain, blood in stool, constipation, diarrhea, heartburn, melena, nausea and vomiting.  Genitourinary:  Negative for dysuria, frequency and hematuria.  Musculoskeletal:  Positive for myalgias (back of legs occassionally; running feeling constantly when she walks and goes it feels better. sometimes worse at night). Negative for joint pain.  Skin:  Negative for rash.  Neurological:  Negative for weakness and headaches.  Psychiatric/Behavioral:  Negative for depression and suicidal ideas. The patient is nervous/anxious (last panic attack 3 months ago). The patient does not have insomnia.         Objective:     BP (!) 152/94   Pulse 71   Temp 98.4 F (36.9 C) (Oral)   Ht 5' 6 (1.676 m)   Wt 197 lb (89.4 kg)   BMI 31.80 kg/m  BP Readings from Last 3 Encounters:  07/08/24 (!) 152/94  06/10/24 135/78  06/07/24 (!) 168/102       Physical Exam HENT:     Right Ear: There is no impacted cerumen.     Left Ear: There is no impacted cerumen.  Cardiovascular:     Rate and Rhythm: Normal rate and regular rhythm.     Heart sounds: Normal heart sounds.  Pulmonary:  Effort: Pulmonary effort is normal.     Breath sounds: Normal breath sounds. No stridor. No wheezing, rhonchi or rales.  Abdominal:     General: There is no distension.     Palpations: Abdomen is soft.     Tenderness: There is no abdominal tenderness. There is no guarding or rebound.  Genitourinary:    General: Normal vulva.  Musculoskeletal:        General: No swelling.     Right lower leg: No edema.     Left lower leg: No edema.  Skin:    General: Skin is warm and dry.     Comments: Bilateral xanthelasma     Neurological:     Mental Status: She is alert.      No results found for any visits on 07/08/24. Last lipids Lab Results  Component Value Date   CHOL 145 08/12/2023   HDL 43 08/12/2023   LDLCALC 85  08/12/2023   TRIG 92 08/12/2023   CHOLHDL 3.4 08/12/2023   Last hemoglobin A1c Lab Results  Component Value Date   HGBA1C 5.7 (A) 08/12/2023   Last thyroid  functions Lab Results  Component Value Date   TSH 1.080 06/10/2024        Assessment & Plan:    Routine Health Maintenance and Physical Exam  Immunization History  Administered Date(s) Administered   Influenza, Seasonal, Injecte, Preservative Fre 08/12/2023   Influenza,inj,Quad PF,6+ Mos 02/08/2020, 08/31/2021, 12/04/2022   PPD Test 04/24/2017, 06/18/2022, 07/06/2024   Tdap 12/26/2022    Health Maintenance  Topic Date Due   Hepatitis B Vaccines (1 of 3 - 19+ 3-dose series) Never done   HPV VACCINES (1 - 3-dose SCDM series) Never done   Cervical Cancer Screening (HPV/Pap Cotest)  Never done   COVID-19 Vaccine (1 - 2024-25 season) Never done   MAMMOGRAM  03/20/2024   INFLUENZA VACCINE  07/03/2024   DTaP/Tdap/Td (2 - Td or Tdap) 12/26/2032   Hepatitis C Screening  Completed   HIV Screening  Completed   Meningococcal B Vaccine  Aged Out    Discussed health benefits of physical activity, and encouraged her to engage in regular exercise appropriate for her age and condition. Assessment & Plan Neuropathy rhabdomyolysis Pain in both lower extremities Iron deficiency anemia due to chronic blood loss Last office visit 06/10/2024. Started on iron supplements and Mg and instructed to decrease salt intake, healthy diet, and leg elevation. B12 WNL. Patient noted that leg pain feels like her legs are constantly running. It is better when she moves around a bit. The pain is not generally worse at night, it is fairly constant. She states that she does not drink a lot of water during the day.  -started patient on Cymbalta  30 mg daily for MSK pain/neuropathic pain/anxiety.  -patient encouraged to hydrate more -patient stated she is taking her iron as instructed. Noticing darker stool as a result.  Subjective hearing  change Patient noted having to ask people to repeat themselves many times. Husband has also drawn attention to this. Patient thought her eyes could be clogged with wax. 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 Previous Lipid panel WNL.  -lipid panel ordered  -referral to plastic surgery for xanthoma removal/laser  Allergic rhinitis due to other allergic trigger, unspecified seasonality Patient states that after her rhabdomyolysis she noticed that when she bends down her nose will start to run -patient instructed to  take Flonaze +/- Claritin daily and f/u Need for hepatitis B screening test Patient states she does not remember being vaccinated for Hep B. Discussed with patient the benefit of being immunized for hep B such as preventing damage to liver. Patient agreeable to blood draw to test if previously immunized  -Hep B S antibody ordered Pap smear for cervical cancer screening Patient could not recall last pap smear date. Discussed with patient HPV vaccine. Patient was informed that she does not need HPV vaccine.  -received pap smear today. Sent for cytology and HPV testing.  Prediabetes Patient's last A1C in prediabetes range. Patient described her diet as Pescatarian Diet. Tries to walk daily.  -repeat A1C today Other fatigue Patient stated that her fatigue is somewhat resolved. TSH WNL and B12 WNL (checked at last appointment). Patient states she is sleeping well when she can get to sleep.   Return in about 6 months (around 01/08/2025).     Selena Swaminathan, DO

## 2024-07-08 NOTE — Assessment & Plan Note (Addendum)
 Last office visit 06/10/2024. Started on iron supplements and Mg and instructed to decrease salt intake, healthy diet, and leg elevation. B12 WNL. Patient noted that leg pain feels like her legs are constantly running. It is better when she moves around a bit. The pain is not generally worse at night, it is fairly constant. She states that she does not drink a lot of water during the day.  -started patient on Cymbalta  30 mg daily for MSK pain/neuropathic pain/anxiety.  -patient encouraged to hydrate more -patient stated she is taking her iron as instructed. Noticing darker stool as a result.

## 2024-07-08 NOTE — Assessment & Plan Note (Signed)
 Patient's last A1C in prediabetes range. Patient described her diet as Pescatarian Diet. Tries to walk daily.  -repeat A1C today

## 2024-07-08 NOTE — Assessment & Plan Note (Signed)
 Last office visit 06/10/2024. Started on iron supplements and Mg and instructed to decrease salt intake, healthy diet, and leg elevation. B12 WNL. Patient noted that leg pain feels like her legs are constantly running. It is better when she moves around a bit. The pain is not generally worse at night, it is fairly constant. She states that she does not drink a lot of water during the day.  -started patient on Cymbalta  30 mg daily for MSK pain/neuropathic pain/anxiety.  -patient encouraged to hydrate more -patient stated she is taking her iron as instructed. Noticing darker stool as a result.

## 2024-07-09 ENCOUNTER — Ambulatory Visit: Payer: Self-pay

## 2024-07-09 LAB — HEMOGLOBIN A1C
Est. average glucose Bld gHb Est-mCnc: 108 mg/dL
Hgb A1c MFr Bld: 5.4 % (ref 4.8–5.6)

## 2024-07-09 LAB — HEPATITIS B SURFACE ANTIBODY,QUALITATIVE: Hep B Surface Ab, Qual: NONREACTIVE

## 2024-07-09 LAB — LIPID PANEL
Chol/HDL Ratio: 3.6 ratio (ref 0.0–4.4)
Cholesterol, Total: 164 mg/dL (ref 100–199)
HDL: 45 mg/dL (ref >39)
LDL Chol Calc (NIH): 103 mg/dL — ABNORMAL HIGH (ref 0–99)
Triglycerides: 83 mg/dL (ref 0–149)
VLDL Cholesterol Cal: 16 mg/dL (ref 5–40)

## 2024-07-10 NOTE — Progress Notes (Signed)
 Internal Medicine Clinic Attending  I was physically present during the key portions of the resident provided service and participated in the medical decision making of patient's management care. I reviewed pertinent patient test results.  The assessment, diagnosis, and plan were formulated together and I agree with the documentation in the resident's note.  Referred to plastic surgery for discussion of cosmetic treatment of bilateral eyelid xanthomas per patient request. I was present for the duration of Pap smear and assisted with specimen collection. External and internal GU exam wnl. Cervical tissue appeared healthy and non-friable.   Karna Fellows, MD

## 2024-07-13 LAB — CYTOLOGY - PAP
Comment: NEGATIVE
Diagnosis: NEGATIVE
High risk HPV: NEGATIVE

## 2024-07-14 ENCOUNTER — Other Ambulatory Visit: Payer: Self-pay | Admitting: Student

## 2024-07-14 ENCOUNTER — Institutional Professional Consult (permissible substitution)

## 2024-07-14 MED ORDER — METRONIDAZOLE 500 MG PO TABS: 500.0000 mg | ORAL_TABLET | Freq: Two times a day (BID) | ORAL | 0 refills | Status: DC

## 2024-07-14 NOTE — Telephone Encounter (Signed)
 Spoke with patient over phone on 07/14/2024 at 3:45 regarding pap smear results.   Informed patient that she was negative for HPV or intraepithelial lesion/malignancy.   Informed patient that was she positive for trichomonas vaginalis. Stated that this is a sexually transmitted infection that can often lie dormant for long periods of time and is commonly asymptomatic, especially in males. Because of this, this is not an indicator of acute infidelity. Encouraged discussion with spouse/partner for them to be treated as well by talking with their primary care provider.  Informed patient Metronidazole  500 mg BID for 7 days would be sent to her pharmacy.   Informed patient if she became symptomatic to return to clinic.  Patient stated understanding and had no other questions or concerns at this time.   Sallyanne Primas, DO  [facility]

## 2024-07-15 ENCOUNTER — Other Ambulatory Visit: Payer: Self-pay

## 2024-07-15 ENCOUNTER — Other Ambulatory Visit: Payer: Self-pay | Admitting: Internal Medicine

## 2024-07-15 DIAGNOSIS — A5901 Trichomonal vulvovaginitis: Secondary | ICD-10-CM

## 2024-07-15 DIAGNOSIS — Z1231 Encounter for screening mammogram for malignant neoplasm of breast: Secondary | ICD-10-CM

## 2024-07-16 ENCOUNTER — Encounter

## 2024-07-16 DIAGNOSIS — Z1231 Encounter for screening mammogram for malignant neoplasm of breast: Secondary | ICD-10-CM

## 2024-07-17 ENCOUNTER — Encounter

## 2024-07-17 DIAGNOSIS — Z1231 Encounter for screening mammogram for malignant neoplasm of breast: Secondary | ICD-10-CM

## 2024-07-24 ENCOUNTER — Other Ambulatory Visit: Payer: Self-pay

## 2024-07-24 ENCOUNTER — Emergency Department (HOSPITAL_COMMUNITY)
Admission: EM | Admit: 2024-07-24 | Discharge: 2024-07-24 | Disposition: A | Discharge status: Home / Self Care | Attending: Emergency Medicine | Admitting: Emergency Medicine

## 2024-07-24 ENCOUNTER — Encounter (HOSPITAL_COMMUNITY): Payer: Self-pay | Admitting: *Deleted

## 2024-07-24 DIAGNOSIS — U071 COVID-19: Secondary | ICD-10-CM | POA: Diagnosis not present

## 2024-07-24 DIAGNOSIS — R109 Unspecified abdominal pain: Secondary | ICD-10-CM | POA: Diagnosis not present

## 2024-07-24 DIAGNOSIS — R519 Headache, unspecified: Secondary | ICD-10-CM | POA: Diagnosis present

## 2024-07-24 DIAGNOSIS — R110 Nausea: Secondary | ICD-10-CM | POA: Diagnosis not present

## 2024-07-24 LAB — COMPREHENSIVE METABOLIC PANEL WITH GFR
ALT: 12 U/L (ref 0–44)
AST: 16 U/L (ref 15–41)
Albumin: 3.9 g/dL (ref 3.5–5.0)
Alkaline Phosphatase: 71 U/L (ref 38–126)
Anion gap: 11 (ref 5–15)
BUN: 5 mg/dL — ABNORMAL LOW (ref 6–20)
CO2: 22 mmol/L (ref 22–32)
Calcium: 9 mg/dL (ref 8.9–10.3)
Chloride: 103 mmol/L (ref 98–111)
Creatinine, Ser: 0.79 mg/dL (ref 0.44–1.00)
GFR, Estimated: >60 mL/min (ref >60)
Glucose, Bld: 99 mg/dL (ref 70–99)
Potassium: 3 mmol/L — ABNORMAL LOW (ref 3.5–5.1)
Sodium: 136 mmol/L (ref 135–145)
Total Bilirubin: 0.7 mg/dL (ref 0.0–1.2)
Total Protein: 7.5 g/dL (ref 6.5–8.1)

## 2024-07-24 LAB — URINALYSIS, ROUTINE W REFLEX MICROSCOPIC
Bacteria, UA: NONE SEEN
Bilirubin Urine: NEGATIVE
Glucose, UA: NEGATIVE mg/dL
Ketones, ur: 5 mg/dL — AB
Leukocytes,Ua: NEGATIVE
Nitrite: NEGATIVE
Protein, ur: NEGATIVE mg/dL
Specific Gravity, Urine: 1.012 (ref 1.005–1.030)
pH: 9 — ABNORMAL HIGH (ref 5.0–8.0)

## 2024-07-24 LAB — CBC
HCT: 33.5 % — ABNORMAL LOW (ref 36.0–46.0)
Hemoglobin: 10.5 g/dL — ABNORMAL LOW (ref 12.0–15.0)
MCH: 28.1 pg (ref 26.0–34.0)
MCHC: 31.3 g/dL (ref 30.0–36.0)
MCV: 89.6 fL (ref 80.0–100.0)
Platelets: 311 K/uL (ref 150–400)
RBC: 3.74 MIL/uL — ABNORMAL LOW (ref 3.87–5.11)
RDW: 15.9 % — ABNORMAL HIGH (ref 11.5–15.5)
WBC: 5.6 K/uL (ref 4.0–10.5)
nRBC: 0 % (ref 0.0–0.2)

## 2024-07-24 LAB — HCG, SERUM, QUALITATIVE: Preg, Serum: NEGATIVE

## 2024-07-24 LAB — RESP PANEL BY RT-PCR (RSV, FLU A&B, COVID)  RVPGX2
Influenza A by PCR: NEGATIVE
Influenza B by PCR: NEGATIVE
Resp Syncytial Virus by PCR: NEGATIVE
SARS Coronavirus 2 by RT PCR: POSITIVE — AB

## 2024-07-24 LAB — CK: Total CK: 147 U/L (ref 38–234)

## 2024-07-24 LAB — LIPASE, BLOOD: Lipase: 28 U/L (ref 11–51)

## 2024-07-24 MED ORDER — POTASSIUM CHLORIDE CRYS ER 20 MEQ PO TBCR
20.0000 meq | EXTENDED_RELEASE_TABLET | Freq: Once | ORAL | Status: AC
  Administered 2024-07-24: 20 meq via ORAL
  Filled 2024-07-24: qty 1

## 2024-07-24 MED ORDER — METOCLOPRAMIDE HCL 5 MG/ML IJ SOLN
10.0000 mg | Freq: Once | INTRAMUSCULAR | Status: AC
  Administered 2024-07-24: 10 mg via INTRAVENOUS
  Filled 2024-07-24: qty 2

## 2024-07-24 MED ORDER — KETOROLAC TROMETHAMINE 15 MG/ML IJ SOLN
15.0000 mg | Freq: Once | INTRAMUSCULAR | Status: AC
  Administered 2024-07-24: 15 mg via INTRAVENOUS
  Filled 2024-07-24: qty 1

## 2024-07-24 MED ORDER — ONDANSETRON 4 MG PO TBDP
4.0000 mg | ORAL_TABLET | Freq: Once | ORAL | Status: AC | PRN
  Administered 2024-07-24: 4 mg via ORAL
  Filled 2024-07-24: qty 1

## 2024-07-24 MED ORDER — LACTATED RINGERS IV BOLUS
1000.0000 mL | Freq: Once | INTRAVENOUS | Status: AC
  Administered 2024-07-24: 1000 mL via INTRAVENOUS

## 2024-07-24 MED ORDER — ACETAMINOPHEN 325 MG PO TABS
650.0000 mg | ORAL_TABLET | Freq: Once | ORAL | Status: AC
  Administered 2024-07-24: 650 mg via ORAL
  Filled 2024-07-24: qty 2

## 2024-07-24 NOTE — ED Triage Notes (Signed)
 The pt has been ill since last night c/o abd pain and nausea with feet and leg swelling lmp 7-22

## 2024-07-24 NOTE — ED Provider Notes (Signed)
 [place] EMERGENCY DEPARTMENT AT [facility] Provider Note   CSN: [id number] Arrival date & time: 07/24/24  1535     Patient presents with: Abdominal Pain   Kayla Adkins is a 43 y.o. female past medical history significant for history of rhabdomyolysis who presents emergency department for acute headache and generalized muscle aches.  Patient states that over the past 24 to 48 hours she has experienced muscle aches in her bilateral upper and lower extremities, headache that has gradually been worsening and associated nasal congestion and rhinorrhea with generalized abdominal pain.  Patient states that she has had decreased p.o. intake however has not had episodes of emesis.  Patient denies urinary symptoms including dysuria, increased urinary frequency or urgency.    Abdominal Pain      Prior to Admission medications   Medication Sig Start Date End Date Taking? Authorizing Provider  amLODipine -olmesartan  (AZOR ) 5-20 MG tablet Take 1 tablet by mouth daily. 07/08/24   Marylu Gee, DO  DULoxetine  (CYMBALTA ) 30 MG capsule Take 1 capsule (30 mg total) by mouth daily. 07/08/24 07/08/25  Marylu Gee, DO  ferrous sulfate  325 (65 FE) MG tablet Take 1 tablet (325 mg total) by mouth daily. 07/08/24   Marylu Gee, DO  meclizine  (ANTIVERT ) 25 MG tablet Take 1 tablet (25 mg total) by mouth 3 (three) times daily as needed for dizziness. 07/08/24   Marylu Gee, DO  metroNIDAZOLE  (FLAGYL ) 500 MG tablet Take 1 tablet (500 mg total) by mouth 2 (two) times daily. 07/14/24   Marylu Gee, DO    Allergies: Patient has no known allergies.    Review of Systems  Gastrointestinal:  Positive for abdominal pain.    Updated Vital Signs BP 118/62   Pulse 82   Temp 100.2 F (37.9 C) (Oral)   Resp 16   Ht 5' 6 (1.676 m)   Wt 89.4 kg   LMP 06/23/2024   SpO2 100%   BMI 31.81 kg/m   Physical Exam Vitals reviewed.  Constitutional:      General: She is not in acute distress.     Appearance: She is not ill-appearing.  HENT:     Head: Normocephalic.  Cardiovascular:     Rate and Rhythm: Normal rate and regular rhythm.     Pulses:          Radial pulses are 2+ on the left side.     Heart sounds: Normal heart sounds. No murmur heard. Pulmonary:     Effort: Pulmonary effort is normal. No tachypnea.  Abdominal:     General: There is no distension.     Palpations: Abdomen is soft.     Tenderness: There is generalized abdominal tenderness.  Musculoskeletal:     Cervical back: Full passive range of motion without pain and normal range of motion.     Right lower leg: No edema.     Left lower leg: No edema.  Neurological:     Mental Status: She is alert.     (all labs ordered are listed, but only abnormal results are displayed) Labs Reviewed  RESP PANEL BY RT-PCR (RSV, FLU A&B, COVID)  RVPGX2 - Abnormal; Notable for the following components:      Result Value   SARS Coronavirus 2 by RT PCR POSITIVE (*)    All other components within normal limits  COMPREHENSIVE METABOLIC PANEL WITH GFR - Abnormal; Notable for the following components:   Potassium 3.0 (*)    BUN 5 (*)    All  other components within normal limits  CBC - Abnormal; Notable for the following components:   RBC 3.74 (*)    Hemoglobin 10.5 (*)    HCT 33.5 (*)    RDW 15.9 (*)    All other components within normal limits  URINALYSIS, ROUTINE W REFLEX MICROSCOPIC - Abnormal; Notable for the following components:   pH 9.0 (*)    Hgb urine dipstick SMALL (*)    Ketones, ur 5 (*)    All other components within normal limits  LIPASE, BLOOD  HCG, SERUM, QUALITATIVE  CK    EKG: None  Radiology: No results found.   Procedures   Medications Ordered in the ED  ondansetron  (ZOFRAN -ODT) disintegrating tablet 4 mg (4 mg Oral Given 07/24/24 1603)  lactated ringers  bolus 1,000 mL (0 mLs Intravenous Stopped 07/24/24 2114)  acetaminophen  (TYLENOL ) tablet 650 mg (650 mg Oral Given 07/24/24 1914)   ketorolac  (TORADOL ) 15 MG/ML injection 15 mg (15 mg Intravenous Given 07/24/24 1917)  metoCLOPramide  (REGLAN ) injection 10 mg (10 mg Intravenous Given 07/24/24 1917)  potassium chloride  SA (KLOR-CON  M) CR tablet 20 mEq (20 mEq Oral Given 07/24/24 1914)                                    Medical Decision Making Amount and/or Complexity of Data Reviewed Labs: ordered.  Risk OTC drugs. Prescription drug management.   On initial evaluation patient is hemodynamically stable, afebrile and is uncomfortable appearing however not in acute distress.  Laboratory studies obtained prior to initial evaluation with evidence of respiratory panel positive for COVID.  No evidence of leukocytosis or significant anemia, no significant electrolyte abnormalities or renal impairment and urinalysis without evidence of infection.  Based on patient's history and physical examination findings differential diagnosis includes acute viral illness, gastroenteritis, acute intractable versus non-intractable headache, CVA/TIA, rhabdomyolysis.  As patient has had decreased p.o. intake will give fluid bolus and give migraine cocktail for acute headache.  As patient has no focal neurologic deficits do not believe CVA/TIA is etiology of patient's presentation there was no believe further imaging is necessary at this time.  CK within normal limits and no renal impairment therefore rhabdomyolysis less likely.  On reevaluation patient's headache has completely resolved and patient states that she is feeling much better after fluid bolus and headache cocktail.  At this time believe patient is safe to be discharged home with strict return precautions and outpatient follow-up     Final diagnoses:  COVID    ED Discharge Orders     None       Lavanda Bolster DO Emergency Medicine PGY2    Bolster Lavanda, DO 07/24/24 2249    Randol Simmonds, MD 07/25/24 [phone]

## 2024-07-24 NOTE — Discharge Instructions (Signed)
 Thank you for allowing us  to care for you today.  You came to emergency department for headache and muscle aches.  Your laboratory studies were positive for COVID.  Please be sure to follow-up with your primary care provider.  Please return to the emergency department if you experience worsening pain, chest pain, shortness of breath, passout or believe you need emergent medical care.

## 2024-09-01 ENCOUNTER — Telehealth: Payer: Self-pay | Admitting: *Deleted

## 2024-09-01 NOTE — Telephone Encounter (Signed)
 Mammogram appts all canceled August 15-2025 @ 12:40 pm / August 14-2025 @ 4:30 pm / August 12-2023 @ 4:40 pm / all appointments were My Chart direct scheduling.

## 2024-10-04 ENCOUNTER — Emergency Department (HOSPITAL_COMMUNITY)
Admission: EM | Admit: 2024-10-04 | Discharge: 2024-10-04 | Disposition: A | Discharge status: Home / Self Care | Attending: Emergency Medicine | Admitting: Emergency Medicine

## 2024-10-04 ENCOUNTER — Emergency Department (HOSPITAL_COMMUNITY)

## 2024-10-04 ENCOUNTER — Encounter (HOSPITAL_COMMUNITY): Payer: Self-pay | Admitting: *Deleted

## 2024-10-04 ENCOUNTER — Other Ambulatory Visit: Payer: Self-pay

## 2024-10-04 DIAGNOSIS — M79601 Pain in right arm: Secondary | ICD-10-CM | POA: Diagnosis not present

## 2024-10-04 DIAGNOSIS — M5412 Radiculopathy, cervical region: Secondary | ICD-10-CM | POA: Diagnosis not present

## 2024-10-04 DIAGNOSIS — M542 Cervicalgia: Secondary | ICD-10-CM | POA: Diagnosis present

## 2024-10-04 MED ORDER — KETOROLAC TROMETHAMINE 15 MG/ML IJ SOLN
15.0000 mg | Freq: Once | INTRAMUSCULAR | Status: AC
  Administered 2024-10-04: 15 mg via INTRAMUSCULAR
  Filled 2024-10-04: qty 1

## 2024-10-04 MED ORDER — METHYLPREDNISOLONE ACETATE 40 MG/ML IJ SUSP
40.0000 mg | Freq: Once | INTRAMUSCULAR | Status: AC
  Administered 2024-10-04: 40 mg via INTRAMUSCULAR
  Filled 2024-10-04: qty 1

## 2024-10-04 MED ORDER — METHOCARBAMOL 500 MG PO TABS: 500.0000 mg | ORAL_TABLET | Freq: Two times a day (BID) | ORAL | 0 refills | Status: AC

## 2024-10-04 MED ORDER — PREDNISONE 10 MG (21) PO TBPK: ORAL_TABLET | Freq: Every day | ORAL | 0 refills | Status: AC

## 2024-10-04 NOTE — ED Triage Notes (Signed)
 Pt reports right arm pain x 3 weeks.  Reports an injury but that was years ago  Hurts either at rest or with activity.

## 2024-10-04 NOTE — Discharge Instructions (Addendum)
 Take prednisone  as prescribed and complete the full course. Take Robaxin  as needed as prescribed, do not driver or operate machinery while taking Robaxin . You can apply a warm compress to your neck/shoulder for 20 minutes and then follow with gentle range of motion exercises (shoulder shrugs, head rolls). Follow up with neurology, call to schedule an appointment.

## 2024-10-04 NOTE — ED Provider Notes (Signed)
 [place] EMERGENCY DEPARTMENT AT [facility] Provider Note   CSN: [id number] Arrival date & time: 10/04/24  2006     Patient presents with: Arm Pain   Kayla Adkins is a 43 y.o. female.  {Add pertinent medical, surgical, social history, OB history to HPI:823} 43 year old female presents with complaint of pain in her right arm. States started about 3 weeks ago without injury with pain in the right anterior shoulder. Pain moved up into the right side of her neck and down her right arm to her thumb. Reports altered sensation along this pain area, pain is sharp in nature, worse with movement. Patient is right hand dominant, feels like she has some right arm/hand weakness.        Prior to Admission medications   Medication Sig Start Date End Date Taking? Authorizing Provider  amLODipine -olmesartan  (AZOR ) 5-20 MG tablet Take 1 tablet by mouth daily. 07/08/24   Marylu Gee, DO  DULoxetine  (CYMBALTA ) 30 MG capsule Take 1 capsule (30 mg total) by mouth daily. 07/08/24 07/08/25  Marylu Gee, DO  ferrous sulfate  325 (65 FE) MG tablet Take 1 tablet (325 mg total) by mouth daily. 07/08/24   Marylu Gee, DO  meclizine  (ANTIVERT ) 25 MG tablet Take 1 tablet (25 mg total) by mouth 3 (three) times daily as needed for dizziness. 07/08/24   Marylu Gee, DO  metroNIDAZOLE  (FLAGYL ) 500 MG tablet Take 1 tablet (500 mg total) by mouth 2 (two) times daily. 07/14/24   Marylu Gee, DO    Allergies: Patient has no known allergies.    Review of Systems Negative except as per HPI Updated Vital Signs BP (!) 148/96 (BP Location: Right Arm)   Pulse 80   Temp 98.1 F (36.7 C)   Resp 18   Ht 5' 6 (1.676 m)   Wt 89.4 kg   LMP 09/28/2024   SpO2 98%   BMI 31.81 kg/m   Physical Exam Vitals and nursing note reviewed.  Constitutional:      General: She is not in acute distress.    Appearance: She is well-developed. She is not diaphoretic.  HENT:     Head: Normocephalic and atraumatic.   Cardiovascular:     Pulses: Normal pulses.  Pulmonary:     Effort: Pulmonary effort is normal.  Musculoskeletal:        General: Tenderness present. No swelling, deformity or signs of injury.       Arms:     Cervical back: Tenderness present. No bony tenderness.     Thoracic back: No tenderness or bony tenderness.       Back:     Comments: 2+ radial pulse, reports diminished sensation along biceps area extends down along the radius to the thumb.  Equal grip strength, wrist strength, forearm and shoulder strength.  Skin:    General: Skin is warm and dry.     Findings: No bruising, erythema or rash.  Neurological:     Mental Status: She is alert and oriented to person, place, and time.     Sensory: Sensory deficit present.     Motor: No weakness.  Psychiatric:        Behavior: Behavior normal.     (all labs ordered are listed, but only abnormal results are displayed) Labs Reviewed - No data to display  EKG: None  Radiology: No results found.  {Document cardiac monitor, telemetry assessment procedure when appropriate:32947} Procedures   Medications Ordered in the ED  methylPREDNISolone  acetate (DEPO-MEDROL ) injection  40 mg (has no administration in time range)  ketorolac  (TORADOL ) 15 MG/ML injection 15 mg (has no administration in time range)      {Click here for ABCD2, HEART and other calculators REFRESH Note before signing:1}                              Medical Decision Making Amount and/or Complexity of Data Reviewed Radiology: ordered.  Risk Prescription drug management.   ***  {Document critical care time when appropriate  Document review of labs and clinical decision tools ie CHADS2VASC2, etc  Document your independent review of radiology images and any outside records  Document your discussion with family members, caretakers and with consultants  Document social determinants of health affecting pt's care  Document your decision making why or why not  admission, treatments were needed:32947:::1}   Final diagnoses:  None    ED Discharge Orders     None

## 2024-10-20 ENCOUNTER — Inpatient Hospital Stay: Payer: Self-pay | Admitting: Student

## 2024-10-20 NOTE — Progress Notes (Deleted)
 CC: ***  HPI:  [name] is a 43 y.o. female living with a history stated below and presents today for ***. Please see problem based assessment and plan for additional details.  Past Medical History:  Diagnosis Date   Hypertension    Rhabdomyolysis 07/16/2018   Vertigo    Viral myositis 10/03/2018    Current Outpatient Medications on File Prior to Visit  Medication Sig Dispense Refill   amLODipine -olmesartan  (AZOR ) 5-20 MG tablet Take 1 tablet by mouth daily. 90 tablet 3   DULoxetine  (CYMBALTA ) 30 MG capsule Take 1 capsule (30 mg total) by mouth daily. 30 capsule 2   ferrous sulfate  325 (65 FE) MG tablet Take 1 tablet (325 mg total) by mouth daily. 90 tablet 3   meclizine  (ANTIVERT ) 25 MG tablet Take 1 tablet (25 mg total) by mouth 3 (three) times daily as needed for dizziness. 30 tablet 3   methocarbamol  (ROBAXIN ) 500 MG tablet Take 1 tablet (500 mg total) by mouth 2 (two) times daily. 20 tablet 0   predniSONE  (STERAPRED UNI-PAK 21 TAB) 10 MG (21) TBPK tablet Take by mouth daily. Take 6 tabs by mouth daily  for 2 days, then 5 tabs for 2 days, then 4 tabs for 2 days, then 3 tabs for 2 days, 2 tabs for 2 days, then 1 tab by mouth daily for 2 days 42 tablet 0   No current facility-administered medications on file prior to visit.    No family history on file.  Social History   Socioeconomic History   Marital status: Married    Spouse name: Not on file   Number of children: Not on file   Years of education: Not on file   Highest education level: Not on file  Occupational History   Not on file  Tobacco Use   Smoking status: Former    Current packs/day: 0.00    Average packs/day: 0.3 packs/day for 2.0 years (0.5 ttl pk-yrs)    Types: Cigarettes    Start date: 08/06/2011    Quit date: 08/05/2013    Years since quitting: 11.2   Smokeless tobacco: Never  Substance and Sexual Activity   Alcohol use: Yes    Comment: sometimes   Drug use: No   Sexual activity: Not on  file  Other Topics Concern   Not on file  Social History Narrative   Not on file   Social Drivers of Health   Financial Resource Strain: Low Risk  (07/08/2024)   Overall Financial Resource Strain (CARDIA)    Difficulty of Paying Living Expenses: Not very hard  Food Insecurity: No Food Insecurity (07/08/2024)   Hunger Vital Sign    Worried About Running Out of Food in the Last Year: Never true    Ran Out of Food in the Last Year: Never true  Transportation Needs: No Transportation Needs (07/08/2024)   PRAPARE - Administrator, Civil Service (Medical): No    Lack of Transportation (Non-Medical): No  Physical Activity: Insufficiently Active (07/08/2024)   Exercise Vital Sign    Days of Exercise per Week: 2 days    Minutes of Exercise per Session: 30 min  Stress: No Stress Concern Present (07/08/2024)   [employer] of Occupational Health - Occupational Stress Questionnaire    Feeling of Stress: Only a little  Social Connections: Moderately Integrated (07/08/2024)   Social Connection and Isolation Panel    Frequency of Communication with Friends and Family: More than three times a week  Frequency of Social Gatherings with Friends and Family: More than three times a week    Attends Religious Services: More than 4 times per year    Active Member of [employer] or Organizations: No    Attends Banker Meetings: Never    Marital Status: Married  Catering Manager Violence: Not At Risk (07/08/2024)   Humiliation, Afraid, Rape, and Kick questionnaire    Fear of Current or Ex-Partner: No    Emotionally Abused: No    Physically Abused: No    Sexually Abused: No    Review of Systems: ROS negative except for what is noted on the assessment and plan.  There were no vitals filed for this visit.  Physical Exam: Constitutional: well-appearing *** sitting in ***, in no acute distress HENT: normocephalic atraumatic, mucous membranes moist Eyes: conjunctiva  non-erythematous Cardiovascular: regular rate and rhythm, no m/r/g Pulmonary/Chest: normal work of breathing on room air, lungs clear to auscultation bilaterally Abdominal: soft, non-tender, non-distended MSK: normal bulk and tone Neurological: alert & oriented x 3, no focal deficit Skin: warm and dry Psych: normal mood and behavior  Assessment & Plan:   No problem-specific Assessment & Plan notes found for this encounter.     Patient {GC/GE:[id number]::discussed with,seen with} Dr. {WJFZD:[id number]::Tpoopjfd,Z. Hoffman,Mullen,Narendra,Vincent,Guilloud,Lau,Machen}    Drue Grow, M.D [facility] Internal [facility] Phone: [phone] Date 10/20/2024 Time 2:14 PM

## 2024-10-20 NOTE — Patient Instructions (Incomplete)
 Thank you, Ms.Aerika Rothrock for allowing us  to provide your care today. Today we discussed ***.    I have ordered the following labs for you:  Lab Orders  No laboratory test(s) ordered today     Tests ordered today:  ***  Referrals ordered today:   Referral Orders  No referral(s) requested today     I have ordered the following medication/changed the following medications:   Stop the following medications: There are no discontinued medications.   Start the following medications: No orders of the defined types were placed in this encounter.    Follow up: {CHL AMB PED TIME TO FOLLOW-UP:[phone]}   Remember: ***  Should you have any questions or concerns please call the internal medicine clinic at [phone].   Drue Lisa Grow MD 10/20/2024, 2:11 PM   [facility]
